# Patient Record
Sex: Male | Born: 1935 | Race: Black or African American | Hispanic: No | Marital: Married | Smoking: Former smoker
Health system: Southern US, Community
[De-identification: ages and names within clinical notes are randomized; demographics above are authoritative.]

## PROBLEM LIST (undated history)

## (undated) DIAGNOSIS — I5189 Other ill-defined heart diseases: Secondary | ICD-10-CM

## (undated) DIAGNOSIS — R55 Syncope and collapse: Secondary | ICD-10-CM

## (undated) DIAGNOSIS — I519 Heart disease, unspecified: Secondary | ICD-10-CM

## (undated) DIAGNOSIS — F99 Mental disorder, not otherwise specified: Secondary | ICD-10-CM

## (undated) DIAGNOSIS — F0390 Unspecified dementia without behavioral disturbance: Secondary | ICD-10-CM

## (undated) DIAGNOSIS — N400 Enlarged prostate without lower urinary tract symptoms: Secondary | ICD-10-CM

## (undated) DIAGNOSIS — I679 Cerebrovascular disease, unspecified: Secondary | ICD-10-CM

## (undated) DIAGNOSIS — I10 Essential (primary) hypertension: Secondary | ICD-10-CM

## (undated) HISTORY — PX: BREAST SURGERY: SHX581

---

## 1997-08-02 ENCOUNTER — Other Ambulatory Visit: Admission: RE | Admit: 1997-08-02 | Discharge: 1997-08-02 | Payer: Self-pay | Admitting: Internal Medicine

## 1998-05-21 ENCOUNTER — Emergency Department (HOSPITAL_COMMUNITY): Admission: EM | Admit: 1998-05-21 | Discharge: 1998-05-21 | Payer: Self-pay

## 1998-05-29 ENCOUNTER — Emergency Department (HOSPITAL_COMMUNITY): Admission: EM | Admit: 1998-05-29 | Discharge: 1998-05-29 | Payer: Self-pay | Admitting: Emergency Medicine

## 1998-07-31 ENCOUNTER — Emergency Department (HOSPITAL_COMMUNITY): Admission: EM | Admit: 1998-07-31 | Discharge: 1998-07-31 | Payer: Self-pay | Admitting: *Deleted

## 1998-08-07 ENCOUNTER — Emergency Department (HOSPITAL_COMMUNITY): Admission: EM | Admit: 1998-08-07 | Discharge: 1998-08-07 | Payer: Self-pay | Admitting: Emergency Medicine

## 1998-09-10 ENCOUNTER — Emergency Department (HOSPITAL_COMMUNITY): Admission: EM | Admit: 1998-09-10 | Discharge: 1998-09-10 | Payer: Self-pay

## 1999-05-02 ENCOUNTER — Emergency Department (HOSPITAL_COMMUNITY): Admission: EM | Admit: 1999-05-02 | Discharge: 1999-05-03 | Payer: Self-pay | Admitting: Emergency Medicine

## 1999-05-03 ENCOUNTER — Encounter: Payer: Self-pay | Admitting: Emergency Medicine

## 2006-01-01 ENCOUNTER — Ambulatory Visit (HOSPITAL_COMMUNITY): Admission: RE | Admit: 2006-01-01 | Discharge: 2006-01-01 | Payer: Self-pay | Admitting: Pediatrics

## 2006-01-10 ENCOUNTER — Ambulatory Visit (HOSPITAL_COMMUNITY): Admission: RE | Admit: 2006-01-10 | Discharge: 2006-01-10 | Payer: Self-pay | Admitting: Internal Medicine

## 2007-09-12 ENCOUNTER — Emergency Department (HOSPITAL_COMMUNITY): Admission: EM | Admit: 2007-09-12 | Discharge: 2007-09-12 | Payer: Self-pay | Admitting: Emergency Medicine

## 2008-12-06 ENCOUNTER — Emergency Department (HOSPITAL_COMMUNITY): Admission: EM | Admit: 2008-12-06 | Discharge: 2008-12-06 | Payer: Self-pay | Admitting: Emergency Medicine

## 2010-09-01 ENCOUNTER — Emergency Department (HOSPITAL_COMMUNITY)
Admission: EM | Admit: 2010-09-01 | Discharge: 2010-09-02 | Disposition: A | Discharge status: Home / Self Care | Payer: Medicare Other | Attending: Emergency Medicine | Admitting: Emergency Medicine

## 2010-09-01 ENCOUNTER — Emergency Department (HOSPITAL_COMMUNITY): Payer: Medicare Other

## 2010-09-01 DIAGNOSIS — R112 Nausea with vomiting, unspecified: Secondary | ICD-10-CM | POA: Insufficient documentation

## 2010-09-01 DIAGNOSIS — Z79899 Other long term (current) drug therapy: Secondary | ICD-10-CM | POA: Insufficient documentation

## 2010-09-01 DIAGNOSIS — R55 Syncope and collapse: Secondary | ICD-10-CM | POA: Insufficient documentation

## 2010-09-01 DIAGNOSIS — R0682 Tachypnea, not elsewhere classified: Secondary | ICD-10-CM | POA: Insufficient documentation

## 2010-09-01 DIAGNOSIS — F0280 Dementia in other diseases classified elsewhere without behavioral disturbance: Secondary | ICD-10-CM | POA: Insufficient documentation

## 2010-09-01 DIAGNOSIS — F068 Other specified mental disorders due to known physiological condition: Secondary | ICD-10-CM | POA: Insufficient documentation

## 2010-09-01 DIAGNOSIS — G309 Alzheimer's disease, unspecified: Secondary | ICD-10-CM | POA: Insufficient documentation

## 2010-09-01 DIAGNOSIS — Z8673 Personal history of transient ischemic attack (TIA), and cerebral infarction without residual deficits: Secondary | ICD-10-CM | POA: Insufficient documentation

## 2010-09-01 DIAGNOSIS — D649 Anemia, unspecified: Secondary | ICD-10-CM | POA: Insufficient documentation

## 2010-09-02 LAB — DIFFERENTIAL
Basophils Absolute: 0 10*3/uL (ref 0.0–0.1)
Basophils Relative: 0 % (ref 0–1)
Eosinophils Absolute: 0 10*3/uL (ref 0.0–0.7)
Eosinophils Relative: 0 % (ref 0–5)
Lymphocytes Relative: 27 % (ref 12–46)
Lymphs Abs: 0.8 K/uL (ref 0.7–4.0)
Monocytes Absolute: 0.4 K/uL (ref 0.1–1.0)
Monocytes Relative: 12 % (ref 3–12)
Neutro Abs: 1.9 10*3/uL (ref 1.7–7.7)
Neutrophils Relative %: 61 % (ref 43–77)

## 2010-09-02 LAB — POCT I-STAT, CHEM 8
Calcium, Ion: 1.12 mmol/L (ref 1.12–1.32)
Chloride: 102 mEq/L (ref 96–112)
HCT: 35 % — ABNORMAL LOW (ref 39.0–52.0)
Potassium: 3.2 mEq/L — ABNORMAL LOW (ref 3.5–5.1)
Sodium: 141 mEq/L (ref 135–145)

## 2010-09-02 LAB — URINALYSIS, ROUTINE W REFLEX MICROSCOPIC
Bilirubin Urine: NEGATIVE
Glucose, UA: NEGATIVE mg/dL
Hgb urine dipstick: NEGATIVE
Ketones, ur: NEGATIVE mg/dL
Leukocytes, UA: NEGATIVE
Nitrite: NEGATIVE
Protein, ur: NEGATIVE mg/dL
Specific Gravity, Urine: 1.02 (ref 1.005–1.030)
Urobilinogen, UA: 0.2 mg/dL (ref 0.0–1.0)
pH: 6 (ref 5.0–8.0)

## 2010-09-02 LAB — PROTIME-INR
INR: 0.97 (ref 0.00–1.49)
Prothrombin Time: 13.1 seconds (ref 11.6–15.2)

## 2010-09-02 LAB — POCT I-STAT TROPONIN I: Troponin i, poc: 0 ng/mL (ref 0.00–0.08)

## 2010-09-02 LAB — LIPASE, BLOOD: Lipase: 21 U/L (ref 11–59)

## 2010-09-02 LAB — COMPREHENSIVE METABOLIC PANEL WITH GFR
Albumin: 3.6 g/dL (ref 3.5–5.2)
Alkaline Phosphatase: 41 U/L (ref 39–117)
BUN: 18 mg/dL (ref 6–23)
Chloride: 103 meq/L (ref 96–112)
Creatinine, Ser: 0.98 mg/dL (ref 0.50–1.35)
GFR calc Af Amer: >60 mL/min (ref >60)
GFR calc non Af Amer: >60 mL/min (ref >60)
Glucose, Bld: 113 mg/dL — ABNORMAL HIGH (ref 70–99)
Potassium: 3.3 meq/L — ABNORMAL LOW (ref 3.5–5.1)
Total Bilirubin: 0.2 mg/dL — ABNORMAL LOW (ref 0.3–1.2)

## 2010-09-02 LAB — CBC
HCT: 30.3 % — ABNORMAL LOW (ref 39.0–52.0)
Hemoglobin: 10.1 g/dL — ABNORMAL LOW (ref 13.0–17.0)
MCH: 28.2 pg (ref 26.0–34.0)
MCHC: 33.3 g/dL (ref 30.0–36.0)
MCV: 84.6 fL (ref 78.0–100.0)
Platelets: 113 10*3/uL — ABNORMAL LOW (ref 150–400)
RBC: 3.58 MIL/uL — ABNORMAL LOW (ref 4.22–5.81)
RDW: 13.9 % (ref 11.5–15.5)
WBC: 3.1 K/uL — ABNORMAL LOW (ref 4.0–10.5)

## 2010-09-02 LAB — COMPREHENSIVE METABOLIC PANEL
ALT: 8 U/L (ref 0–53)
AST: 14 U/L (ref 0–37)
CO2: 28 mEq/L (ref 19–32)
Calcium: 9.6 mg/dL (ref 8.4–10.5)
Sodium: 140 mEq/L (ref 135–145)
Total Protein: 6.8 g/dL (ref 6.0–8.3)

## 2010-09-02 LAB — TROPONIN I: Troponin I: <0.3 ng/mL (ref <0.30)

## 2010-09-02 LAB — D-DIMER, QUANTITATIVE: D-Dimer, Quant: 0.41 ug{FEU}/mL (ref 0.00–0.48)

## 2010-09-02 LAB — APTT: aPTT: 32 seconds (ref 24–37)

## 2010-09-19 ENCOUNTER — Ambulatory Visit (HOSPITAL_COMMUNITY)
Admission: RE | Admit: 2010-09-19 | Discharge: 2010-09-19 | Disposition: A | Discharge status: Home / Self Care | Payer: Medicare Other | Source: Ambulatory Visit | Attending: Internal Medicine | Admitting: Internal Medicine

## 2010-09-19 ENCOUNTER — Ambulatory Visit (HOSPITAL_COMMUNITY): Payer: Medicare Other

## 2010-09-19 DIAGNOSIS — I359 Nonrheumatic aortic valve disorder, unspecified: Secondary | ICD-10-CM | POA: Insufficient documentation

## 2010-09-19 DIAGNOSIS — I6529 Occlusion and stenosis of unspecified carotid artery: Secondary | ICD-10-CM | POA: Insufficient documentation

## 2010-09-19 DIAGNOSIS — R0989 Other specified symptoms and signs involving the circulatory and respiratory systems: Secondary | ICD-10-CM

## 2010-09-19 DIAGNOSIS — I079 Rheumatic tricuspid valve disease, unspecified: Secondary | ICD-10-CM | POA: Insufficient documentation

## 2010-09-19 DIAGNOSIS — H3400 Transient retinal artery occlusion, unspecified eye: Secondary | ICD-10-CM | POA: Insufficient documentation

## 2010-09-19 DIAGNOSIS — I369 Nonrheumatic tricuspid valve disorder, unspecified: Secondary | ICD-10-CM

## 2010-12-10 ENCOUNTER — Encounter (HOSPITAL_COMMUNITY): Payer: Self-pay | Admitting: Pharmacy Technician

## 2010-12-11 ENCOUNTER — Encounter (HOSPITAL_COMMUNITY)
Admission: RE | Admit: 2010-12-11 | Discharge: 2010-12-11 | Disposition: A | Discharge status: Home / Self Care | Payer: Medicare Other | Source: Ambulatory Visit | Attending: Ophthalmology | Admitting: Ophthalmology

## 2010-12-11 ENCOUNTER — Encounter (HOSPITAL_COMMUNITY): Payer: Self-pay

## 2010-12-11 HISTORY — DX: Mental disorder, not otherwise specified: F99

## 2010-12-11 LAB — BASIC METABOLIC PANEL
CO2: 33 mEq/L — ABNORMAL HIGH (ref 19–32)
Calcium: 9.6 mg/dL (ref 8.4–10.5)
Sodium: 143 mEq/L (ref 135–145)

## 2010-12-11 LAB — SURGICAL PCR SCREEN: Staphylococcus aureus: NEGATIVE

## 2010-12-11 LAB — CBC
MCV: 87.1 fL (ref 78.0–100.0)
Platelets: 148 10*3/uL — ABNORMAL LOW (ref 150–400)
RBC: 3.87 MIL/uL — ABNORMAL LOW (ref 4.22–5.81)
WBC: 2.9 10*3/uL — ABNORMAL LOW (ref 4.0–10.5)

## 2010-12-11 NOTE — Pre-Procedure Instructions (Signed)
20 Cesar Maldonado  12/11/2010   Your procedure is scheduled on:12/19/10 Report to [facility] at900 AM.  Call this number if you have problems the morning of surgery: [phone]   Remember:   Do not eat food:After Midnight.  May have clear liquids: up to 4 Hours before arrival.  Clear liquids include soda, tea, black coffee, apple or grape juice, broth.  Take these medicines the morning of surgery with A SIP OF WATER:namendia*stop asa now   Do not wear jewelry, make-up or nail polish.  Do not wear lotions, powders, or perfumes. You may wear deodorant.  Do not shave 48 hours prior to surgery.  Do not bring valuables to the hospital.  Contacts, dentures or bridgework may not be worn into surgery.  Leave suitcase in the car. After surgery it may be brought to your room.  For patients admitted to the hospital, checkout time is 11:00 AM the day of discharge.   Patients discharged the day of surgery will not be allowed to drive home.  Name and phone number of your driver: delores will bring number  Special Instructions: CHG Shower Use Special Wash: 1/2 bottle night before surgery and 1/2 bottle morning of surgery.   Please read over the following fact sheets that you were given: Pain Booklet, Coughing and Deep Breathing, MRSA Information and Surgical Site Infection Prevention

## 2010-12-11 NOTE — Progress Notes (Addendum)
ekg, echo in epic from 8/12 when pat came to hospital due to blacking out. Also 1 view cxr. Neg results

## 2010-12-11 NOTE — Progress Notes (Signed)
Procedure on epic chart( heading) different than on consent order. Dr Clarisa Kindred called. Had to leave message on her phone.

## 2010-12-12 NOTE — Consult Note (Signed)
Anesthesia:  Patient is an 77 year old male for right eye vitrectomy.  Hx of Alzheimer's, left breast surgery, and syncope in August after a vomiting spell.  He ultimately underwent carotid duplex and echocardiogram.  Echo 09/19/10 showed EF 55%, normal LV wall motion, grade 1 diastolic dysfunction, trivial AR.  Carotid duplex showed no significant ICA stenosis.  EKG showed NSR.  He also had a 1V CXR on 09/02/10 showing no acute process.  Preoperative labs noted.  Plan to proceed.

## 2010-12-13 NOTE — Progress Notes (Signed)
Spoke with Sonya in OR, booking & consent one in the same.

## 2010-12-14 NOTE — H&P (Signed)
Pre-operative History and Physical for Ophthalmic Surgery  Bradd Canary Date of exam 11/21/2010              Chief Complaint: Decreased Vision Right Eye  Diagnosis: Central Retinal Vein Occlusion, Vitreous Hemorrhage, Cataract  No Known Allergies    Prior to Admission medications   Medication Sig Start Date End Date Taking? Authorizing Provider  aspirin EC 81 MG tablet Take 81 mg by mouth daily. TAKES IRREGULARLY     Historical Provider, MD  memantine (NAMENDA) 10 MG tablet Take 10 mg by mouth daily.      Historical Provider, MD  Tamsulosin HCl (FLOMAX) 0.4 MG CAPS Take 0.4 mg by mouth daily after supper. TAKES IRREGULARLY     Historical Provider, MD    Planned Procedure: Pars Plana Vitrectomy, Membrane Peeling and Endolaser                                      Phacoemulsification, Posterior Chamber Intra-ocular Lens  There were no vitals filed for this visit.  Pulse: 80        Resp:  18       ROS:  non-contributory   Past Medical History  Diagnosis Date  . Mental disorder     alzhiemers    Past Surgical History  Procedure Date  . Breast surgery     left tumor     History   Social History  . Marital Status: Married    Spouse Name: N/A    Number of Children: N/A  . Years of Education: N/A   Occupational History  . Not on file.   Social History Main Topics  . Smoking status: Never Smoker   . Smokeless tobacco: Not on file  . Alcohol Use: 1.2 oz/week    2 Shots of liquor per week  . Drug Use: No  . Sexually Active:    Other Topics Concern  . Not on file   Social History Narrative  . No narrative on file     The following examination is for anesthesia clearance for minimally invasive Ophthalmic surgery. It is primarily to document heart and lung findings and is not intended to elucidate unknown general medical conditions inclusive of abdominal masses, lung lesions, etc.   General Constitution:  within normal limits  Alertness/Orientation:   Person, time place     yes   HEENT:  Eye Findings: grossly within normal limits, reduced red reflex due to hemorrhage                   right eye  Neck: supple without masses   Chest/Lungs: clear to auscultation   Cardiac: Normal S1 and S2 without Murmur, S3 or S4   Neuro: non-focal   Impression: Visually significant chronic Vitreous Hemorrhage, Cataract Right Eye  Planned Procedure:  Pars Plana Vitrectomy, Membrane Peeling and Endolaser   Shade Flood, MD

## 2010-12-19 ENCOUNTER — Encounter (HOSPITAL_COMMUNITY): Payer: Self-pay | Admitting: *Deleted

## 2010-12-19 ENCOUNTER — Encounter (HOSPITAL_COMMUNITY)
Admission: RE | Disposition: A | Discharge status: Home / Self Care | Payer: Self-pay | Source: Ambulatory Visit | Attending: Ophthalmology

## 2010-12-19 ENCOUNTER — Ambulatory Visit (HOSPITAL_COMMUNITY)
Admission: RE | Admit: 2010-12-19 | Discharge: 2010-12-19 | Disposition: A | Discharge status: Home / Self Care | Payer: Medicare Other | Source: Ambulatory Visit | Attending: Ophthalmology | Admitting: Ophthalmology

## 2010-12-19 ENCOUNTER — Encounter (HOSPITAL_COMMUNITY): Payer: Self-pay | Admitting: Vascular Surgery

## 2010-12-19 ENCOUNTER — Ambulatory Visit (HOSPITAL_COMMUNITY): Payer: Medicare Other | Admitting: Vascular Surgery

## 2010-12-19 DIAGNOSIS — H269 Unspecified cataract: Secondary | ICD-10-CM | POA: Insufficient documentation

## 2010-12-19 DIAGNOSIS — H348192 Central retinal vein occlusion, unspecified eye, stable: Secondary | ICD-10-CM | POA: Insufficient documentation

## 2010-12-19 DIAGNOSIS — H4310 Vitreous hemorrhage, unspecified eye: Secondary | ICD-10-CM | POA: Insufficient documentation

## 2010-12-19 HISTORY — PX: PARS PLANA VITRECTOMY: SHX2166

## 2010-12-19 SURGERY — VITRECTOMY, PARS PLANA APPROACH, WITH IOL INSERTION OR REPLACEMENT
Anesthesia: Monitor Anesthesia Care | Site: Eye | Laterality: Right | Wound class: Clean

## 2010-12-19 MED ORDER — LIDOCAINE HCL 2 % IJ SOLN
INTRAMUSCULAR | Status: DC | PRN
  Administered 2010-12-19: 5 mL

## 2010-12-19 MED ORDER — FENTANYL CITRATE 0.05 MG/ML IJ SOLN
INTRAMUSCULAR | Status: DC | PRN
  Administered 2010-12-19 (×3): 25 ug via INTRAVENOUS

## 2010-12-19 MED ORDER — BSS IO SOLN: INTRAOCULAR | Status: DC | PRN

## 2010-12-19 MED ORDER — EPINEPHRINE HCL 1 MG/ML IJ SOLN
INTRAMUSCULAR | Status: DC | PRN
  Administered 2010-12-19: .3 mL

## 2010-12-19 MED ORDER — PROVISC 10 MG/ML IO SOLN
INTRAOCULAR | Status: DC | PRN
  Administered 2010-12-19: .85 mL via INTRAOCULAR

## 2010-12-19 MED ORDER — MUPIROCIN 2 % EX OINT
TOPICAL_OINTMENT | CUTANEOUS | Status: AC
  Filled 2010-12-19: qty 22

## 2010-12-19 MED ORDER — GATIFLOXACIN 0.5 % OP SOLN
1.0000 [drp] | OPHTHALMIC | Status: AC
  Administered 2010-12-19: 1 [drp] via OPHTHALMIC

## 2010-12-19 MED ORDER — BSS PLUS IO SOLN
INTRAOCULAR | Status: DC | PRN
  Administered 2010-12-19 (×2): 1 via OPHTHALMIC

## 2010-12-19 MED ORDER — PREDNISOLONE ACETATE 1 % OP SUSP
OPHTHALMIC | Status: AC
  Filled 2010-12-19: qty 5

## 2010-12-19 MED ORDER — HYPROMELLOSE (GONIOSCOPIC) 2.5 % OP SOLN
OPHTHALMIC | Status: DC | PRN
  Administered 2010-12-19: 1 [drp] via OPHTHALMIC

## 2010-12-19 MED ORDER — NA CHONDROIT SULF-NA HYALURON 40-30 MG/ML IO SOLN
INTRAOCULAR | Status: DC | PRN
  Administered 2010-12-19: 0.5 mL via INTRAOCULAR

## 2010-12-19 MED ORDER — DEXAMETHASONE SODIUM PHOSPHATE 10 MG/ML IJ SOLN
INTRAMUSCULAR | Status: DC | PRN
  Administered 2010-12-19: 10 mg

## 2010-12-19 MED ORDER — HOMATROPINE HBR 2 % OP SOLN
1.0000 [drp] | OPHTHALMIC | Status: AC
  Administered 2010-12-19: 1 [drp] via OPHTHALMIC

## 2010-12-19 MED ORDER — TETRACAINE HCL 0.5 % OP SOLN
OPHTHALMIC | Status: AC
  Administered 2010-12-19: 2 [drp] via OPHTHALMIC
  Filled 2010-12-19: qty 2

## 2010-12-19 MED ORDER — LABETALOL HCL 5 MG/ML IV SOLN
INTRAVENOUS | Status: DC | PRN
  Administered 2010-12-19 (×3): 5 mg via INTRAVENOUS

## 2010-12-19 MED ORDER — BUPIVACAINE HCL 0.75 % IJ SOLN
INTRAMUSCULAR | Status: DC | PRN
  Administered 2010-12-19: 5 mL

## 2010-12-19 MED ORDER — CEFAZOLIN SUBCONJUNCTIVAL INJECTION 100 MG/0.5 ML
INJECTION | SUBCONJUNCTIVAL | Status: AC | PRN
  Administered 2010-12-19: 1 mL via SUBCONJUNCTIVAL

## 2010-12-19 MED ORDER — CEFAZOLIN SUBCONJUNCTIVAL INJECTION 100 MG/0.5 ML
200.0000 mg | INJECTION | SUBCONJUNCTIVAL | Status: DC
  Filled 2010-12-19: qty 1

## 2010-12-19 MED ORDER — BACITRACIN-POLYMYXIN B 500-10000 UNIT/GM OP OINT
TOPICAL_OINTMENT | OPHTHALMIC | Status: DC | PRN
  Administered 2010-12-19: 1 via OPHTHALMIC

## 2010-12-19 MED ORDER — GATIFLOXACIN 0.5 % OP SOLN
OPHTHALMIC | Status: AC
  Filled 2010-12-19: qty 2.5

## 2010-12-19 MED ORDER — EPINEPHRINE HCL 1 MG/ML IJ SOLN
INTRAOCULAR | Status: AC | PRN
  Administered 2010-12-19: 11:00:00

## 2010-12-19 MED ORDER — ONDANSETRON HCL 4 MG/2ML IJ SOLN
INTRAMUSCULAR | Status: DC | PRN
  Administered 2010-12-19: 4 mg via INTRAVENOUS

## 2010-12-19 MED ORDER — HOMATROPINE HBR 2 % OP SOLN
1.0000 [drp] | OPHTHALMIC | Status: DC
  Filled 2010-12-19: qty 5

## 2010-12-19 MED ORDER — BSS IO SOLN
INTRAOCULAR | Status: AC | PRN
  Administered 2010-12-19: 500 mL via INTRAOCULAR

## 2010-12-19 MED ORDER — BEVACIZUMAB INTRAVITREAL INJECTION 2.5 MG/0.5 ML
1.2500 mg | Freq: Once | INTRAVITREAL | Status: DC
  Filled 2010-12-19: qty 0.1

## 2010-12-19 MED ORDER — HOMATROPINE HBR 2 % OP SOLN: 1.0000 [drp] | OPHTHALMIC | Status: DC

## 2010-12-19 MED ORDER — MIDAZOLAM HCL 5 MG/5ML IJ SOLN
INTRAMUSCULAR | Status: DC | PRN
  Administered 2010-12-19 (×2): 0.5 mg via INTRAVENOUS

## 2010-12-19 MED ORDER — PHENYLEPHRINE HCL 2.5 % OP SOLN: 1.0000 [drp] | OPHTHALMIC | Status: DC

## 2010-12-19 MED ORDER — PHENYLEPHRINE HCL 2.5 % OP SOLN
1.0000 [drp] | OPHTHALMIC | Status: AC
  Administered 2010-12-19: 1 [drp] via OPHTHALMIC

## 2010-12-19 MED ORDER — PROPOFOL 10 MG/ML IV EMUL
INTRAVENOUS | Status: DC | PRN
  Administered 2010-12-19: 30 mg via INTRAVENOUS

## 2010-12-19 MED ORDER — GATIFLOXACIN 0.5 % OP SOLN: 1.0000 [drp] | OPHTHALMIC | Status: DC

## 2010-12-19 MED ORDER — SODIUM CHLORIDE 0.9 % IV SOLN
INTRAVENOUS | Status: DC | PRN
  Administered 2010-12-19 (×2): via INTRAVENOUS

## 2010-12-19 MED ORDER — TETRACAINE HCL 0.5 % OP SOLN
2.0000 [drp] | OPHTHALMIC | Status: AC
  Administered 2010-12-19: 2 [drp] via OPHTHALMIC

## 2010-12-19 MED ORDER — PHENYLEPHRINE HCL 2.5 % OP SOLN
OPHTHALMIC | Status: AC
  Filled 2010-12-19: qty 3

## 2010-12-19 MED ORDER — PREDNISOLONE ACETATE 1 % OP SUSP
1.0000 [drp] | OPHTHALMIC | Status: AC
  Administered 2010-12-19: 1 [drp] via OPHTHALMIC

## 2010-12-19 MED ORDER — PREDNISOLONE ACETATE 1 % OP SUSP: 1.0000 [drp] | OPHTHALMIC | Status: DC

## 2010-12-19 MED ORDER — TETRACAINE HCL 0.5 % OP SOLN: 2.0000 [drp] | OPHTHALMIC | Status: DC

## 2010-12-19 SURGICAL SUPPLY — 60 items
APPLICATOR COTTON TIP 6IN STRL (MISCELLANEOUS) ×2 IMPLANT
APPLICATOR DR MATTHEWS STRL (MISCELLANEOUS) IMPLANT
BLADE EYE MINI 60D BEAVER (BLADE) IMPLANT
BLADE KERATOME 2.75 (BLADE) ×2 IMPLANT
BLADE MVR KNIFE 19G (BLADE) IMPLANT
BLADE STAB KNIFE 15DEG (BLADE) ×2 IMPLANT
CLOTH BEACON ORANGE TIMEOUT ST (SAFETY) ×2 IMPLANT
CORDS BIPOLAR (ELECTRODE) ×2 IMPLANT
DRAPE OPHTHALMIC 77X100 STRL (CUSTOM PROCEDURE TRAY) ×2 IMPLANT
DRAPE POUCH INSTRU U-SHP 10X18 (DRAPES) ×2 IMPLANT
DRSG TEGADERM 4X4.75 (GAUZE/BANDAGES/DRESSINGS) ×2 IMPLANT
EYE SHIELD UNIVERSAL CLEAR (GAUZE/BANDAGES/DRESSINGS) ×2 IMPLANT
FILTER BLUE MILLIPORE (MISCELLANEOUS) IMPLANT
GAS OPHTHALMIC (MISCELLANEOUS) IMPLANT
GLOVE SS BIOGEL STRL SZ 6.5 (GLOVE) ×2 IMPLANT
GLOVE SUPERSENSE BIOGEL SZ 6.5 (GLOVE) ×2
GLOVE SURG SS PI 6.5 STRL IVOR (GLOVE) ×2 IMPLANT
GOWN STRL NON-REIN LRG LVL3 (GOWN DISPOSABLE) ×4 IMPLANT
ILLUMINATOR WIDEFIELD DIFF (MISCELLANEOUS) ×2 IMPLANT
KIT ROOM TURNOVER OR (KITS) ×2 IMPLANT
KNIFE CRESCENT 1.75 EDGEAHEAD (BLADE) IMPLANT
KNIFE GRIESHABER SHARP 2.5MM (MISCELLANEOUS) ×2 IMPLANT
LENS IOL ACRYSOF MP POST 19.5 (Intraocular Lens) ×2 IMPLANT
MARKER SKIN DUAL TIP RULER LAB (MISCELLANEOUS) ×2 IMPLANT
NEEDLE 18GX1X1/2 (RX/OR ONLY) (NEEDLE) ×2 IMPLANT
NEEDLE 22X1 1/2 (OR ONLY) (NEEDLE) ×2 IMPLANT
NEEDLE 25GX 5/8IN NON SAFETY (NEEDLE) ×2 IMPLANT
NEEDLE HYPO 30X.5 LL (NEEDLE) ×4 IMPLANT
NS IRRIG 1000ML POUR BTL (IV SOLUTION) ×2 IMPLANT
PACK VITRECTOMY CUSTOM (CUSTOM PROCEDURE TRAY) ×2 IMPLANT
PACK VITRECTOMY PIC MCHSVP (PACKS) IMPLANT
PAD ARMBOARD 7.5X6 YLW CONV (MISCELLANEOUS) ×4 IMPLANT
PAD EYE OVAL STERILE LF (GAUZE/BANDAGES/DRESSINGS) ×2 IMPLANT
PAK VITRECTOMY PIK  23GA (OPHTHALMIC RELATED) ×2 IMPLANT
PHACO TIP KELMAN 45DEG (TIP) ×2 IMPLANT
PROBE DIRECTIONAL LASER (MISCELLANEOUS) ×2 IMPLANT
ROLLS DENTAL (MISCELLANEOUS) ×4 IMPLANT
SCRAPER DIAMOND DUST MEMBRANE (MISCELLANEOUS) IMPLANT
SET VGFI TUBING 8065808002 (SET/KITS/TRAYS/PACK) IMPLANT
SHUTTLE MONARCH TYPE A (NEEDLE) ×2 IMPLANT
SOLUTION ANTI FOG 6CC (MISCELLANEOUS) ×2 IMPLANT
SPEAR EYE SURG WECK-CEL (MISCELLANEOUS) ×6 IMPLANT
SUT ETHILON 10 0 CS140 6 (SUTURE) ×2 IMPLANT
SUT ETHILON 4 0 P 3 18 (SUTURE) ×2 IMPLANT
SUT ETHILON 5 0 P 3 18 (SUTURE)
SUT ETHILON 9 0 TG140 8 (SUTURE) ×2 IMPLANT
SUT NYLON 10.0 BLK (SUTURE) ×2 IMPLANT
SUT NYLON ETHILON 5-0 P-3 1X18 (SUTURE) IMPLANT
SUT PLAIN 6 0 TG1408 (SUTURE) ×2 IMPLANT
SUT POLY NON ABSORB 10-0 8 STR (SUTURE) IMPLANT
SUT VICRYL 7 0 TG140 8 (SUTURE) IMPLANT
SUT VICRYL ABS 6-0 S29 18IN (SUTURE) ×2 IMPLANT
SYR 20CC LL (SYRINGE) ×2 IMPLANT
SYR 50ML LL SCALE MARK (SYRINGE) IMPLANT
SYR 5ML LL (SYRINGE) IMPLANT
SYR TB 1ML LUER SLIP (SYRINGE) ×2 IMPLANT
SYRINGE 10CC LL (SYRINGE) IMPLANT
TOWEL OR 17X24 6PK STRL BLUE (TOWEL DISPOSABLE) ×6 IMPLANT
WATER STERILE IRR 1000ML POUR (IV SOLUTION) ×2 IMPLANT
WIPE INSTRUMENT VISIWIPE 73X73 (MISCELLANEOUS) ×2 IMPLANT

## 2010-12-19 NOTE — Anesthesia Preprocedure Evaluation (Addendum)
Anesthesia Evaluation  Patient identified by MRN, date of birth, ID band Patient awake    Reviewed: Allergy & Precautions, H&P , NPO status , Patient's Chart, lab work & pertinent test results  History of Anesthesia Complications Negative for: history of anesthetic complications  Airway Mallampati: II  Neck ROM: full    Dental   Pulmonary          Cardiovascular Regular Normal ECHO 09/19/10 EF 55%; trivial aortic regurg   Neuro/Psych PSYCHIATRIC DISORDERS    GI/Hepatic   Endo/Other    Renal/GU      Musculoskeletal   Abdominal   Peds  Hematology   Anesthesia Other Findings   Reproductive/Obstetrics                          Anesthesia Physical Anesthesia Plan  ASA: II  Anesthesia Plan: MAC   Post-op Pain Management:    Induction: Intravenous  Airway Management Planned: Nasal Cannula  Additional Equipment:   Intra-op Plan:   Post-operative Plan:   Informed Consent: I have reviewed the patients History and Physical, chart, labs and discussed the procedure including the risks, benefits and alternatives for the proposed anesthesia with the patient or authorized representative who has indicated his/her understanding and acceptance.     Plan Discussed with: CRNA and Surgeon  Anesthesia Plan Comments:         Anesthesia Quick Evaluation

## 2010-12-19 NOTE — Transfer of Care (Signed)
Immediate Anesthesia Transfer of Care Note  Patient: Cesar Maldonado  Procedure(s) Performed:  PARS PLANA VITRECTOMY WITH INTRAOCULAR LENS - PARS PLANA VITRECTOMY WITH INTRAOCULAR LENS [900]membrane peel,endolaser right eye  Patient Location: PACU  Anesthesia Type: MAC  Level of Consciousness: awake  Airway & Oxygen Therapy: Patient Spontanous Breathing  Post-op Assessment: Report given to PACU RN and Post -op Vital signs reviewed and stable  Post vital signs: stable  Complications: No apparent anesthesia complications

## 2010-12-19 NOTE — Brief Op Note (Signed)
12/19/2010  12:19 PM  PATIENT:  Cesar Maldonado  77 y.o. male  PRE-OPERATIVE DIAGNOSIS:  Central Retinal Vein Occlusion, Vitreous Hemorrhage , Cataract Right Eye  POST-OPERATIVE DIAGNOSIS:  Same  PROCEDURE:  Procedure(s): PARS PLANA VITRECTOMY, Endolasesr, Phacoemulsification  , Posterior Chamber Intraocular lens  SURGEON: Shade Flood, MD   PHYSICIAN ASSISTANT: None  ASSISTANTS: none   ANESTHESIA:   Monitored Anesthesia Care  EBL:  Total I/O In: 500 [I.V.:500] Out: -   BLOOD ADMINISTERED:none  DRAINS: none   LOCAL MEDICATIONS USED:  MARCAINE 0/75%               7 CC, retrobulbar block  SPECIMEN:  No Specimen  DISPOSITION OF SPECIMEN:  N/A  COUNTS:  YES  TOURNIQUET:  * No tourniquets in log *  DICTATION: .Note written in EPIC  PLAN OF CARE: Discharge to home after PACU  PATIENT DISPOSITION:  Short Stay   Delay start of Pharmacological VTE agent (>24hrs) due to surgical blood loss or risk of bleeding:  No, not applicable

## 2010-12-19 NOTE — Op Note (Signed)
NAMERAEQUAN, VANSCHAICK NO.:  [id number]  MEDICAL RECORD NO.:  [id number]  LOCATION:  [facility]                         FACILITY:  [facility]  PHYSICIAN:  Shade Flood, MD       DATE OF BIRTH:  1924-09-28  DATE OF PROCEDURE: DATE OF DISCHARGE:  12/19/2010                              OPERATIVE REPORT   PREOPERATIVE DIAGNOSIS:  Central retinal vein occlusion, with dense vitreous hemorrhage, right eye.  SECONDARY DIAGNOSIS:  Cataract right eye.  PROCEDURES PERFORMED:  Pars plana vitrectomy, endolaser, right eye, and phacoemulsification with posterior chamber intraocular lens, right eye.  SURGEON:  Shade Flood, MD  ESTIMATED BLOOD LOSS:  Less than 1 mL.  COMPLICATIONS:  None.  SPECIMENS:  No specimens for pathology.  DESCRIPTION FOR PROCEDURE:  The patient was prepared and draped in the usual fashion for ocular surgery on the right eye, and a solid lid speculum was placed.  An infusion cannula was placed at the 7:30 meridian, 3.5 mm posterior to the surgical limbus.  The tip of the cannula could not be visualized.  A trocar cannula was used to place the infusion line at the 7:30 meridian.  The infusion line was inspected and secured, allowed to run and then clamped as it was placed in the cannula.  This was secured to the drape to ensure it could not inadvertently be pulled from the eye.  We turned our attention to the cataract portion of the procedure.  A peripheral clear corneal incision was made centered at the 11 o'clock meridian for 3.5 mm.  A 15-degree blade was used to enter the anterior chamber at the 2:30 meridian, and viscoelastic Provisc was placed in the anterior chamber.  A keratome blade was then used to enlarge the wound at the 11 o'clock meridian, and a bent 25-gauge needle was used to perform a capsulorrhexis.  The phaco handpiece was then used in a combined phaco chop technique, removing the lens nucleus uneventfully.  Cortex was then removed to the  extent that we could adequately visualize.  Due to the dense vitreous hemorrhage, I elected to discontinue the cataract procedure at that point, until we got the blood from the posterior chamber where we would have a more visible red reflex.  A single nylon suture was placed in the superior limb.  Trocar cannulas were then placed at 9:30 and 2:30 and a light pipe and vitreous cutter were inserted.  Core vitrectomy was used to remove the dense vitreous hemorrhage which obscured the view of the retina initially.  There was dense dehemoglobinized vitreous skirt for 360 degrees.  No tears were noted.  There was 1 location of retinal neovascularization superior to the arcade at approximately the 1 o'clock meridian.  The patient had multiple areas of whitened occluded peripheral vessels for 360 degrees.  Endolaser was then applied for over a 1000 applications in a peripheral panretinal photocoagulation pattern. The patient had a large myopic crescent, but the macula itself appeared unremarkable.  The patient had an optociliary shunt vessel at the optic nerve which indicates some duration of the vein occlusion being present. After completion of endolaser, the cannulas were removed at 9:30 and 2:30,  and the infusion line was clamped.  The 10-0 nylon suture was removed, and the Monarch injector was used to place the AcrySof MA50BM lens into the posterior capsule bag.  It appeared that the inferior nasal quadrant of the capsule may not be well-supported as the lens had a slight tilt in that direction.  I elected to reposit the haptics into the sulcus anterior to the anterior capsule to ensure adequate lens mobility, and then a purposeful opening was made in the posterior capsule just behind the intraocular lens at the 2 o'clock meridian, and this was used to remove a small amount of residual cortex from the capsule bag.  After that was removed, the intraocular lens was quite stable, and the 10-0  nylon suture was replaced to ensure that the wound did not have a leak.  The patient was given 4 mg of Decadron subconjunctivally and 100 mg of Ancef subconjunctivally with the needle directly visualized.  The lid speculum was removed.  The patient's eye was patched using bacitracin and Neosporin ophthalmic ointment, and a plastic shield was placed.  He was transferred alert and conversant from the operating room to the postoperative recovery area.          ______________________________ Shade Flood, MD     GG/MEDQ  D:  12/19/2010  T:  12/19/2010  Job:  [id number]

## 2010-12-19 NOTE — Interval H&P Note (Signed)
History and Physical Interval Note:  12/19/2010 10:09 AM  Cesar Maldonado  has presented today for surgery, with the diagnosis of central retinal occlusion hemmorhage cataract  The various methods of treatment have been discussed with the patient and family. After consideration of risks, benefits and other options for treatment, the patient has consented to  Procedure(s): PARS PLANA VITRECTOMY WITH INTRAOCULAR LENS as a surgical intervention .  The patients' history has been reviewed, patient examined, no change in status, stable for surgery.  I have reviewed the patients' chart and labs.  Questions were answered to the patient's satisfaction.     Danyal Adorno, Waynette Buttery

## 2010-12-19 NOTE — Op Note (Signed)
Dictated to for transcription.   Job number [id number] Shade Flood MD

## 2010-12-19 NOTE — Preoperative (Signed)
Beta Blockers   Reason not to administer Beta Blockers:Not Applicable 

## 2010-12-19 NOTE — Anesthesia Postprocedure Evaluation (Signed)
Anesthesia Post Note  Patient: Cesar Maldonado  Procedure(s) Performed:  PARS PLANA VITRECTOMY WITH INTRAOCULAR LENS - PARS PLANA VITRECTOMY WITH INTRAOCULAR LENS [900]membrane peel,endolaser right eye  Anesthesia type: General  Patient location: MAC  Post pain: Pain level controlled and Adequate analgesia  Post assessment: Post-op Vital signs reviewed, Patient's Cardiovascular Status Stable, Respiratory Function Stable, Patent Airway and Pain level controlled  Last Vitals:  Filed Vitals:   12/19/10 1237  BP:   Pulse: 62  Temp: 36 C  Resp: 16    Post vital signs: Reviewed and stable  Level of consciousness: awake, alert  and oriented  Complications: No apparent anesthesia complications

## 2010-12-20 MED FILL — Mupirocin Oint 2%: CUTANEOUS | Qty: 22 | Status: AC

## 2010-12-25 ENCOUNTER — Encounter (HOSPITAL_COMMUNITY): Payer: Self-pay | Admitting: Ophthalmology

## 2012-02-24 ENCOUNTER — Emergency Department (HOSPITAL_COMMUNITY): Payer: Medicare Other

## 2012-02-24 ENCOUNTER — Inpatient Hospital Stay (HOSPITAL_COMMUNITY)
Admission: EM | Admit: 2012-02-24 | Discharge: 2012-02-26 | DRG: 312 | Disposition: A | Discharge status: Home / Self Care | Payer: Medicare Other | Attending: Internal Medicine | Admitting: Internal Medicine

## 2012-02-24 ENCOUNTER — Encounter (HOSPITAL_COMMUNITY): Payer: Self-pay | Admitting: Emergency Medicine

## 2012-02-24 ENCOUNTER — Inpatient Hospital Stay (HOSPITAL_COMMUNITY): Payer: Medicare Other

## 2012-02-24 DIAGNOSIS — I5189 Other ill-defined heart diseases: Secondary | ICD-10-CM | POA: Diagnosis present

## 2012-02-24 DIAGNOSIS — N400 Enlarged prostate without lower urinary tract symptoms: Secondary | ICD-10-CM | POA: Diagnosis present

## 2012-02-24 DIAGNOSIS — I519 Heart disease, unspecified: Secondary | ICD-10-CM | POA: Diagnosis present

## 2012-02-24 DIAGNOSIS — G309 Alzheimer's disease, unspecified: Secondary | ICD-10-CM | POA: Diagnosis present

## 2012-02-24 DIAGNOSIS — F0280 Dementia in other diseases classified elsewhere without behavioral disturbance: Secondary | ICD-10-CM | POA: Diagnosis present

## 2012-02-24 DIAGNOSIS — I10 Essential (primary) hypertension: Secondary | ICD-10-CM | POA: Diagnosis present

## 2012-02-24 DIAGNOSIS — I679 Cerebrovascular disease, unspecified: Secondary | ICD-10-CM

## 2012-02-24 DIAGNOSIS — R55 Syncope and collapse: Principal | ICD-10-CM | POA: Diagnosis present

## 2012-02-24 DIAGNOSIS — Z79899 Other long term (current) drug therapy: Secondary | ICD-10-CM

## 2012-02-24 DIAGNOSIS — F0390 Unspecified dementia without behavioral disturbance: Secondary | ICD-10-CM

## 2012-02-24 DIAGNOSIS — I6789 Other cerebrovascular disease: Secondary | ICD-10-CM | POA: Diagnosis present

## 2012-02-24 DIAGNOSIS — E860 Dehydration: Secondary | ICD-10-CM | POA: Diagnosis present

## 2012-02-24 HISTORY — DX: Other ill-defined heart diseases: I51.89

## 2012-02-24 HISTORY — DX: Cerebrovascular disease, unspecified: I67.9

## 2012-02-24 HISTORY — DX: Syncope and collapse: R55

## 2012-02-24 HISTORY — DX: Essential (primary) hypertension: I10

## 2012-02-24 HISTORY — DX: Benign prostatic hyperplasia without lower urinary tract symptoms: N40.0

## 2012-02-24 HISTORY — DX: Heart disease, unspecified: I51.9

## 2012-02-24 HISTORY — DX: Unspecified dementia, unspecified severity, without behavioral disturbance, psychotic disturbance, mood disturbance, and anxiety: F03.90

## 2012-02-24 LAB — COMPREHENSIVE METABOLIC PANEL
Albumin: 3.5 g/dL (ref 3.5–5.2)
Alkaline Phosphatase: 47 U/L (ref 39–117)
CO2: 26 mEq/L (ref 19–32)
Calcium: 9.6 mg/dL (ref 8.4–10.5)
GFR calc Af Amer: 57 mL/min — ABNORMAL LOW (ref >90)
Sodium: 135 mEq/L (ref 135–145)
Total Bilirubin: 0.3 mg/dL (ref 0.3–1.2)

## 2012-02-24 LAB — CBC WITH DIFFERENTIAL/PLATELET
Basophils Absolute: 0 10*3/uL (ref 0.0–0.1)
Eosinophils Relative: 0 % (ref 0–5)
Lymphocytes Relative: 21 % (ref 12–46)
MCV: 85.9 fL (ref 78.0–100.0)
Neutro Abs: 2.9 10*3/uL (ref 1.7–7.7)
Neutrophils Relative %: 69 % (ref 43–77)
Platelets: 152 10*3/uL (ref 150–400)
RDW: 14.9 % (ref 11.5–15.5)
WBC: 4.2 10*3/uL (ref 4.0–10.5)

## 2012-02-24 LAB — URINALYSIS, ROUTINE W REFLEX MICROSCOPIC
Bilirubin Urine: NEGATIVE
Ketones, ur: NEGATIVE mg/dL
Leukocytes, UA: NEGATIVE
Nitrite: NEGATIVE
Urobilinogen, UA: 0.2 mg/dL (ref 0.0–1.0)
pH: 6 (ref 5.0–8.0)

## 2012-02-24 LAB — MRSA PCR SCREENING: MRSA by PCR: NEGATIVE

## 2012-02-24 LAB — TROPONIN I: Troponin I: <0.3 ng/mL (ref <0.30)

## 2012-02-24 MED ORDER — ASPIRIN EC 81 MG PO TBEC
81.0000 mg | DELAYED_RELEASE_TABLET | Freq: Every day | ORAL | Status: DC
  Administered 2012-02-24 – 2012-02-26 (×3): 81 mg via ORAL
  Filled 2012-02-24 (×3): qty 1

## 2012-02-24 MED ORDER — ONDANSETRON HCL 4 MG/2ML IJ SOLN: 4.0000 mg | Freq: Once | INTRAMUSCULAR | Status: AC

## 2012-02-24 MED ORDER — SODIUM CHLORIDE 0.9 % IV SOLN
INTRAVENOUS | Status: DC
  Administered 2012-02-24: 16:00:00 via INTRAVENOUS

## 2012-02-24 MED ORDER — ACETAMINOPHEN 325 MG PO TABS: 650.0000 mg | ORAL_TABLET | Freq: Four times a day (QID) | ORAL | Status: DC | PRN

## 2012-02-24 MED ORDER — SODIUM CHLORIDE 0.9 % IJ SOLN
3.0000 mL | Freq: Two times a day (BID) | INTRAMUSCULAR | Status: DC
  Administered 2012-02-24 – 2012-02-25 (×2): 3 mL via INTRAVENOUS

## 2012-02-24 MED ORDER — ONDANSETRON HCL 4 MG PO TABS: 4.0000 mg | ORAL_TABLET | Freq: Four times a day (QID) | ORAL | Status: DC | PRN

## 2012-02-24 MED ORDER — MEMANTINE HCL 10 MG PO TABS
10.0000 mg | ORAL_TABLET | Freq: Every day | ORAL | Status: DC
  Administered 2012-02-25 – 2012-02-26 (×2): 10 mg via ORAL
  Filled 2012-02-24 (×2): qty 1

## 2012-02-24 MED ORDER — TAMSULOSIN HCL 0.4 MG PO CAPS
0.4000 mg | ORAL_CAPSULE | Freq: Every day | ORAL | Status: DC
  Administered 2012-02-25: 0.4 mg via ORAL
  Filled 2012-02-24 (×2): qty 1

## 2012-02-24 MED ORDER — ENOXAPARIN SODIUM 30 MG/0.3ML ~~LOC~~ SOLN
30.0000 mg | SUBCUTANEOUS | Status: DC
  Administered 2012-02-24 – 2012-02-25 (×2): 30 mg via SUBCUTANEOUS
  Filled 2012-02-24 (×4): qty 0.3

## 2012-02-24 MED ORDER — ONDANSETRON HCL 4 MG/2ML IJ SOLN
INTRAMUSCULAR | Status: AC
  Administered 2012-02-24: 4 mg
  Filled 2012-02-24: qty 2

## 2012-02-24 MED ORDER — ONDANSETRON HCL 4 MG/2ML IJ SOLN: 4.0000 mg | Freq: Four times a day (QID) | INTRAMUSCULAR | Status: DC | PRN

## 2012-02-24 MED ORDER — ACETAMINOPHEN 650 MG RE SUPP: 650.0000 mg | Freq: Four times a day (QID) | RECTAL | Status: DC | PRN

## 2012-02-24 MED ORDER — ALUM & MAG HYDROXIDE-SIMETH 200-200-20 MG/5ML PO SUSP: 30.0000 mL | Freq: Four times a day (QID) | ORAL | Status: DC | PRN

## 2012-02-24 MED ORDER — SODIUM CHLORIDE 0.9 % IV SOLN
INTRAVENOUS | Status: DC
  Administered 2012-02-24 – 2012-02-25 (×2): via INTRAVENOUS

## 2012-02-24 NOTE — ED Notes (Signed)
Per ems pt was not feeling well and was in bed.  Wife reported that pt rolled over and was not responding.  Pts son started compressions until ems arrived.  Once on scene pt was sweaty, cold to touch and had vomited on self.  He was not concious on arrival.  Original systolic BP 60.  CBG 118, Pt denies CP, SOB.  Lungs clear.  Pt has history of alzheimers and hypertension.  Started taking new BP med 2 dats ago. Last EMS BP 116/70 Pt currently alert X4 even with history of alz, he is answering all questions appropriatly.

## 2012-02-24 NOTE — ED Notes (Signed)
Pt actively vomiting.  Pt given 4mg  zofran and.  Pt bedding cleaned and pt reports being more comfortable

## 2012-02-24 NOTE — H&P (Addendum)
[facility] History and Physical  [name]:[id number] DOB: October 27, 1924 DOA: 02/24/2012  Referring physician: Dr. Carmell Austria PCP: Laurena Slimmer, MD   Chief Complaint: "They say I fell out"   History of Present Illness: Cesar Maldonado is an 79 y.o. male with a PMH of Alzheimer's dementia and HTN, who suddenly became unresponsive while sitting on bed watching TV.  A review of the patient's chart indicates that he has had syncope after vomiting spells in the in 2012. He ultimately underwent carotid duplex and echocardiogram in September 2012. Echo 09/19/10 showed EF 55%, normal LV wall motion, grade 1 diastolic dysfunction, trivial AR. Carotid duplex showed no significant ICA stenosis. According to Dr. Richardean Chimera notes, the patient's son could not find a pulse and started CPR before EMS arrived.  EMS personnel were able to feel a pulse and reported BP was 60 mmHg ststolic.  Regained consciousness when EMS arrived. The patient is relatively asymptomatic. He only complains of feeling a little bit cold. He had a few episodes of nausea and vomiting as well. No family is currently present for me to get additional history. Upon initial evaluation emergency department, the patient's vital signs are stable, he is afebrile.  Review of Systems: Constitutional: No fever, + chills;  Appetite normal; No weight loss, no weight gain.  HEENT: No blurry vision, no diplopia, no pharyngitis, no dysphagia CV: No chest pain, no palpitations.  Resp: No SOB, occasional cough. GI: + nausea and vomiting, no diarrhea, no melena, no hematochezia.  GU: No dysuria, no hematuria.  MSK: no myalgias, no arthralgias.  Neuro:  No headache, no focal neurological deficits, no history of seizures.  Psych: No depression, no anxiety.  Endo: No thyroid disease, no DM, no heat intolerance, no cold intolerance, no polyuria, no polydipsia  Skin: No rashes, no skin lesions.  Heme: No easy bruising, no history of blood  diseases.  Past Medical History Past Medical History  Diagnosis Date  . Mental disorder     alzhiemers  . Hypertension   . Dementia   . BPH (benign prostatic hyperplasia)   . Syncope   . Left ventricular diastolic dysfunction, NYHA class 1   . Cerebrovascular disease     Chronic ischemic microvascular white matter disease and central atrophy on CT scan     Past Surgical History Past Surgical History  Procedure Laterality Date  . Breast surgery      left tumor  . Pars plana vitrectomy  12/19/2010    Procedure: PARS PLANA VITRECTOMY WITH INTRAOCULAR LENS;  Surgeon: Shade Flood, MD;  Location: [facility] OR;  Service: Ophthalmology;  Laterality: Right;  PARS PLANA VITRECTOMY WITH INTRAOCULAR LENS [900]membrane peel,endolaser right eye     Social History: History   Social History  . Marital Status: Married    Spouse Name: [name]    Number of Children: N/A  . Years of Education: N/A   Occupational History  . Retired, plaster work    Social History Main Topics  . Smoking status: Never Smoker   . Smokeless tobacco: Not on file  . Alcohol Use: 1.2 oz/week    2 Shots of liquor per week  . Drug Use: No  . Sexually Active: Not on file   Other Topics Concern  . Not on file   Social History Narrative   Married.  Lives with wife.  Ambulates independently.    Family History:  No family history on file.  Allergies: Review of patient's allergies indicates no known allergies.  Meds: Prior to Admission medications   Medication Sig Start Date End Date Taking? Authorizing Provider  memantine (NAMENDA) 10 MG tablet Take 10 mg by mouth daily.     Yes Historical Provider, MD  aspirin EC 81 MG tablet Take 81 mg by mouth daily. TAKES IRREGULARLY     Historical Provider, MD  Tamsulosin HCl (FLOMAX) 0.4 MG CAPS Take 0.4 mg by mouth daily after supper. TAKES IRREGULARLY     Historical Provider, MD    Physical Exam: Filed Vitals:   02/24/12 1525 02/24/12 1730  BP: 124/74 149/76   Pulse: 74 86  Temp: 98 F (36.7 C)   TempSrc: Oral   Resp: 20 20  SpO2: 98% 99%     Physical Exam: Blood pressure 149/76, pulse 86, temperature 98 F (36.7 C), temperature source Oral, resp. rate 20, SpO2 99.00%. Gen: No acute distress. Answers questions appropriately. Head: Normocephalic, atraumatic. Eyes: PERRL, EOMI, sclerae nonicteric. Arcus senilis noted. Mouth: Oropharynx clear. No exudates. Neck: Supple, no thyromegaly, no lymphadenopathy, no jugular venous distention. Chest: Lungs diminished in the bases. CV: Heart sounds regular with no murmurs, rubs, or gallops. Abdomen: Soft, nontender, nondistended with normal active bowel sounds. Extremities: Extremities are without clubbing, edema, or cyanosis. Skin: Warm and dry. Neuro: Alert and oriented times 2; cranial nerves II through XII grossly intact. Psych: Mood and affect normal.  Labs on Admission:  Basic Metabolic Panel:  Recent Labs Lab 02/24/12 1546  NA 135  K 3.6  CL 97  CO2 26  GLUCOSE 90  BUN 19  CREATININE 1.27  CALCIUM 9.6   Liver Function Tests:  Recent Labs Lab 02/24/12 1546  AST 16  ALT 7  ALKPHOS 47  BILITOT 0.3  PROT 7.4  ALBUMIN 3.5   CBC:  Recent Labs Lab 02/24/12 1546  WBC 4.2  NEUTROABS 2.9  HGB 11.6*  HCT 34.6*  MCV 85.9  PLT 152   Cardiac Enzymes:  Recent Labs Lab 02/24/12 1545  TROPONINI <0.30    Radiological Exams on Admission: Dg Chest 2 View  02/24/2012  *RADIOLOGY REPORT*  Clinical Data: Dementia.  Syncope.  CHEST - 2 VIEW  Comparison: 09/02/2010  Findings: The lungs are hyperinflated consistent with emphysema. Heart size is normal.  There is calcification of the aorta.  Lungs are clear.  Old rib fracture noted on the left.  Vascularity is normal.  No effusions.  No acute bony finding.  IMPRESSION: Chronic pulmonary hyperinflation.  No active process evident.   Original Report Authenticated By: Paulina Fusi, M.D.    Ct Head Wo Contrast  02/24/2012   *RADIOLOGY REPORT*  Clinical Data: Non-responsive patient, status post CPR.  CT HEAD WITHOUT CONTRAST  Technique:  Contiguous axial images were obtained from the base of the skull through the vertex without contrast.  Comparison: Multiple exams, including 01/10/2006  Findings: Progressive periventricular white matter hypodensity and central atrophy noted. No intracranial hemorrhage, mass lesion, or acute infarction is identified.  Mild chronic sphenoid sinusitis is observed. Chronic-appearing but interval lacunar infarct noted in the head of the left caudate nucleus, image 10 of series 2.  IMPRESSION:  1.  Progressive chronic ischemic microvascular white matter disease and central atrophy. 2.  Interval but chronic lacunar infarct in the head of the left caudate nucleus. 3.  No acute intracranial findings. 4.  Mild chronic sphenoid sinusitis.   Original Report Authenticated By: Gaylyn Rong, M.D.     EKG: Sinus tachycardia at 105 beats per minute.  No ST-T wave abnormalities.  Assessment/Plan Principal Problem:   Syncope  Admit the patient to the step down unit and monitor on telemetry.  Given workup with carotid Dopplers in 2012, would not repeat. CT scan of the head without acute abnormalities.  Suspect this was a vasovagal event, possibly triggered by nausea and vomiting. We'll provide antinausea medicines as needed.  We'll cycle cardiac markers q. 6 hours x3 sets. Check TSH.  12-lead EKG shows only sinus tachycardia.  Hydrate and check orthostatics. Active Problems:   Hypertension  Not on any antihypertensives.  Monitor blood pressure.   Dementia  Continue Namenda.   BPH (benign prostatic hyperplasia)  Continue Flomax.   Left ventricular diastolic dysfunction, NYHA class 1  Chest x-ray clear. No shortness of breath. Appears well compensated.   Cerebrovascular disease  No focal neurologic deficits. Continue aspirin and risk factor modification.   Code Status:  Full. Family Communication: None at bedside. Disposition Plan: Home when stable.  Time spent: 1 hour.  Arisha Gervais [facility] Pager [phone]  If 7PM-7AM, please contact night-coverage [email or web address] Password [facility] 02/24/2012, 7:40 PM

## 2012-02-24 NOTE — ED Provider Notes (Signed)
History     CSN: [id number]  Arrival date & time 02/24/12  1506   First MD Initiated Contact with Patient 02/24/12 1524      Chief Complaint  Patient presents with  . Loss of Consciousness   Level V caveat due to dementia. (Consider location/radiation/quality/duration/timing/severity/associated sxs/prior treatment) Patient is a 79 y.o. male presenting with syncope. The history is provided by the patient.  Loss of Consciousness  This is a new problem.   patient was sitting in bed watching TV with his wife he became unresponsive. He slumped out of bed. His son performed CPR when he was unresponsive and could not find a pulse. EMS arrived and they were able to get a pulse on him. Patient states he feels better now. He's had some nausea now. He is reportedly been doing well while she days. No chest pain or headache. He has baseline dementia.  Past Medical History  Diagnosis Date  . Mental disorder     alzhiemers  . Hypertension   . Dementia   . BPH (benign prostatic hyperplasia)   . Syncope   . Left ventricular diastolic dysfunction, NYHA class 1   . Cerebrovascular disease     Chronic ischemic microvascular white matter disease and central atrophy on CT scan    Past Surgical History  Procedure Laterality Date  . Breast surgery      left tumor  . Pars plana vitrectomy  12/19/2010    Procedure: PARS PLANA VITRECTOMY WITH INTRAOCULAR LENS;  Surgeon: Shade Flood, MD;  Location: [facility] OR;  Service: Ophthalmology;  Laterality: Right;  PARS PLANA VITRECTOMY WITH INTRAOCULAR LENS [900]membrane peel,endolaser right eye    No family history on file.  History  Substance Use Topics  . Smoking status: Never Smoker   . Smokeless tobacco: Not on file  . Alcohol Use: 1.2 oz/week    2 Shots of liquor per week      Review of Systems  Unable to perform ROS: Dementia  Cardiovascular: Positive for syncope.  Neurological: Positive for syncope.    Allergies  Review of patient's  allergies indicates no known allergies.  Home Medications   Current Outpatient Rx  Name  Route  Sig  Dispense  Refill  . memantine (NAMENDA) 10 MG tablet   Oral   Take 10 mg by mouth daily.           Marland Kitchen aspirin EC 81 MG tablet   Oral   Take 81 mg by mouth daily. TAKES IRREGULARLY          . Tamsulosin HCl (FLOMAX) 0.4 MG CAPS   Oral   Take 0.4 mg by mouth daily after supper. TAKES IRREGULARLY            BP 149/76  Pulse 86  Temp(Src) 98 F (36.7 C) (Oral)  Resp 20  SpO2 99%  Physical Exam  Nursing note and vitals reviewed. Constitutional: He appears well-developed and well-nourished.  HENT:  Head: Normocephalic and atraumatic.  Eyes: EOM are normal. Pupils are equal, round, and reactive to light.  Neck: Normal range of motion. Neck supple.  Cardiovascular: Normal rate, regular rhythm and normal heart sounds.   No murmur heard. Pulmonary/Chest: Effort normal and breath sounds normal.  Abdominal: Soft. Bowel sounds are normal. He exhibits no distension and no mass. There is no tenderness. There is no rebound and no guarding.  Musculoskeletal: Normal range of motion. He exhibits no edema.  Neurological: He is alert. No cranial nerve deficit.  Mild  confusion. At baseline per family  Skin: Skin is warm and dry.  Psychiatric: He has a normal mood and affect.    ED Course  Procedures (including critical care time)  Labs Reviewed  CBC WITH DIFFERENTIAL - Abnormal; Notable for the following:    RBC 4.03 (*)    Hemoglobin 11.6 (*)    HCT 34.6 (*)    All other components within normal limits  COMPREHENSIVE METABOLIC PANEL - Abnormal; Notable for the following:    GFR calc non Af Amer 49 (*)    GFR calc Af Amer 57 (*)    All other components within normal limits  TROPONIN I  URINALYSIS, ROUTINE W REFLEX MICROSCOPIC   Dg Chest 2 View  02/24/2012  *RADIOLOGY REPORT*  Clinical Data: Dementia.  Syncope.  CHEST - 2 VIEW  Comparison: 09/02/2010  Findings: The lungs  are hyperinflated consistent with emphysema. Heart size is normal.  There is calcification of the aorta.  Lungs are clear.  Old rib fracture noted on the left.  Vascularity is normal.  No effusions.  No acute bony finding.  IMPRESSION: Chronic pulmonary hyperinflation.  No active process evident.   Original Report Authenticated By: Paulina Fusi, M.D.    Ct Head Wo Contrast  02/24/2012  *RADIOLOGY REPORT*  Clinical Data: Non-responsive patient, status post CPR.  CT HEAD WITHOUT CONTRAST  Technique:  Contiguous axial images were obtained from the base of the skull through the vertex without contrast.  Comparison: Multiple exams, including 01/10/2006  Findings: Progressive periventricular white matter hypodensity and central atrophy noted. No intracranial hemorrhage, mass lesion, or acute infarction is identified.  Mild chronic sphenoid sinusitis is observed. Chronic-appearing but interval lacunar infarct noted in the head of the left caudate nucleus, image 10 of series 2.  IMPRESSION:  1.  Progressive chronic ischemic microvascular white matter disease and central atrophy. 2.  Interval but chronic lacunar infarct in the head of the left caudate nucleus. 3.  No acute intracranial findings. 4.  Mild chronic sphenoid sinusitis.   Original Report Authenticated By: Gaylyn Rong, M.D.      1. Syncope   2. Hypertension   3. Dementia   4. BPH (benign prostatic hyperplasia)   5. Left ventricular diastolic dysfunction, NYHA class 1   6. Cerebrovascular disease     Date: 02/24/2012  Rate: 75  Rhythm: normal sinus rhythm  QRS Axis: right  Intervals: normal  ST/T Wave abnormalities: nonspecific ST/T changes  Conduction Disutrbances:none  Narrative Interpretation:   Old EKG Reviewed: unchanged     MDM  Patient presents after an episode of syncope at home. CPR was done by family member. EMS had vitals upon arrival. Lab work is reassuring at this time. EKG is reassuring. Due to possibility of cardiac  arrest at home patient be admitted to medicine for further evaluation and treatment.        Juliet Rude. Rubin Payor, MD 02/24/12 2011

## 2012-02-24 NOTE — Progress Notes (Signed)
Discussed with Dr. Rubin Payor, EDP who consulted [facility] to admit this patient. 79 year old male with history of HTN and dementia, suddenly became unresponsive while sitting on bed watching TV. Son could not find a pulse and performed CPR before EMS arrived and were able to feel a pulse. Initial systolic blood pressure on site 60 mmHg. In the ED, per report, vital signs stable, patient alert and without distress, EKG said to be without acute findings and CT head pending. Accepted admission to step down, inpatient status to [facility] 10. Syncope, Hypotension, ? Cardiac Arrest. Reported as Full Code. Informed Flow Manager that patient has to be seen and H&P to be done.  Briahnna Harries 6:44 PM 02/24/2012

## 2012-02-25 ENCOUNTER — Encounter (HOSPITAL_COMMUNITY): Payer: Self-pay | Admitting: Family Medicine

## 2012-02-25 DIAGNOSIS — F0390 Unspecified dementia without behavioral disturbance: Secondary | ICD-10-CM

## 2012-02-25 DIAGNOSIS — E860 Dehydration: Secondary | ICD-10-CM

## 2012-02-25 DIAGNOSIS — N400 Enlarged prostate without lower urinary tract symptoms: Secondary | ICD-10-CM

## 2012-02-25 LAB — BASIC METABOLIC PANEL
CO2: 29 mEq/L (ref 19–32)
Calcium: 8.8 mg/dL (ref 8.4–10.5)
GFR calc non Af Amer: 55 mL/min — ABNORMAL LOW (ref >90)
Potassium: 3.9 mEq/L (ref 3.5–5.1)
Sodium: 139 mEq/L (ref 135–145)

## 2012-02-25 LAB — CBC
MCH: 28.5 pg (ref 26.0–34.0)
Platelets: 158 10*3/uL (ref 150–400)
RBC: 3.82 MIL/uL — ABNORMAL LOW (ref 4.22–5.81)

## 2012-02-25 LAB — TSH: TSH: 1.025 u[IU]/mL (ref 0.350–4.500)

## 2012-02-25 LAB — TROPONIN I: Troponin I: <0.3 ng/mL (ref <0.30)

## 2012-02-25 LAB — GLUCOSE, CAPILLARY: Glucose-Capillary: 95 mg/dL (ref 70–99)

## 2012-02-25 NOTE — Progress Notes (Addendum)
[facility] Progress Note [place] TEAM 1 - Stepdown/ICU TEAM   Cesar Maldonado ZOX:[id number] DOB: 10-26-1924 DOA: 02/24/2012 PCP: Laurena Slimmer, MD  Brief narrative: 79 year old male patient with history of Alzheimer's dementia and hypertension. Witnessed unresponsive episode while sitting on bed watching TV. Patient's son was unable to find a pulse so CPR was initiated. EMS was called and upon their arrival the patient did have a pulse with a systolic blood pressure in the 60s. Patient regained consciousness. He was otherwise asymptomatic upon presentation to the ER. Apparently had several episodes of nausea and vomiting recently. Past medical history significant for syncopal episodes in 2012  attributed  to nausea and vomiting. Thorough workup at that time showed no definitive cardiovascular etiology to his syncope.  Assessment/Plan: Principal Problem:   Syncope and collapse -history of nausea and vomiting induced syncope in 2012 which she had a full workup. -CT head unrevealing -Apparently a new antihypertensive medication has been initiated but will need to clarify this with the family prior to discharge -No abnormal arrhythmia seen on telemetry since admission  Active Problems:   Dehydration -Not orthostatic but we'll continue IV fluids for now since blood pressure soft  Leukocytosis No obvious signs of infection, UA and chest x-ray negative-will recheck tomorrow    Hypertension -Home meds list only Flomax as antihypertensive medication; BP is soft but we'll continue for now - monitor for additional hypotension    Left ventricular diastolic dysfunction, NYHA class 1 -Compensated without evidence of active heart failure    Cerebrovascular disease -Current neurological exam nonfocal without any extremity weakness or numbness    Dementia -Unable to adequately contribute to history -no family in room at time of our evaluation -Continue home medication    BPH (benign  prostatic hyperplasia) -Continue Flomax   DVT prophylaxis: Lovenox Code Status: Full Family Communication: Patient Disposition Plan: Transfer to telemetry  Consultants: None  Procedures: None  Antibiotics: None  HPI/Subjective: Patient awake and alert although confused and having difficulty remembering recent history. Denies dizziness when sitting up. Was able to reposition self up to sit on side of bed to eat breakfast. Noted to be incontinent of urine.   Objective: Blood pressure 117/62, pulse 80, temperature 98.7 F (37.1 C), temperature source Oral, resp. rate 19, height 5\' 5"  (1.651 m), weight 37.6 kg (82 lb 14.3 oz), SpO2 98.00%.  Intake/Output Summary (Last 24 hours) at 02/25/12 1043 Last data filed at 02/25/12 0900  Gross per 24 hour  Intake 858.75 ml  Output    150 ml  Net 708.75 ml     Exam: General: No acute respiratory distress Lungs: Clear to auscultation bilaterally without wheezes or crackles, room air Cardiovascular: Regular rate and sinus rhythm without murmur gallop or rub normal S1 and S2, no JVD or peripheral edema Abdomen: Nontender, nondistended, soft, bowel sounds positive, no rebound, no ascites, no appreciable mass Musculoskeletal: No significant cyanosis, clubbing of bilateral lower extremities Neurological: Alert and oriented to name and place, moves all extremities x4 without any appreciable focal weaknesses, cranial nerves II through XII grossly intact  Data Reviewed: Basic Metabolic Panel:  Recent Labs Lab 02/24/12 1546  NA 135  K 3.6  CL 97  CO2 26  GLUCOSE 90  BUN 19  CREATININE 1.27  CALCIUM 9.6   Liver Function Tests:  Recent Labs Lab 02/24/12 1546  AST 16  ALT 7  ALKPHOS 47  BILITOT 0.3  PROT 7.4  ALBUMIN 3.5   No results found for this  basename: LIPASE, AMYLASE,  in the last 168 hours No results found for this basename: AMMONIA,  in the last 168 hours CBC:  Recent Labs Lab 02/24/12 1546 02/25/12 0826   WBC 4.2 15.6*  NEUTROABS 2.9  --   HGB 11.6* 10.9*  HCT 34.6* 32.8*  MCV 85.9 85.9  PLT 152 158   Cardiac Enzymes:  Recent Labs Lab 02/24/12 1545 02/24/12 2144 02/25/12 0311  TROPONINI <0.30 <0.30 <0.30   BNP (last 3 results) No results found for this basename: PROBNP,  in the last 8760 hours CBG:  Recent Labs Lab 02/25/12 0845  GLUCAP 95    Recent Results (from the past 240 hour(s))  MRSA PCR SCREENING     Status: None   Collection Time    02/24/12  9:34 PM      Result Value Range Status   MRSA by PCR NEGATIVE  NEGATIVE Final   Comment:            The GeneXpert MRSA Assay (FDA     approved for NASAL specimens     only), is one component of a     comprehensive MRSA colonization     surveillance program. It is not     intended to diagnose MRSA     infection nor to guide or     monitor treatment for     MRSA infections.     Studies:  Recent x-ray studies have been reviewed in detail by the Attending Physician  Scheduled Meds:  Reviewed in detail by the Attending Physician   Junious Silk, ANP [facility] Office  [phone] Pager [phone]  On-Call/Text Page:      [email or web address]      password [email or web address]  If 7PM-7AM, please contact night-coverage [email or web address] Password [facility] 02/25/2012, 10:43 AM   LOS: 1 day   I have examined the patient, reviewed the chart and modified the above note which I agree with.   Estle Sabella,MD [phone] 02/25/2012, 3:04 PM

## 2012-02-25 NOTE — Progress Notes (Signed)
Report called to Chrissie, 3W RN. Pt to transfer to room 3W-17 via wheelchair, IV no O2. Family and belongings at bedside. VS stable at time of transfer. Meds in chart. No current questions or complaints. Djibril Glogowski L

## 2012-02-25 NOTE — Progress Notes (Signed)
Attempted to call report x 2. 3W RN to call back when ready. Cru Kritikos L

## 2012-02-25 NOTE — Evaluation (Signed)
Physical Therapy Evaluation Patient Details Name: Cesar Maldonado MRN: [id number] DOB: 08-06-1924 Today's Date: 02/25/2012 Time: 4098-1191 PT Time Calculation (min): 17 min  PT Assessment / Plan / Recommendation Clinical Impression  Pt admitted with syncope. Pt reports no other episodes of syncope, no dizziness with mobility and able to perform all mobility at baseline level without assist. Recommend daily moblity with nursing acutely to maintain function no further therapy needs at this time.     PT Assessment  Patent does not need any further PT services    Follow Up Recommendations  No PT follow up    Does the patient have the potential to tolerate intense rehabilitation      Barriers to Discharge        Equipment Recommendations  None recommended by PT    Recommendations for Other Services     Frequency      Precautions / Restrictions Precautions Precautions: None Restrictions Weight Bearing Restrictions: No   Pertinent Vitals/Pain 117/54 (69) supine 120/63 (74)sitting 115/64 (75) standing sats 95-100% on RA HR 94  No pain     Mobility  Bed Mobility Bed Mobility: Supine to Sit Supine to Sit: 6: Modified independent (Device/Increase time);HOB flat Transfers Transfers: Sit to Stand;Stand to Sit Sit to Stand: 6: Modified independent (Device/Increase time);From bed Stand to Sit: 6: Modified independent (Device/Increase time);To chair/3-in-1 Ambulation/Gait Ambulation/Gait Assistance: 7: Independent Ambulation Distance (Feet): 700 Feet Assistive device: None Ambulation/Gait Assistance Details: Pt with good gait speed able to complete long hall ambulation with head turns and no LOB Gait Pattern: Within Functional Limits Gait velocity: WFL Stairs: Yes Stairs Assistance: 6: Modified independent (Device/Increase time) Stair Management Technique: One rail Right Number of Stairs: 3    Exercises     PT Diagnosis:    PT Problem List:   PT Treatment  Interventions:     PT Goals    Visit Information  Last PT Received On: 02/25/12 Assistance Needed: +1    Subjective Data  Subjective: I don't do much since I retired Patient Stated Goal: go home   Prior Functioning  Home Living Lives With: Spouse Available Help at Discharge: Family;Available 24 hours/day Type of Home: House Home Access: Stairs to enter [employer] of Steps: 3 Entrance Stairs-Rails: Right Home Layout: One level Bathroom Shower/Tub: Engineer, manufacturing systems: Standard Home Adaptive Equipment: None Prior Function Level of Independence: Independent Able to Take Stairs?: Yes Driving: No Vocation: Retired Comments: pt reports he and spouse share housework and cooking duties, pt doesn't drive due to limited vision Communication Communication: No difficulties    Copywriter, advertising Overall Cognitive Status: History of cognitive impairments - at baseline Arousal/Alertness: Awake/alert Orientation Level: Appears intact for tasks assessed Behavior During Session: [facility] for tasks performed    Extremity/Trunk Assessment Right Upper Extremity Assessment RUE ROM/Strength/Tone: [facility] for tasks assessed Left Upper Extremity Assessment LUE ROM/Strength/Tone: [facility] for tasks assessed Right Lower Extremity Assessment RLE ROM/Strength/Tone: [facility] for tasks assessed Left Lower Extremity Assessment LLE ROM/Strength/Tone: [facility] for tasks assessed Trunk Assessment Trunk Assessment: Normal   Balance    End of Session PT - End of Session Equipment Utilized During Treatment: Gait belt Activity Tolerance: Patient tolerated treatment well Patient left: in chair Nurse Communication: Mobility status  GP     Delorse Lek 02/25/2012, 10:59 AM Delaney Meigs, PT [phone]

## 2012-02-25 NOTE — Progress Notes (Signed)
Attempted to call report x 1. Apple Dearmas L  

## 2012-02-26 DIAGNOSIS — I679 Cerebrovascular disease, unspecified: Secondary | ICD-10-CM

## 2012-02-26 LAB — BASIC METABOLIC PANEL
GFR calc Af Amer: 72 mL/min — ABNORMAL LOW (ref >90)
GFR calc non Af Amer: 62 mL/min — ABNORMAL LOW (ref >90)
Potassium: 3.8 mEq/L (ref 3.5–5.1)
Sodium: 138 mEq/L (ref 135–145)

## 2012-02-26 LAB — CBC
Hemoglobin: 10.1 g/dL — ABNORMAL LOW (ref 13.0–17.0)
MCHC: 33.3 g/dL (ref 30.0–36.0)
RDW: 15.3 % (ref 11.5–15.5)
WBC: 12.4 10*3/uL — ABNORMAL HIGH (ref 4.0–10.5)

## 2012-02-26 NOTE — Discharge Summary (Signed)
Physician Discharge Summary  MAVEN VARELAS MVH:[id number] DOB: [birth year] DOA: 02/24/2012  PCP: Laurena Slimmer, MD  Admit date: 02/24/2012 Discharge date: 02/26/2012  Time spent: 45 minutes  Recommendations for Outpatient Follow-up:  1. The patient will followup at [facility] cardiology for syncope workup  Discharge Diagnoses:  Syncope and collapse   Hypertension   Dementia   BPH (benign prostatic hyperplasia)   Left ventricular diastolic dysfunction, NYHA class 1   Cerebrovascular disease   Dehydration   Discharge Condition: good  Diet recommendation: regular   Filed Weights   02/24/12 2115 02/26/12 0533  Weight: 37.6 kg (82 lb 14.3 oz) 37.603 kg (82 lb 14.4 oz)    History of present illness:  Cesar Maldonado is an 79 y.o. male with a PMH of Alzheimer's dementia and HTN, who suddenly became unresponsive while sitting on bed watching TV. A review of the patient's chart indicates that he has had syncope after vomiting spells in the in 2012. He ultimately underwent carotid duplex and echocardiogram in September 2012. Echo 09/19/10 showed EF 55%, normal LV wall motion, grade 1 diastolic dysfunction, trivial AR. Carotid duplex showed no significant ICA stenosis. According to Dr. Richardean Chimera notes, the patient's son could not find a pulse and started CPR before EMS arrived. EMS personnel were able to feel a pulse and reported BP was 60 mmHg ststolic. Regained consciousness when EMS arrived. The patient is relatively asymptomatic. He only complains of feeling a little bit cold. He had a few episodes of nausea and vomiting as well. No family is currently present for me to get additional history. Upon initial evaluation emergency department, the patient's vital signs are stable, he is afebrile.   Hospital Course:  Syncope and collapse  - History of the episode is consistent with a vasovagal phenomenon - patient found bradycardic and hypotensive by EMS upon arrival at his house.  -history  of nausea and vomiting induced syncope in 2012 for which he had a full workup.  -CT head unrevealing  - Patient was advised to stop benicar/hctz -No abnormal arrhythmia seen on telemetry  - Patient will followup at [facility] cardiology for consideration  of Holter monitoring Dehydration  -Patient received IV fluids and dehydration resolved Leukocytosis  No obvious signs of infection, UA and chest x-ray negative. Improved prior to discharge, probably from demargination Left ventricular diastolic dysfunction, NYHA class 1  -Compensated without evidence of active heart failure  Cerebrovascular disease  -Current neurological exam nonfocal without any extremity weakness or numbness  Dementia  - Seems quite stable and comfortable BPH (benign prostatic hyperplasia)  -Continue Flomax . If the severity of BPH is not great, the primary care physician could consider discontinue this medication     Procedures: none Consultations:  none  Discharge Exam: Filed Vitals:   02/25/12 1053 02/25/12 1244 02/25/12 2030 02/26/12 0533  BP: 115/64 125/68 102/61 111/65  Pulse: 94 88 91 81  Temp:  99.2 F (37.3 C) 99.1 F (37.3 C) 98.8 F (37.1 C)  TempSrc:    Oral  Resp:  18 16 17   Height:      Weight:    37.603 kg (82 lb 14.4 oz)  SpO2: 100% 100% 95% 98%    General: axox3 Cardiovascular: rrr Respiratory: ctab   Discharge Instructions  Discharge Orders   Future Orders Complete By Expires     Diet - low sodium heart healthy  As directed     Increase activity slowly  As directed  Medication List    STOP taking these medications       olmesartan-hydrochlorothiazide 20-12.5 MG per tablet  Commonly known as:  BENICAR HCT      TAKE these medications       latanoprost 0.005 % ophthalmic solution  Commonly known as:  XALATAN  Place 1 drop into both eyes at bedtime.     memantine 10 MG tablet  Commonly known as:  NAMENDA  Take 10 mg by mouth daily.     SIMBRINZA 1-0.2  % Susp  Generic drug:  Brinzolamide-Brimonidine  Place 1 drop into the right eye daily.     VITAMIN D PO  Take 1 tablet by mouth daily.           Follow-up Information   Follow up with [facility] cardiology .       The results of significant diagnostics from this hospitalization (including imaging, microbiology, ancillary and laboratory) are listed below for reference.    Significant Diagnostic Studies: Dg Chest 2 View  02/24/2012  *RADIOLOGY REPORT*  Clinical Data: Dementia.  Syncope.  CHEST - 2 VIEW  Comparison: 09/02/2010  Findings: The lungs are hyperinflated consistent with emphysema. Heart size is normal.  There is calcification of the aorta.  Lungs are clear.  Old rib fracture noted on the left.  Vascularity is normal.  No effusions.  No acute bony finding.  IMPRESSION: Chronic pulmonary hyperinflation.  No active process evident.   Original Report Authenticated By: Paulina Fusi, M.D.    Ct Head Wo Contrast  02/24/2012  *RADIOLOGY REPORT*  Clinical Data: Non-responsive patient, status post CPR.  CT HEAD WITHOUT CONTRAST  Technique:  Contiguous axial images were obtained from the base of the skull through the vertex without contrast.  Comparison: Multiple exams, including 01/10/2006  Findings: Progressive periventricular white matter hypodensity and central atrophy noted. No intracranial hemorrhage, mass lesion, or acute infarction is identified.  Mild chronic sphenoid sinusitis is observed. Chronic-appearing but interval lacunar infarct noted in the head of the left caudate nucleus, image 10 of series 2.  IMPRESSION:  1.  Progressive chronic ischemic microvascular white matter disease and central atrophy. 2.  Interval but chronic lacunar infarct in the head of the left caudate nucleus. 3.  No acute intracranial findings. 4.  Mild chronic sphenoid sinusitis.   Original Report Authenticated By: Gaylyn Rong, M.D.     Microbiology: Recent Results (from the past 240 hour(s))   MRSA PCR SCREENING     Status: None   Collection Time    02/24/12  9:34 PM      Result Value Range Status   MRSA by PCR NEGATIVE  NEGATIVE Final   Comment:            The GeneXpert MRSA Assay (FDA     approved for NASAL specimens     only), is one component of a     comprehensive MRSA colonization     surveillance program. It is not     intended to diagnose MRSA     infection nor to guide or     monitor treatment for     MRSA infections.     Labs: Basic Metabolic Panel:  Recent Labs Lab 02/24/12 1546 02/25/12 0826 02/26/12 0500  NA 135 139 138  K 3.6 3.9 3.8  CL 97 100 104  CO2 26 29 29   GLUCOSE 90 80 93  BUN 19 17 17   CREATININE 1.27 1.16 1.05  CALCIUM 9.6 8.8 8.7   Liver  Function Tests:  Recent Labs Lab 02/24/12 1546  AST 16  ALT 7  ALKPHOS 47  BILITOT 0.3  PROT 7.4  ALBUMIN 3.5   No results found for this basename: LIPASE, AMYLASE,  in the last 168 hours No results found for this basename: AMMONIA,  in the last 168 hours CBC:  Recent Labs Lab 02/24/12 1546 02/25/12 0826 02/26/12 0500  WBC 4.2 15.6* 12.4*  NEUTROABS 2.9  --   --   HGB 11.6* 10.9* 10.1*  HCT 34.6* 32.8* 30.3*  MCV 85.9 85.9 86.3  PLT 152 158 140*   Cardiac Enzymes:  Recent Labs Lab 02/24/12 1545 02/24/12 2144 02/25/12 0311 02/25/12 0826  TROPONINI <0.30 <0.30 <0.30 <0.30   BNP: BNP (last 3 results) No results found for this basename: PROBNP,  in the last 8760 hours CBG:  Recent Labs Lab 02/25/12 0845  GLUCAP 95       Signed:  Arhaan Chesnut  [facility] 02/26/2012, 9:37 AM

## 2012-03-19 ENCOUNTER — Other Ambulatory Visit (HOSPITAL_COMMUNITY): Payer: Self-pay | Admitting: Cardiology

## 2012-03-19 DIAGNOSIS — R55 Syncope and collapse: Secondary | ICD-10-CM

## 2012-03-19 DIAGNOSIS — R001 Bradycardia, unspecified: Secondary | ICD-10-CM

## 2012-04-03 ENCOUNTER — Ambulatory Visit (HOSPITAL_COMMUNITY)
Admission: RE | Admit: 2012-04-03 | Discharge: 2012-04-03 | Disposition: A | Discharge status: Home / Self Care | Payer: Medicare Other | Source: Ambulatory Visit | Attending: Cardiology | Admitting: Cardiology

## 2012-04-03 DIAGNOSIS — R55 Syncope and collapse: Secondary | ICD-10-CM | POA: Insufficient documentation

## 2012-04-03 DIAGNOSIS — I498 Other specified cardiac arrhythmias: Secondary | ICD-10-CM | POA: Insufficient documentation

## 2012-04-03 DIAGNOSIS — R001 Bradycardia, unspecified: Secondary | ICD-10-CM

## 2012-04-03 NOTE — Progress Notes (Signed)
[facility]   2D echo completed 04/03/2012.   Cindy Mersadie Kavanaugh, RDCS  

## 2012-10-27 ENCOUNTER — Ambulatory Visit (HOSPITAL_COMMUNITY)
Admission: RE | Admit: 2012-10-27 | Discharge: 2012-10-27 | Disposition: A | Discharge status: Home / Self Care | Payer: Medicare Other | Source: Ambulatory Visit | Attending: Internal Medicine | Admitting: Internal Medicine

## 2012-10-27 ENCOUNTER — Other Ambulatory Visit: Payer: Self-pay | Admitting: Internal Medicine

## 2012-10-27 DIAGNOSIS — M542 Cervicalgia: Secondary | ICD-10-CM | POA: Insufficient documentation

## 2012-10-27 DIAGNOSIS — M899 Disorder of bone, unspecified: Secondary | ICD-10-CM | POA: Insufficient documentation

## 2012-10-27 DIAGNOSIS — M4800 Spinal stenosis, site unspecified: Secondary | ICD-10-CM | POA: Insufficient documentation

## 2012-10-27 DIAGNOSIS — M5030 Other cervical disc degeneration, unspecified cervical region: Secondary | ICD-10-CM | POA: Insufficient documentation

## 2013-02-02 ENCOUNTER — Ambulatory Visit (INDEPENDENT_AMBULATORY_CARE_PROVIDER_SITE_OTHER): Payer: Medicare Other

## 2013-02-02 VITALS — BP 143/71 | HR 70 | Resp 16

## 2013-02-02 DIAGNOSIS — B351 Tinea unguium: Secondary | ICD-10-CM

## 2013-02-02 DIAGNOSIS — M79609 Pain in unspecified limb: Secondary | ICD-10-CM

## 2013-02-02 NOTE — Patient Instructions (Signed)
Onychomycosis/Fungal Toenails  WHAT IS IT? An infection that lies within the keratin of your nail plate that is caused by a fungus.  WHY ME? Fungal infections affect all ages, sexes, races, and creeds.  There may be many factors that predispose you to a fungal infection such as age, coexisting medical conditions such as diabetes, or an autoimmune disease; stress, medications, fatigue, genetics, etc.  Bottom line: fungus thrives in a warm, moist environment and your shoes offer such a location.  IS IT CONTAGIOUS? Theoretically, yes.  You do not want to share shoes, nail clippers or files with someone who has fungal toenails.  Walking around barefoot in the same room or sleeping in the same bed is unlikely to transfer the organism.  It is important to realize, however, that fungus can spread easily from one nail to the next on the same foot.  HOW DO WE TREAT THIS?  There are several ways to treat this condition.  Treatment may depend on many factors such as age, medications, pregnancy, liver and kidney conditions, etc.  It is best to ask your doctor which options are available to you.  1. No treatment.   Unlike many other medical concerns, you can live with this condition.  However for many people this can be a painful condition and may lead to ingrown toenails or a bacterial infection.  It is recommended that you keep the nails cut short to help reduce the amount of fungal nail. 2. Topical treatment.  These range from herbal remedies to prescription strength nail lacquers.  About 40-50% effective, topicals require twice daily application for approximately 9 to 12 months or until an entirely new nail has grown out.  The most effective topicals are medical grade medications available through physicians offices. 3. Oral antifungal medications.  With an 80-90% cure rate, the most common oral medication requires 3 to 4 months of therapy and stays in your system for a year as the new nail grows out.  Oral  antifungal medications do require blood work to make sure it is a safe drug for you.  A liver function panel will be performed prior to starting the medication and after the first month of treatment.  It is important to have the blood work performed to avoid any harmful side effects.  In general, this medication safe but blood work is required. 4. Laser Therapy.  This treatment is performed by applying a specialized laser to the affected nail plate.  This therapy is noninvasive, fast, and non-painful.  It is not covered by insurance and is therefore, out of pocket.  The results have been very good with a 80-95% cure rate.  [facility] is the only practice in the area to offer this therapy. 5. Permanent Nail Avulsion.  Removing the entire nail so that a new nail will not grow back.   Followup for regular nail care every 3-4 months

## 2013-02-02 NOTE — Progress Notes (Signed)
   Subjective:    Patient ID: [name], male    DOB: [birth year], 80 y.o.   MRN: [id number]  HPI Comments: "Cut his toenails"  patient last seen approximately 18 months ago at that time for palliative nail care and thick mycotic nails. Nails have not been cut since that time nails extremely hypertrophic and overgrown greater than inch and a half in length beyond the normal length painful and tender 1 through 5 bilateral    Review of Systems  Constitutional: Negative.   HENT: Negative.   Respiratory: Negative.   Cardiovascular: Negative.   Gastrointestinal: Negative.   Endocrine: Negative.   Genitourinary: Negative.   Musculoskeletal: Negative.   Skin: Negative.   Allergic/Immunologic: Negative.   Neurological: Negative.   Hematological: Negative.   Psychiatric/Behavioral: Negative.   All other systems reviewed and are negative.       Objective:   Physical Exam Vascular status is intact as follows patient's pedal pulses DP +2/4 bilateral PT plus one over 4 bilateral Refill timed 3-4 seconds all digits neurologically epicritic and proprioceptive sensations appear to be intact there may be some paresthesia: The patient does have some hypersensitivity in particular the toenails and nailbeds due to severe hypertrophy and discoloration thickening of the nails. Neurologically epicritic and proprioceptive sensations appear to be intact there is normal plantar response DTRs not elicited. Dermatologically skin color pigment normal hair growth absent nails extremely thick dark and friable discolored and brittle and overgrown 1 through 5 bilateral. No secondary infection no ascending cellulitis lymphangitis no ulcers noted.       Assessment & Plan:  Assessment this time is onychomycosis painful mycotic nails 1 through 5 bilateral with discoloration dystrophy and brittleness. Nails debrided x10 at this time the presence of pain in symptomology on debridement there is a small bleed of the  distal tuft of the second and third digits bilateral which are treated with lumicain and Neosporin and Band-Aid dressings being applied. Maintain Neosporin and Band-Aid dressing for couple days contact us if there is any failure to improve or resolve. Maintain appropriate accommodative dressings at this time of the hyperkeratotic friable nails debrided x10 followup in 3 months for continued palliative care in the future. Next  Alvan Dameichard Randeep Biondolillo DPM

## 2013-10-20 ENCOUNTER — Encounter (HOSPITAL_COMMUNITY): Payer: Self-pay | Admitting: Emergency Medicine

## 2013-10-20 ENCOUNTER — Emergency Department (HOSPITAL_COMMUNITY): Payer: Medicare Other

## 2013-10-20 ENCOUNTER — Inpatient Hospital Stay (HOSPITAL_COMMUNITY)
Admission: EM | Admit: 2013-10-20 | Discharge: 2013-10-25 | DRG: 193 | Disposition: A | Discharge status: Home Health | Payer: Medicare Other | Attending: Internal Medicine | Admitting: Internal Medicine

## 2013-10-20 DIAGNOSIS — J069 Acute upper respiratory infection, unspecified: Secondary | ICD-10-CM | POA: Diagnosis present

## 2013-10-20 DIAGNOSIS — I519 Heart disease, unspecified: Secondary | ICD-10-CM | POA: Diagnosis present

## 2013-10-20 DIAGNOSIS — E860 Dehydration: Secondary | ICD-10-CM | POA: Diagnosis present

## 2013-10-20 DIAGNOSIS — I5042 Chronic combined systolic (congestive) and diastolic (congestive) heart failure: Secondary | ICD-10-CM

## 2013-10-20 DIAGNOSIS — N183 Chronic kidney disease, stage 3 unspecified: Secondary | ICD-10-CM

## 2013-10-20 DIAGNOSIS — Z8673 Personal history of transient ischemic attack (TIA), and cerebral infarction without residual deficits: Secondary | ICD-10-CM

## 2013-10-20 DIAGNOSIS — F10129 Alcohol abuse with intoxication, unspecified: Secondary | ICD-10-CM | POA: Diagnosis present

## 2013-10-20 DIAGNOSIS — I5032 Chronic diastolic (congestive) heart failure: Secondary | ICD-10-CM

## 2013-10-20 DIAGNOSIS — N400 Enlarged prostate without lower urinary tract symptoms: Secondary | ICD-10-CM

## 2013-10-20 DIAGNOSIS — R0902 Hypoxemia: Secondary | ICD-10-CM | POA: Diagnosis present

## 2013-10-20 DIAGNOSIS — I10 Essential (primary) hypertension: Secondary | ICD-10-CM

## 2013-10-20 DIAGNOSIS — F1027 Alcohol dependence with alcohol-induced persisting dementia: Secondary | ICD-10-CM

## 2013-10-20 DIAGNOSIS — F0280 Dementia in other diseases classified elsewhere without behavioral disturbance: Secondary | ICD-10-CM | POA: Diagnosis present

## 2013-10-20 DIAGNOSIS — J189 Pneumonia, unspecified organism: Principal | ICD-10-CM

## 2013-10-20 DIAGNOSIS — G309 Alzheimer's disease, unspecified: Secondary | ICD-10-CM | POA: Diagnosis present

## 2013-10-20 DIAGNOSIS — I959 Hypotension, unspecified: Secondary | ICD-10-CM | POA: Diagnosis present

## 2013-10-20 DIAGNOSIS — J449 Chronic obstructive pulmonary disease, unspecified: Secondary | ICD-10-CM | POA: Diagnosis present

## 2013-10-20 DIAGNOSIS — F0390 Unspecified dementia without behavioral disturbance: Secondary | ICD-10-CM | POA: Diagnosis present

## 2013-10-20 DIAGNOSIS — R55 Syncope and collapse: Secondary | ICD-10-CM | POA: Diagnosis not present

## 2013-10-20 DIAGNOSIS — J9601 Acute respiratory failure with hypoxia: Secondary | ICD-10-CM

## 2013-10-20 DIAGNOSIS — Z681 Body mass index (BMI) 19 or less, adult: Secondary | ICD-10-CM | POA: Diagnosis not present

## 2013-10-20 DIAGNOSIS — E43 Unspecified severe protein-calorie malnutrition: Secondary | ICD-10-CM

## 2013-10-20 DIAGNOSIS — R001 Bradycardia, unspecified: Secondary | ICD-10-CM | POA: Diagnosis present

## 2013-10-20 DIAGNOSIS — I5189 Other ill-defined heart diseases: Secondary | ICD-10-CM

## 2013-10-20 DIAGNOSIS — Y906 Blood alcohol level of 120-199 mg/100 ml: Secondary | ICD-10-CM | POA: Diagnosis present

## 2013-10-20 DIAGNOSIS — I129 Hypertensive chronic kidney disease with stage 1 through stage 4 chronic kidney disease, or unspecified chronic kidney disease: Secondary | ICD-10-CM | POA: Diagnosis present

## 2013-10-20 DIAGNOSIS — I679 Cerebrovascular disease, unspecified: Secondary | ICD-10-CM

## 2013-10-20 DIAGNOSIS — R780 Finding of alcohol in blood: Secondary | ICD-10-CM

## 2013-10-20 DIAGNOSIS — I639 Cerebral infarction, unspecified: Secondary | ICD-10-CM

## 2013-10-20 DIAGNOSIS — Z7982 Long term (current) use of aspirin: Secondary | ICD-10-CM | POA: Diagnosis not present

## 2013-10-20 DIAGNOSIS — R930 Abnormal findings on diagnostic imaging of skull and head, not elsewhere classified: Secondary | ICD-10-CM

## 2013-10-20 LAB — CBC WITH DIFFERENTIAL/PLATELET
BASOS ABS: 0 10*3/uL (ref 0.0–0.1)
BASOS PCT: 0 % (ref 0–1)
EOS ABS: 0 10*3/uL (ref 0.0–0.7)
EOS PCT: 0 % (ref 0–5)
HCT: 33.1 % — ABNORMAL LOW (ref 39.0–52.0)
Hemoglobin: 11 g/dL — ABNORMAL LOW (ref 13.0–17.0)
Lymphocytes Relative: 21 % (ref 12–46)
Lymphs Abs: 1 10*3/uL (ref 0.7–4.0)
MCH: 28.3 pg (ref 26.0–34.0)
MCHC: 33.2 g/dL (ref 30.0–36.0)
MCV: 85.1 fL (ref 78.0–100.0)
Monocytes Absolute: 0.5 10*3/uL (ref 0.1–1.0)
Monocytes Relative: 11 % (ref 3–12)
Neutro Abs: 3.1 10*3/uL (ref 1.7–7.7)
Neutrophils Relative %: 68 % (ref 43–77)
PLATELETS: 172 10*3/uL (ref 150–400)
RBC: 3.89 MIL/uL — ABNORMAL LOW (ref 4.22–5.81)
RDW: 14 % (ref 11.5–15.5)
WBC: 4.5 10*3/uL (ref 4.0–10.5)

## 2013-10-20 LAB — COMPREHENSIVE METABOLIC PANEL
ALT: 12 U/L (ref 0–53)
AST: 21 U/L (ref 0–37)
Albumin: 3.5 g/dL (ref 3.5–5.2)
Alkaline Phosphatase: 59 U/L (ref 39–117)
Anion gap: 14 (ref 5–15)
BUN: 11 mg/dL (ref 6–23)
CO2: 24 mEq/L (ref 19–32)
Calcium: 9.1 mg/dL (ref 8.4–10.5)
Chloride: 98 mEq/L (ref 96–112)
Creatinine, Ser: 1.13 mg/dL (ref 0.50–1.35)
GFR calc non Af Amer: 56 mL/min — ABNORMAL LOW (ref >90)
GFR, EST AFRICAN AMERICAN: 65 mL/min — AB (ref >90)
GLUCOSE: 98 mg/dL (ref 70–99)
Potassium: 4.2 mEq/L (ref 3.7–5.3)
SODIUM: 136 meq/L — AB (ref 137–147)
TOTAL PROTEIN: 7.9 g/dL (ref 6.0–8.3)
Total Bilirubin: <0.2 mg/dL — ABNORMAL LOW (ref 0.3–1.2)

## 2013-10-20 LAB — TROPONIN I: Troponin I: <0.3 ng/mL

## 2013-10-20 LAB — ETHANOL: Alcohol, Ethyl (B): 135 mg/dL — ABNORMAL HIGH (ref 0–11)

## 2013-10-20 MED ORDER — DEXTROSE 5 % IV SOLN
500.0000 mg | Freq: Once | INTRAVENOUS | Status: AC
  Administered 2013-10-20: 500 mg via INTRAVENOUS
  Filled 2013-10-20: qty 500

## 2013-10-20 MED ORDER — ONDANSETRON HCL 4 MG/2ML IJ SOLN
4.0000 mg | Freq: Four times a day (QID) | INTRAMUSCULAR | Status: DC | PRN
  Administered 2013-10-20: 4 mg via INTRAVENOUS
  Filled 2013-10-20: qty 2

## 2013-10-20 MED ORDER — SODIUM CHLORIDE 0.9 % IV SOLN
INTRAVENOUS | Status: AC
  Administered 2013-10-20: 23:00:00 via INTRAVENOUS

## 2013-10-20 MED ORDER — SODIUM CHLORIDE 0.9 % IV BOLUS (SEPSIS)
500.0000 mL | Freq: Once | INTRAVENOUS | Status: AC
  Administered 2013-10-20: 500 mL via INTRAVENOUS

## 2013-10-20 MED ORDER — SODIUM CHLORIDE 0.9 % IV SOLN
INTRAVENOUS | Status: AC
  Administered 2013-10-20: 22:00:00 via INTRAVENOUS

## 2013-10-20 MED ORDER — DEXTROSE 5 % IV SOLN
1.0000 g | Freq: Once | INTRAVENOUS | Status: AC
  Administered 2013-10-20: 1 g via INTRAVENOUS
  Filled 2013-10-20: qty 10

## 2013-10-20 NOTE — ED Notes (Signed)
The patient has vomited x3 and the tech reported to the RN in charge.

## 2013-10-20 NOTE — ED Notes (Addendum)
Pt reports to the ED for eval of syncopal episode. Pt was found unresponsive by wife. Pt was lethargic upon EMS arrival. After a few minutes he woke up and was refusing transport. However, he had another syncopal episode and his HR dropped into the 30s and he had an episode of vomiting. CBG 170 mg/dl. Pt denies any pain at this time. Pt A&O to person, place, and situation. Disoriented to time. 12 lead at this time shows NSR. No neuro deficits noted. Resp e/u and skin warm and dry.

## 2013-10-20 NOTE — Consult Note (Signed)
Referring Physician: ED    Chief Complaint: syncope, stroke on CT brain  HPI:                                                                                                                                         Cesar Maldonado is an 80 y.o. male with a past medical history that is relevant for HTN, syncope, AD, ischemic stroke, and BPH, brought in by wife after sustaining an episode of transient loss of consciousness at home. Mr. Catanzaro said that he doesn't recall what happened and why he is in the hospital, but his wife is at the bedside and stated that they were home, patient went to the bathroom by himself after eating dinner, then she heard a thud and found him unresponsive on the floor. He vomited but did not have seizure like activity. He did not bite his tongue. No loss of control of bladder or bowel moments. EMS was called and found that patient had bradycardic in the 30s. Pacing was entertained but his mental status and heart rate improved. According to his wife, there has been no interval changes in his clinical conditions and he was doing quite well until today. No recent fever, change in medications. CT brain upon arrival showed findings consistent with small infarcts in the mid left thalamus and posterior right  subthalamus.  Serologies unrevealing except for elevated alcohol level. UA pending. On ASA 81 mg daily. Date last known well: 10/20/13 Time last known well: uncertain tPA Given: no, out of the window   Past Medical History  Diagnosis Date  . Mental disorder     alzhiemers  . Hypertension   . Dementia   . BPH (benign prostatic hyperplasia)   . Syncope   . Left ventricular diastolic dysfunction, NYHA class 1   . Cerebrovascular disease     Chronic ischemic microvascular white matter disease and central atrophy on CT scan    Past Surgical History  Procedure Laterality Date  . Breast surgery      left tumor  . Pars plana vitrectomy  12/19/2010    Procedure: PARS  PLANA VITRECTOMY WITH INTRAOCULAR LENS;  Surgeon: Adonis Brook, MD;  Location: [facility];  Service: Ophthalmology;  Laterality: Right;  PARS PLANA VITRECTOMY WITH INTRAOCULAR LENS [900]membrane peel,endolaser right eye    History reviewed. No pertinent family history. Social History:  reports that he has never smoked. He does not have any smokeless tobacco history on file. He reports that he drinks about 1.2 ounces of alcohol per week. He reports that he does not use illicit drugs.  Allergies: No Known Allergies  Medications:  I have reviewed the patient's current medications.  ROS: unable to obtain from the patient due to mental status                                                                                      History obtained from chart review and wife   Physical exam: drowsy, pleasant male in no apparent distress. Blood pressure 127/64, pulse 102, resp. rate 20, SpO2 95.00%. Head: normocephalic. Neck: supple, no bruits, no JVD. Cardiac: no murmurs. Lungs: clear. Abdomen: soft, no tender, no mass. Extremities: no edema. Neurologic Examination:                                                                                                      General: Mental Status: Drowsy, but open hie eyes and follows simple commands inconsistently.  Speech fluent without evidence of aphasia. Cranial Nerves: II: Discs flat bilaterally; Visual fields couldn't be assessed, pupils equal, round, reactive to light III,IV, VI: ptosis not present, extra-ocular motions intact bilaterally V,VII: smile symmetric, facial light touch sensation normal bilaterally VIII: hearing normal bilaterally IX,X: gag reflex present XI: bilateral shoulder shrug XII: midline tongue extension without atrophy or fasciculations Motor: Right : Upper extremity   5/5    Left:     Upper extremity    5/5  Lower extremity   5/5     Lower extremity   5/5 Tone and bulk:normal tone throughout; no atrophy noted Sensory: Pinprick and light touch intact throughout, bilaterally Deep Tendon Reflexes:  1+ upper extremities and 2+lower extremities. Plantars: Right: downgoing   Left: upgoing Cerebellar: normal finger-to-nose,  normal heel-to-shin test Gait:  Unable to test due to safety reasons  Results for orders placed during the hospital encounter of 10/20/13 (from the past 48 hour(s))  CBC WITH DIFFERENTIAL     Status: Abnormal   Collection Time    10/20/13  6:47 PM      Result Value Ref Range   WBC 4.5  4.0 - 10.5 K/uL   RBC 3.89 (*) 4.22 - 5.81 MIL/uL   Hemoglobin 11.0 (*) 13.0 - 17.0 g/dL   HCT 33.1 (*) 39.0 - 52.0 %   MCV 85.1  78.0 - 100.0 fL   MCH 28.3  26.0 - 34.0 pg   MCHC 33.2  30.0 - 36.0 g/dL   RDW 14.0  11.5 - 15.5 %   Platelets 172  150 - 400 K/uL   Neutrophils Relative % 68  43 - 77 %   Neutro Abs 3.1  1.7 - 7.7 K/uL   Lymphocytes Relative 21  12 - 46 %   Lymphs Abs 1.0  0.7 - 4.0 K/uL   Monocytes Relative 11  3 - 12 %   Monocytes Absolute  0.5  0.1 - 1.0 K/uL   Eosinophils Relative 0  0 - 5 %   Eosinophils Absolute 0.0  0.0 - 0.7 K/uL   Basophils Relative 0  0 - 1 %   Basophils Absolute 0.0  0.0 - 0.1 K/uL  COMPREHENSIVE METABOLIC PANEL     Status: Abnormal   Collection Time    10/20/13  6:47 PM      Result Value Ref Range   Sodium 136 (*) 137 - 147 mEq/L   Potassium 4.2  3.7 - 5.3 mEq/L   Chloride 98  96 - 112 mEq/L   CO2 24  19 - 32 mEq/L   Glucose, Bld 98  70 - 99 mg/dL   BUN 11  6 - 23 mg/dL   Creatinine, Ser 1.13  0.50 - 1.35 mg/dL   Calcium 9.1  8.4 - 10.5 mg/dL   Total Protein 7.9  6.0 - 8.3 g/dL   Albumin 3.5  3.5 - 5.2 g/dL   AST 21  0 - 37 U/L   ALT 12  0 - 53 U/L   Alkaline Phosphatase 59  39 - 117 U/L   Total Bilirubin <0.2 (*) 0.3 - 1.2 mg/dL   GFR calc non Af Amer 56 (*) >90 mL/min   GFR calc Af Amer 65 (*) >90 mL/min   Comment: (NOTE)      The eGFR has been calculated using the CKD EPI equation.     This calculation has not been validated in all clinical situations.     eGFR's persistently <90 mL/min signify possible Chronic Kidney     Disease.   Anion gap 14  5 - 15  TROPONIN I     Status: None   Collection Time    10/20/13  6:47 PM      Result Value Ref Range   Troponin I <0.30  <0.30 ng/mL   Comment:            Due to the release kinetics of cTnI,     a negative result within the first hours     of the onset of symptoms does not rule out     myocardial infarction with certainty.     If myocardial infarction is still suspected,     repeat the test at appropriate intervals.  ETHANOL     Status: Abnormal   Collection Time    10/20/13  6:47 PM      Result Value Ref Range   Alcohol, Ethyl (B) 135 (*) 0 - 11 mg/dL   Comment:            LOWEST DETECTABLE LIMIT FOR     SERUM ALCOHOL IS 11 mg/dL     FOR MEDICAL PURPOSES ONLY   Dg Chest 2 View  10/20/2013   CLINICAL DATA:  Syncope and altered mental status  EXAM: CHEST  2 VIEW  COMPARISON:  February 24, 2012  FINDINGS: There is underlying emphysema. There is a 2.7 x 2.7 cm nodular appearing lesion in the right upper lobe. Elsewhere lungs are clear. Heart size and pulmonary vascularity are normal. No adenopathy. There is degenerative change in the thoracic spine. There is an old healed fracture of the posterior left tenth rib.  IMPRESSION: 2.7 x 2.7 cm nodular appearing lesion right upper lobe. While this area could represent early pneumonia, its nodular appearance raises concern for potential neoplasm. Noncontrast enhanced chest CT advised to further assess.  Underlying emphysema.  These results were called by telephone  at the time of interpretation on 10/20/2013 at 8:40 pm to Dr. Ezequiel Essex , who verbally acknowledged these results.   Electronically Signed   By: Lowella Grip M.D.   On: 10/20/2013 20:41   Ct Head Wo Contrast  10/20/2013   CLINICAL DATA:  Patient  found unresponsive by wife ; lethargic with altered mental status  EXAM: CT HEAD WITHOUT CONTRAST  TECHNIQUE: Contiguous axial images were obtained from the base of the skull through the vertex without intravenous contrast.  COMPARISON:  February 24, 2012  FINDINGS: There is stable moderately severe diffuse atrophy. There is no mass, hemorrhage, extra-axial fluid collection, or midline shift. There is evidence of a prior infarct in the left lentiform nucleus, stable. There are small prior lacunar infarcts in each internal and external capsule. There is a small lacunar infarct in the mid left thalamus as well as a small infarct in the posterior right subthalamus, not seen previously and possibly recent. There is patchy small vessel disease throughout the centra semiovale bilaterally, stable.  Bony calvarium appears intact. Visualized mastoid air cells are clear. There is bilateral ethmoid sinus disease as well as mucosal thickening in each sphenoid sinus, slightly more on the left than on the right.  IMPRESSION: Small infarcts in the mid left thalamus and posterior right subthalamus not present previously. Age of these infarcts is uncertain. Other infarcts and small vessel disease appear stable compared to most recent prior study. Moderately severe diffuse atrophy is stable. No hemorrhage or mass effect. There is paranasal sinus disease as described.   Electronically Signed   By: Lowella Grip M.D.   On: 10/20/2013 19:56   Ct Chest Wo Contrast  10/20/2013   CLINICAL DATA:  Abnormal chest radiograph.  EXAM: CT CHEST WITHOUT CONTRAST  TECHNIQUE: Multidetector CT imaging of the chest was performed following the standard protocol without IV contrast.  COMPARISON:  10/20/2013; 02/24/2012  FINDINGS: Examination is degraded secondary to patient respiratory artifact.  There are ill-defined heterogeneous airspace opacities within the right upper lobe which correlate with the findings on recently performed chest  radiograph. Additionally, there are ill-defined interstitial airspace opacities within the bilateral lower lobes as well as the about the left hilum. Constellation of findings are worrisome for multifocal infection.  No pleural effusion or pneumothorax. The central pulmonary airways are widely patent. No discrete pulmonary nodules given limitation of the examination.  Scattered shotty mediastinal lymph nodes are individually not enlarged by size criteria with index precarinal lymph node measuring 0.6 cm in greatest short axis diameter (image 19, series 2). No definite mediastinal, hilar or axillary lymphadenopathy on this noncontrast examination.  Normal heart size. No pericardial effusion. Scattered atherosclerotic plaque with a normal caliber thoracic aorta. Conventional configuration of the aortic arch.  Noncontrast evaluation of the upper abdomen demonstrates an approximately 0.7 cm hypo attenuating lesion within the dome of the right lobe of the liver, too small to adequately characterize of favored to represent a hepatic cyst. There is an additional approximately 1 cm hypo attenuating lesion within in the imaged left lobe of the liver which is incompletely imaged though also favored to represent hepatic cysts.  No definite acute or aggressive osseous abnormalities. The thyroid gland appears diminutive but otherwise normal.  IMPRESSION: Findings worrisome for multi lobar infection with possible etiologies including atypical organisms. A follow-up chest radiograph in 4-6 weeks after resolution is recommended to ensure resolution. No discrete pulmonary mass.   Electronically Signed   By: Eldridge Abrahams.D.  On: 10/20/2013 21:45    Assessment: 80 y.o. male admitted after sustaining a cardiac syncopal episode at home. CT findings suggestive of small infarcts in the mid left thalamus and posterior right subthalamus, most likely an incidental finding unrelated to patient's clinical presentation.  Patient advanced  age with underlying dementia make me wonder if further stroke work up is indicated as it will probably not change medical management, and thus will only suggest MRI brain. Consider switching to plavix if MRI confirms new subcortical infarct. Will follow up.  Dorian Pod, MD [facility] [phone]  10/20/2013, 11:33 PM

## 2013-10-20 NOTE — ED Provider Notes (Signed)
CSN: [id number]     Arrival date & time 10/20/13  1827 History   First MD Initiated Contact with Patient 10/20/13 1834     Chief Complaint  Patient presents with  . Loss of Consciousness     (Consider location/radiation/quality/duration/timing/severity/associated sxs/prior Treatment) HPI Comments: Patient from home after apparent syncopal episode. He has a history dementia, level V caveat. Wife states they just finished eating dinner when patient went to the bathroom and she heard a thud. She found him on the floor unresponsive and EMS was called. On EMS arrival he was minimally responsive and bradycardic in the 30s. pacing was entertained but his mental status and heart rate improved. Patient is now awake and alert. He is oriented x3. knee denies any pain. No headache, chest pain or shortness of breath. Denies any nausea or vomiting but wife reports he did have some vomiting after he passed out. Previous syncope 2012 negative workup. No history of seizures.   The history is provided by the patient, the EMS personnel and a relative. The history is limited by the condition of the patient.    Past Medical History  Diagnosis Date  . Mental disorder     alzhiemers  . Hypertension   . Dementia   . BPH (benign prostatic hyperplasia)   . Syncope   . Left ventricular diastolic dysfunction, NYHA class 1   . Cerebrovascular disease     Chronic ischemic microvascular white matter disease and central atrophy on CT scan   Past Surgical History  Procedure Laterality Date  . Breast surgery      left tumor  . Pars plana vitrectomy  12/19/2010    Procedure: PARS PLANA VITRECTOMY WITH INTRAOCULAR LENS;  Surgeon: Shade Flood, MD;  Location: [facility] OR;  Service: Ophthalmology;  Laterality: Right;  PARS PLANA VITRECTOMY WITH INTRAOCULAR LENS [900]membrane peel,endolaser right eye   History reviewed. No pertinent family history. History  Substance Use Topics  . Smoking status: Never Smoker   . Smokeless  tobacco: Not on file  . Alcohol Use: 1.2 oz/week    2 Shots of liquor per week    Review of Systems  Unable to perform ROS: Dementia  Cardiovascular: Positive for syncope.      Allergies  Review of patient's allergies indicates no known allergies.  Home Medications   Prior to Admission medications   Medication Sig Start Date End Date Taking? Authorizing Provider  aspirin 81 MG tablet Take 81 mg by mouth daily.   Yes Historical Provider, MD  Cholecalciferol (VITAMIN D PO) Take 1 tablet by mouth daily.   Yes Historical Provider, MD  dorzolamide-timolol (COSOPT) 22.3-6.8 MG/ML ophthalmic solution 1 drop 2 (two) times daily.   Yes Historical Provider, MD  latanoprost (XALATAN) 0.005 % ophthalmic solution Place 1 drop into both eyes at bedtime.   Yes Historical Provider, MD  Multiple Vitamins-Minerals (CENTRUM PO) Take by mouth.   Yes Historical Provider, MD   BP 127/64  Pulse 102  Resp 20  SpO2 95% Physical Exam  Nursing note and vitals reviewed. Constitutional: He is oriented to person, place, and time. He appears well-developed and well-nourished. No distress.  Mild tachypnea  HENT:  Head: Normocephalic and atraumatic.  Mouth/Throat: Oropharynx is clear and moist. No oropharyngeal exudate.  Eyes: Conjunctivae and EOM are normal. Pupils are equal, round, and reactive to light.  Neck: Normal range of motion. Neck supple.  No C spine tenderness  Cardiovascular: Normal rate, regular rhythm, normal heart sounds and intact distal pulses.  No murmur heard. Pulmonary/Chest: Effort normal and breath sounds normal. No respiratory distress.  Decreased breath sounds throughout.  Abdominal: Soft. There is no tenderness. There is no rebound and no guarding.  Musculoskeletal: Normal range of motion. He exhibits no edema and no tenderness.  Neurological: He is alert and oriented to person, place, and time. No cranial nerve deficit. He exhibits normal muscle tone. Coordination normal.  No  ataxia on finger to nose bilaterally. No pronator drift. 5/5 strength throughout. CN 2-12 intact.Equal grip strength. Sensation intact. Gait is not tested.  Skin: Skin is warm.  Psychiatric: He has a normal mood and affect. His behavior is normal.    ED Course  Procedures (including critical care time) Labs Review Labs Reviewed  CBC WITH DIFFERENTIAL - Abnormal; Notable for the following:    RBC 3.89 (*)    Hemoglobin 11.0 (*)    HCT 33.1 (*)    All other components within normal limits  COMPREHENSIVE METABOLIC PANEL - Abnormal; Notable for the following:    Sodium 136 (*)    Total Bilirubin <0.2 (*)    GFR calc non Af Amer 56 (*)    GFR calc Af Amer 65 (*)    All other components within normal limits  ETHANOL - Abnormal; Notable for the following:    Alcohol, Ethyl (B) 135 (*)    All other components within normal limits  TROPONIN I  URINALYSIS, ROUTINE W REFLEX MICROSCOPIC    Imaging Review Dg Chest 2 View  10/20/2013   CLINICAL DATA:  Syncope and altered mental status  EXAM: CHEST  2 VIEW  COMPARISON:  February 24, 2012  FINDINGS: There is underlying emphysema. There is a 2.7 x 2.7 cm nodular appearing lesion in the right upper lobe. Elsewhere lungs are clear. Heart size and pulmonary vascularity are normal. No adenopathy. There is degenerative change in the thoracic spine. There is an old healed fracture of the posterior left tenth rib.  IMPRESSION: 2.7 x 2.7 cm nodular appearing lesion right upper lobe. While this area could represent early pneumonia, its nodular appearance raises concern for potential neoplasm. Noncontrast enhanced chest CT advised to further assess.  Underlying emphysema.  These results were called by telephone at the time of interpretation on 10/20/2013 at 8:40 pm to Dr. Glynn Octave , who verbally acknowledged these results.   Electronically Signed   By: Bretta Bang M.D.   On: 10/20/2013 20:41   Ct Head Wo Contrast  10/20/2013   CLINICAL DATA:   Patient found unresponsive by wife ; lethargic with altered mental status  EXAM: CT HEAD WITHOUT CONTRAST  TECHNIQUE: Contiguous axial images were obtained from the base of the skull through the vertex without intravenous contrast.  COMPARISON:  February 24, 2012  FINDINGS: There is stable moderately severe diffuse atrophy. There is no mass, hemorrhage, extra-axial fluid collection, or midline shift. There is evidence of a prior infarct in the left lentiform nucleus, stable. There are small prior lacunar infarcts in each internal and external capsule. There is a small lacunar infarct in the mid left thalamus as well as a small infarct in the posterior right subthalamus, not seen previously and possibly recent. There is patchy small vessel disease throughout the centra semiovale bilaterally, stable.  Bony calvarium appears intact. Visualized mastoid air cells are clear. There is bilateral ethmoid sinus disease as well as mucosal thickening in each sphenoid sinus, slightly more on the left than on the right.  IMPRESSION: Small infarcts in the mid  left thalamus and posterior right subthalamus not present previously. Age of these infarcts is uncertain. Other infarcts and small vessel disease appear stable compared to most recent prior study. Moderately severe diffuse atrophy is stable. No hemorrhage or mass effect. There is paranasal sinus disease as described.   Electronically Signed   By: Bretta BangWilliam  Woodruff M.D.   On: 10/20/2013 19:56   Ct Chest Wo Contrast  10/20/2013   CLINICAL DATA:  Abnormal chest radiograph.  EXAM: CT CHEST WITHOUT CONTRAST  TECHNIQUE: Multidetector CT imaging of the chest was performed following the standard protocol without IV contrast.  COMPARISON:  10/20/2013; 02/24/2012  FINDINGS: Examination is degraded secondary to patient respiratory artifact.  There are ill-defined heterogeneous airspace opacities within the right upper lobe which correlate with the findings on recently performed chest  radiograph. Additionally, there are ill-defined interstitial airspace opacities within the bilateral lower lobes as well as the about the left hilum. Constellation of findings are worrisome for multifocal infection.  No pleural effusion or pneumothorax. The central pulmonary airways are widely patent. No discrete pulmonary nodules given limitation of the examination.  Scattered shotty mediastinal lymph nodes are individually not enlarged by size criteria with index precarinal lymph node measuring 0.6 cm in greatest short axis diameter (image 19, series 2). No definite mediastinal, hilar or axillary lymphadenopathy on this noncontrast examination.  Normal heart size. No pericardial effusion. Scattered atherosclerotic plaque with a normal caliber thoracic aorta. Conventional configuration of the aortic arch.  Noncontrast evaluation of the upper abdomen demonstrates an approximately 0.7 cm hypo attenuating lesion within the dome of the right lobe of the liver, too small to adequately characterize of favored to represent a hepatic cyst. There is an additional approximately 1 cm hypo attenuating lesion within in the imaged left lobe of the liver which is incompletely imaged though also favored to represent hepatic cysts.  No definite acute or aggressive osseous abnormalities. The thyroid gland appears diminutive but otherwise normal.  IMPRESSION: Findings worrisome for multi lobar infection with possible etiologies including atypical organisms. A follow-up chest radiograph in 4-6 weeks after resolution is recommended to ensure resolution. No discrete pulmonary mass.   Electronically Signed   By: Simonne ComeJohn  Watts M.D.   On: 10/20/2013 21:45     EKG Interpretation   Date/Time:  Wednesday October 20 2013 18:34:47 EDT Ventricular Rate:  74 PR Interval:  206 QRS Duration: 89 QT Interval:  389 QTC Calculation: 432 R Axis:   84 Text Interpretation:  Sinus rhythm Borderline right axis deviation No  significant change  was found Confirmed by Manus GunningANCOUR  MD, Franciszek Platten [phone]([id number]) on  10/20/2013 6:44:39 PM      MDM   Final diagnoses:  Syncope, unspecified syncope type  Stroke  CAP (community acquired pneumonia)   Unwitnessed syncopal episode at home. Positive loss of consciousness. Patient now awake and alert. No chest pain or shortness of breath. Transient bradycardia at home.  Nonfocal neuro exam. Alcohol intoxication noted. Orthostatics negative. CT head with subacute-appearing small infarcts. No hemorrhage.  Nodular lesion on chest x-ray. CT scan will be obtained. CT scan shows multilobar infection and lung parenchyma. Given patient's alcohol abuse, we'll treat for aspiration pneumonia. He denies any shortness of breath but oxygenation has decreased into the high 80s. Place a nasal cannula 2 L. No distress. Denies any chest pain.  Admission for syncope and pneumonia discussed with Dr. Clyde LundborgNiu. Dr. Leroy Kennedyamilo to consult for infarcts of indeterminate age.     Glynn OctaveStephen Bertran Zeimet, MD 10/20/13 [phone]

## 2013-10-20 NOTE — ED Notes (Signed)
Pts O2 sat 86% on RA. Pt placed on 3 L and now O2 >90%.

## 2013-10-20 NOTE — ED Notes (Signed)
Pt wife upset, states patient is wet and has not been changed. Pt wife informed that no one has alerted the staff that he has been wet. Pt to be cleaned now.

## 2013-10-20 NOTE — H&P (Signed)
[facility] History and Physical  GAURAV BALDREE BJY:[id number] DOB: Jul 16, 1924 DOA: 10/20/2013  Referring physician: ED physician PCP: Laurena Slimmer, MD  Specialists:   Chief Complaint: Syncope  HPI: Cesar Maldonado is a 80 y.o. male with past medical history of CVS, dementia, hypertension, mild diastolic congestive heart failure, syncope, who presents with syncope episode.  Per patient's wife, patient went to the bathroom by himself after eating dinner, then his wife heard a thud and found patient was on the floor. He was unresponsive and vomited non-bloody vomitus. Patient did not have a seizure activity. He did not bite his tongue. No loss of control of bladder or bowel moments.   EMS was called and found that patient had bradycardic in the 30s. Pacing was entertained but his mental status and heart rate improved. He was brought to ED. After arrival to ED, patient becomes more alter and awake. His wife states that he has been coughing in the past several days. Patient does not have fever, chills, chest pain, shortness of breath, abdominal pain or diarrhea. He does not have weakness, numbness or tingling sensations in his extremities. Of note, patient had similar syncope 2014 with negative workup, including negative CT-head and telemetry monitoring.   In ED, CT-head showed a small infarcts in the mid left thalamus and posterior right subthalamus which was not present previously. Age of these infarcts is uncertain per radiologist. CT-chest showed multi lobar infiltration. Patient is admitted to inpatient for further evaluation and treatment.  Review of Systems: As presented in the history of presenting illness, rest negative.  Where does patient live?  Lives with his wife at home Can patient participate in ADLs? Yes  Allergy: No Known Allergies  Past Medical History  Diagnosis Date  . Mental disorder     alzhiemers  . Hypertension   . Dementia   . BPH (benign prostatic  hyperplasia)   . Syncope   . Left ventricular diastolic dysfunction, NYHA class 1   . Cerebrovascular disease     Chronic ischemic microvascular white matter disease and central atrophy on CT scan    Past Surgical History  Procedure Laterality Date  . Breast surgery      left tumor  . Pars plana vitrectomy  12/19/2010    Procedure: PARS PLANA VITRECTOMY WITH INTRAOCULAR LENS;  Surgeon: Shade Flood, MD;  Location: [facility] OR;  Service: Ophthalmology;  Laterality: Right;  PARS PLANA VITRECTOMY WITH INTRAOCULAR LENS [900]membrane peel,endolaser right eye    Social History:  reports that he has never smoked. He does not have any smokeless tobacco history on file. He reports that he drinks about 1.2 ounces of alcohol per week. He reports that he does not use illicit drugs.  Family History: History reviewed. No pertinent family history.   Prior to Admission medications   Medication Sig Start Date End Date Taking? Authorizing Provider  aspirin 81 MG tablet Take 81 mg by mouth daily.   Yes Historical Provider, MD  Cholecalciferol (VITAMIN D PO) Take 1 tablet by mouth daily.   Yes Historical Provider, MD  dorzolamide-timolol (COSOPT) 22.3-6.8 MG/ML ophthalmic solution 1 drop 2 (two) times daily.   Yes Historical Provider, MD  latanoprost (XALATAN) 0.005 % ophthalmic solution Place 1 drop into both eyes at bedtime.   Yes Historical Provider, MD  Multiple Vitamins-Minerals (CENTRUM PO) Take by mouth.   Yes Historical Provider, MD    Physical Exam: Filed Vitals:   10/20/13 2200 10/20/13 2230 10/20/13 2300 10/20/13 2330  BP:  182/91 144/74 123/60 127/64  Pulse: 95 89 97 102  Resp: 24 23 18 20   SpO2: 94% 93% 92% 95%   General: Not in acute distress HEENT:       Eyes: PERRL, EOMI, no scleral icterus       ENT: No discharge from the ears and nose, no pharynx injection, no tonsillar enlargement.        Neck: No JVD, no bruit, no mass felt. Cardiac: S1/S2, RRR, No murmurs, gallops or rubs Pulm:  Dereased air movement bilaterally.  No rales, wheezing, rhonchi or rubs. Abd: Soft, nondistended, nontender, no rebound pain, no organomegaly, BS present Ext: No edema. 2+DP/PT pulse bilaterally Musculoskeletal: No joint deformities, erythema, or stiffness, ROM full Skin: No rashes.  Neuro: Alert and oriented only to time (knows today is Wensday) , cranial nerves II-XII grossly intact, muscle strength 5/5 in all extremeties, sensation to light touch intact. Brachial reflex 2+ bilaterally. Knee reflex 1+ bilaterally. Negative Babinski's sign. Normal finger to nose test. Psych: Patient is not psychotic, no suicidal or hemocidal ideation.  Labs on Admission:  Basic Metabolic Panel:  Recent Labs Lab 10/20/13 1847  NA 136*  K 4.2  CL 98  CO2 24  GLUCOSE 98  BUN 11  CREATININE 1.13  CALCIUM 9.1   Liver Function Tests:  Recent Labs Lab 10/20/13 1847  AST 21  ALT 12  ALKPHOS 59  BILITOT <0.2*  PROT 7.9  ALBUMIN 3.5   No results found for this basename: LIPASE, AMYLASE,  in the last 168 hours No results found for this basename: AMMONIA,  in the last 168 hours CBC:  Recent Labs Lab 10/20/13 1847  WBC 4.5  NEUTROABS 3.1  HGB 11.0*  HCT 33.1*  MCV 85.1  PLT 172   Cardiac Enzymes:  Recent Labs Lab 10/20/13 1847  TROPONINI <0.30    BNP (last 3 results) No results found for this basename: PROBNP,  in the last 8760 hours CBG: No results found for this basename: GLUCAP,  in the last 168 hours  Radiological Exams on Admission: Dg Chest 2 View  10/20/2013   CLINICAL DATA:  Syncope and altered mental status  EXAM: CHEST  2 VIEW  COMPARISON:  February 24, 2012  FINDINGS: There is underlying emphysema. There is a 2.7 x 2.7 cm nodular appearing lesion in the right upper lobe. Elsewhere lungs are clear. Heart size and pulmonary vascularity are normal. No adenopathy. There is degenerative change in the thoracic spine. There is an old healed fracture of the posterior left tenth  rib.  IMPRESSION: 2.7 x 2.7 cm nodular appearing lesion right upper lobe. While this area could represent early pneumonia, its nodular appearance raises concern for potential neoplasm. Noncontrast enhanced chest CT advised to further assess.  Underlying emphysema.  These results were called by telephone at the time of interpretation on 10/20/2013 at 8:40 pm to Dr. Glynn Octave , who verbally acknowledged these results.   Electronically Signed   By: Bretta Bang M.D.   On: 10/20/2013 20:41   Ct Head Wo Contrast  10/20/2013   CLINICAL DATA:  Patient found unresponsive by wife ; lethargic with altered mental status  EXAM: CT HEAD WITHOUT CONTRAST  TECHNIQUE: Contiguous axial images were obtained from the base of the skull through the vertex without intravenous contrast.  COMPARISON:  February 24, 2012  FINDINGS: There is stable moderately severe diffuse atrophy. There is no mass, hemorrhage, extra-axial fluid collection, or midline shift. There is evidence of a prior  infarct in the left lentiform nucleus, stable. There are small prior lacunar infarcts in each internal and external capsule. There is a small lacunar infarct in the mid left thalamus as well as a small infarct in the posterior right subthalamus, not seen previously and possibly recent. There is patchy small vessel disease throughout the centra semiovale bilaterally, stable.  Bony calvarium appears intact. Visualized mastoid air cells are clear. There is bilateral ethmoid sinus disease as well as mucosal thickening in each sphenoid sinus, slightly more on the left than on the right.  IMPRESSION: Small infarcts in the mid left thalamus and posterior right subthalamus not present previously. Age of these infarcts is uncertain. Other infarcts and small vessel disease appear stable compared to most recent prior study. Moderately severe diffuse atrophy is stable. No hemorrhage or mass effect. There is paranasal sinus disease as described.    Electronically Signed   By: Bretta Bang M.D.   On: 10/20/2013 19:56   Ct Chest Wo Contrast  10/20/2013   CLINICAL DATA:  Abnormal chest radiograph.  EXAM: CT CHEST WITHOUT CONTRAST  TECHNIQUE: Multidetector CT imaging of the chest was performed following the standard protocol without IV contrast.  COMPARISON:  10/20/2013; 02/24/2012  FINDINGS: Examination is degraded secondary to patient respiratory artifact.  There are ill-defined heterogeneous airspace opacities within the right upper lobe which correlate with the findings on recently performed chest radiograph. Additionally, there are ill-defined interstitial airspace opacities within the bilateral lower lobes as well as the about the left hilum. Constellation of findings are worrisome for multifocal infection.  No pleural effusion or pneumothorax. The central pulmonary airways are widely patent. No discrete pulmonary nodules given limitation of the examination.  Scattered shotty mediastinal lymph nodes are individually not enlarged by size criteria with index precarinal lymph node measuring 0.6 cm in greatest short axis diameter (image 19, series 2). No definite mediastinal, hilar or axillary lymphadenopathy on this noncontrast examination.  Normal heart size. No pericardial effusion. Scattered atherosclerotic plaque with a normal caliber thoracic aorta. Conventional configuration of the aortic arch.  Noncontrast evaluation of the upper abdomen demonstrates an approximately 0.7 cm hypo attenuating lesion within the dome of the right lobe of the liver, too small to adequately characterize of favored to represent a hepatic cyst. There is an additional approximately 1 cm hypo attenuating lesion within in the imaged left lobe of the liver which is incompletely imaged though also favored to represent hepatic cysts.  No definite acute or aggressive osseous abnormalities. The thyroid gland appears diminutive but otherwise normal.  IMPRESSION: Findings  worrisome for multi lobar infection with possible etiologies including atypical organisms. A follow-up chest radiograph in 4-6 weeks after resolution is recommended to ensure resolution. No discrete pulmonary mass.   Electronically Signed   By: Simonne Come M.D.   On: 10/20/2013 21:45    EKG: Independently reviewed.   Assessment/Plan Principal Problem:   Syncope and collapse Active Problems:   Hypertension   Dementia   BPH (benign prostatic hyperplasia)   Cerebrovascular disease   Syncope   CAP (community acquired pneumonia)  #: Syncope: likely due to vasovagal event given his bradycardia. CT-head showed small infarcts in the mid left thalamus and posterior right subthalamus not present previously. Age of these infarcts is uncertain per radiologist.  On ASA 81 mg daily at home. Neurologist was consulted by ED. Dr. Leroy Kennedy thought that the CT findings are most likely an incidental finding unrelated to patient's clinical presentation, suggested MRI brain. Consider switching  to plavix if MRI confirms new subcortical infarct per Dr. Leroy Kennedyamilo  - will admit to tele bed. - Follow up neurology recs - MRI-brain - continue home ASA - cycle cardiac markers q. 6 hours x3 sets.  - Check TSH. - Hydrate and check orthostatics.  #: Pneumonia: It is most likely community-acquired pneumonia, but aspiration cannot be ruled out given he vomited.  - IV Levaquin and zosyn - blood culture and sputum culture - check urine strep and legionella antigen.  #; Hypertension: Bp is 162/92 mmHG on admission. Not on any antihypertensives at home. - will monitor blood pressure.  #: Left ventricular diastolic dysfunction, NYHA class 1: 2-D echo on 04/03/12 showed EF 50-55% with grade 1 diastolic dysfunction. Currently patient is euvolemic. - will monitor volume status closely - repeat 2D echo  DVT ppx: SQ Heparin   Code Status: Full code Family Communication: Yes, patient's wife at bed side Disposition Plan: Admit  to inpatient  Lorretta HarpIU, Cherry Wittwer [facility] Pager [phone]  If 7PM-7AM, please contact night-coverage [email or web address] Password [email or web address] 10/21/2013, 1:43 AM

## 2013-10-20 NOTE — ED Notes (Signed)
The patient is unable to give an urine specimen at this time. The tech has reported to the RN in charge. 

## 2013-10-21 ENCOUNTER — Inpatient Hospital Stay (HOSPITAL_COMMUNITY): Payer: Medicare Other

## 2013-10-21 DIAGNOSIS — N183 Chronic kidney disease, stage 3 unspecified: Secondary | ICD-10-CM | POA: Diagnosis present

## 2013-10-21 DIAGNOSIS — R780 Finding of alcohol in blood: Secondary | ICD-10-CM

## 2013-10-21 DIAGNOSIS — E860 Dehydration: Secondary | ICD-10-CM

## 2013-10-21 DIAGNOSIS — I10 Essential (primary) hypertension: Secondary | ICD-10-CM

## 2013-10-21 DIAGNOSIS — I5032 Chronic diastolic (congestive) heart failure: Secondary | ICD-10-CM

## 2013-10-21 DIAGNOSIS — R930 Abnormal findings on diagnostic imaging of skull and head, not elsewhere classified: Secondary | ICD-10-CM | POA: Diagnosis present

## 2013-10-21 DIAGNOSIS — J9601 Acute respiratory failure with hypoxia: Secondary | ICD-10-CM | POA: Diagnosis present

## 2013-10-21 DIAGNOSIS — R001 Bradycardia, unspecified: Secondary | ICD-10-CM | POA: Diagnosis present

## 2013-10-21 DIAGNOSIS — I369 Nonrheumatic tricuspid valve disorder, unspecified: Secondary | ICD-10-CM

## 2013-10-21 DIAGNOSIS — F1027 Alcohol dependence with alcohol-induced persisting dementia: Secondary | ICD-10-CM

## 2013-10-21 LAB — CBC WITH DIFFERENTIAL/PLATELET
BASOS ABS: 0 10*3/uL (ref 0.0–0.1)
Basophils Relative: 0 % (ref 0–1)
EOS ABS: 0.1 10*3/uL (ref 0.0–0.7)
Eosinophils Relative: 1 % (ref 0–5)
HEMATOCRIT: 32.9 % — AB (ref 39.0–52.0)
Hemoglobin: 11.2 g/dL — ABNORMAL LOW (ref 13.0–17.0)
LYMPHS PCT: 5 % — AB (ref 12–46)
Lymphs Abs: 0.5 10*3/uL — ABNORMAL LOW (ref 0.7–4.0)
MCH: 28.9 pg (ref 26.0–34.0)
MCHC: 34 g/dL (ref 30.0–36.0)
MCV: 84.8 fL (ref 78.0–100.0)
MONOS PCT: 5 % (ref 3–12)
Monocytes Absolute: 0.5 10*3/uL (ref 0.1–1.0)
Neutro Abs: 8.8 10*3/uL — ABNORMAL HIGH (ref 1.7–7.7)
Neutrophils Relative %: 89 % — ABNORMAL HIGH (ref 43–77)
PLATELETS: 140 10*3/uL — AB (ref 150–400)
RBC: 3.88 MIL/uL — ABNORMAL LOW (ref 4.22–5.81)
RDW: 14.1 % (ref 11.5–15.5)
WBC: 9.9 10*3/uL (ref 4.0–10.5)

## 2013-10-21 LAB — URINALYSIS, ROUTINE W REFLEX MICROSCOPIC
Bilirubin Urine: NEGATIVE
GLUCOSE, UA: NEGATIVE mg/dL
Ketones, ur: 15 mg/dL — AB
LEUKOCYTES UA: NEGATIVE
Nitrite: NEGATIVE
Protein, ur: NEGATIVE mg/dL
Specific Gravity, Urine: 1.02 (ref 1.005–1.030)
Urobilinogen, UA: 0.2 mg/dL (ref 0.0–1.0)
pH: 5 (ref 5.0–8.0)

## 2013-10-21 LAB — RAPID URINE DRUG SCREEN, HOSP PERFORMED
Amphetamines: NOT DETECTED
BARBITURATES: NOT DETECTED
Benzodiazepines: NOT DETECTED
Cocaine: NOT DETECTED
Opiates: NOT DETECTED
Tetrahydrocannabinol: NOT DETECTED

## 2013-10-21 LAB — INFLUENZA PANEL BY PCR (TYPE A & B)
H1N1FLUPCR: NOT DETECTED
Influenza A By PCR: NEGATIVE
Influenza B By PCR: NEGATIVE

## 2013-10-21 LAB — STREP PNEUMONIAE URINARY ANTIGEN: Strep Pneumo Urinary Antigen: NEGATIVE

## 2013-10-21 LAB — LACTIC ACID, PLASMA: LACTIC ACID, VENOUS: 2.6 mmol/L — AB (ref 0.5–2.2)

## 2013-10-21 LAB — URINE MICROSCOPIC-ADD ON

## 2013-10-21 LAB — COMPREHENSIVE METABOLIC PANEL
ALBUMIN: 2.9 g/dL — AB (ref 3.5–5.2)
ALK PHOS: 38 U/L — AB (ref 39–117)
ALT: 10 U/L (ref 0–53)
ANION GAP: 12 (ref 5–15)
AST: 21 U/L (ref 0–37)
BILIRUBIN TOTAL: 0.7 mg/dL (ref 0.3–1.2)
BUN: 14 mg/dL (ref 6–23)
CO2: 23 mEq/L (ref 19–32)
CREATININE: 1.23 mg/dL (ref 0.50–1.35)
Calcium: 8.5 mg/dL (ref 8.4–10.5)
Chloride: 100 mEq/L (ref 96–112)
GFR calc non Af Amer: 51 mL/min — ABNORMAL LOW (ref >90)
GFR, EST AFRICAN AMERICAN: 59 mL/min — AB (ref >90)
GLUCOSE: 96 mg/dL (ref 70–99)
POTASSIUM: 3.6 meq/L — AB (ref 3.7–5.3)
Sodium: 135 mEq/L — ABNORMAL LOW (ref 137–147)
Total Protein: 7 g/dL (ref 6.0–8.3)

## 2013-10-21 LAB — TROPONIN I

## 2013-10-21 LAB — ETHANOL

## 2013-10-21 MED ORDER — LEVOFLOXACIN IN D5W 750 MG/150ML IV SOLN
750.0000 mg | INTRAVENOUS | Status: DC
  Filled 2013-10-21 (×2): qty 150

## 2013-10-21 MED ORDER — LORAZEPAM 1 MG PO TABS: 1.0000 mg | ORAL_TABLET | Freq: Four times a day (QID) | ORAL | Status: AC | PRN

## 2013-10-21 MED ORDER — ACETAMINOPHEN 650 MG RE SUPP
650.0000 mg | Freq: Four times a day (QID) | RECTAL | Status: DC | PRN
  Filled 2013-10-21: qty 1

## 2013-10-21 MED ORDER — LORAZEPAM 2 MG/ML IJ SOLN: 1.0000 mg | Freq: Four times a day (QID) | INTRAMUSCULAR | Status: AC | PRN

## 2013-10-21 MED ORDER — ASPIRIN EC 81 MG PO TBEC
81.0000 mg | DELAYED_RELEASE_TABLET | Freq: Every day | ORAL | Status: DC
  Administered 2013-10-21 – 2013-10-25 (×5): 81 mg via ORAL
  Filled 2013-10-21 (×5): qty 1

## 2013-10-21 MED ORDER — FOLIC ACID 1 MG PO TABS
1.0000 mg | ORAL_TABLET | Freq: Every day | ORAL | Status: DC
  Administered 2013-10-21 – 2013-10-25 (×5): 1 mg via ORAL
  Filled 2013-10-21 (×5): qty 1

## 2013-10-21 MED ORDER — DEXTROSE 5 % IV SOLN
500.0000 mg | INTRAVENOUS | Status: DC
  Administered 2013-10-21 – 2013-10-24 (×4): 500 mg via INTRAVENOUS
  Filled 2013-10-21 (×5): qty 500

## 2013-10-21 MED ORDER — HEPARIN SODIUM (PORCINE) 5000 UNIT/ML IJ SOLN
5000.0000 [IU] | Freq: Three times a day (TID) | INTRAMUSCULAR | Status: DC
  Administered 2013-10-21 – 2013-10-24 (×9): 5000 [IU] via SUBCUTANEOUS
  Filled 2013-10-21 (×15): qty 1

## 2013-10-21 MED ORDER — DORZOLAMIDE HCL-TIMOLOL MAL 2-0.5 % OP SOLN
1.0000 [drp] | Freq: Two times a day (BID) | OPHTHALMIC | Status: DC
  Filled 2013-10-21: qty 10

## 2013-10-21 MED ORDER — THIAMINE HCL 100 MG/ML IJ SOLN
100.0000 mg | Freq: Every day | INTRAMUSCULAR | Status: DC
  Filled 2013-10-21 (×5): qty 1

## 2013-10-21 MED ORDER — DORZOLAMIDE HCL-TIMOLOL MAL 2-0.5 % OP SOLN
1.0000 [drp] | Freq: Two times a day (BID) | OPHTHALMIC | Status: DC
  Administered 2013-10-21 – 2013-10-25 (×10): 1 [drp] via OPHTHALMIC

## 2013-10-21 MED ORDER — PIPERACILLIN-TAZOBACTAM 3.375 G IVPB
3.3750 g | Freq: Three times a day (TID) | INTRAVENOUS | Status: DC
  Administered 2013-10-21: 3.375 g via INTRAVENOUS
  Filled 2013-10-21 (×3): qty 50

## 2013-10-21 MED ORDER — DEXTROSE 5 % IV SOLN
1.0000 g | INTRAVENOUS | Status: DC
  Administered 2013-10-21 – 2013-10-24 (×4): 1 g via INTRAVENOUS
  Filled 2013-10-21 (×6): qty 10

## 2013-10-21 MED ORDER — CHOLECALCIFEROL 10 MCG (400 UNIT) PO TABS
400.0000 [IU] | ORAL_TABLET | Freq: Every day | ORAL | Status: DC
Start: 2013-10-21 — End: 2013-10-25
  Administered 2013-10-21 – 2013-10-25 (×5): 400 [IU] via ORAL
  Filled 2013-10-21 (×5): qty 1

## 2013-10-21 MED ORDER — ADULT MULTIVITAMIN W/MINERALS CH
1.0000 | ORAL_TABLET | Freq: Every day | ORAL | Status: DC
  Administered 2013-10-21 – 2013-10-25 (×5): 1 via ORAL
  Filled 2013-10-21 (×5): qty 1

## 2013-10-21 MED ORDER — LATANOPROST 0.005 % OP SOLN
1.0000 [drp] | Freq: Every day | OPHTHALMIC | Status: DC
  Administered 2013-10-21 – 2013-10-24 (×5): 1 [drp] via OPHTHALMIC
  Filled 2013-10-21: qty 2.5

## 2013-10-21 MED ORDER — VITAMIN B-1 100 MG PO TABS
100.0000 mg | ORAL_TABLET | Freq: Every day | ORAL | Status: DC
Start: 2013-10-21 — End: 2013-10-25
  Administered 2013-10-21 – 2013-10-25 (×5): 100 mg via ORAL
  Filled 2013-10-21 (×5): qty 1

## 2013-10-21 NOTE — Care Management Note (Signed)
    Page 1 of 1   10/21/2013     10:30:08 AM CARE MANAGEMENT NOTE 10/21/2013  Patient:  Cesar Maldonado,Cesar Maldonado   Account Number:  [id number]  Date Initiated:  10/21/2013  Documentation initiated by:  Junius CreamerWELL,DEBBIE  Subjective/Objective Assessment:   adm w syncope     Action/Plan:   lives w wife   Anticipated DC Date:     Anticipated DC Plan:           Choice offered to / List presented to:             Status of service:   Medicare Important Message given?   (If response is "NO", the following Medicare IM given date fields will be blank) Date Medicare IM given:   Medicare IM given by:   Date Additional Medicare IM given:   Additional Medicare IM given by:    Discharge Disposition:    Per UR Regulation:  Reviewed for med. necessity/level of care/duration of stay  If discussed at Long Length of Stay Meetings, dates discussed:    Comments:

## 2013-10-21 NOTE — Evaluation (Signed)
Clinical/Bedside Swallow Evaluation Patient Details  Name: Cesar Maldonado MRN: [id number] Date of Birth: Nov 20, 1924  Today's Date: 10/21/2013 Time: [id number] SLP Time Calculation (min): 13 min  Past Medical History:  Past Medical History  Diagnosis Date  . Mental disorder     alzhiemers  . Hypertension   . Dementia   . BPH (benign prostatic hyperplasia)   . Syncope   . Left ventricular diastolic dysfunction, NYHA class 1   . Cerebrovascular disease     Chronic ischemic microvascular white matter disease and central atrophy on CT scan   Past Surgical History:  Past Surgical History  Procedure Laterality Date  . Breast surgery      left tumor  . Pars plana vitrectomy  12/19/2010    Procedure: PARS PLANA VITRECTOMY WITH INTRAOCULAR LENS;  Surgeon: Shade FloodGreer Geiger, MD;  [facility];  Service: Ophthalmology;  Laterality: Right;  PARS PLANA VITRECTOMY WITH INTRAOCULAR LENS [900]membrane peel,endolaser right eye   HPI:  80 y.o.  past medical history of CVS, dementia, hypertension, mild diastolic congestive heart failure, syncope admitted with syncopal episode  vomited non-bloody vomitus.  CT-head showed age undetermined  small infarcts in the mid left thalamus and posterior right subthalamus.  CXR findings worrisome for multi lobar infection with possible etiologies including atypical organisms.   Assessment / Plan / Recommendation Clinical Impression  No evidence of pharyngeal dysphagia observed; belch x 1, denies esophageal dysfunction. Reports meats need to be soft/cut at home. Possible he may have aspirated emesis from vomit causing his pna. SLP recommends Dys 3 texture, thin liquids, straws allowed and pills with liquid.  No further ST needed.     Aspiration Risk  Mild    Diet Recommendation Dysphagia 3 (Mechanical Soft);Thin liquid   Liquid Administration via: Cup;Straw Medication Administration: Whole meds with liquid Supervision: Patient able to self feed Compensations:  Slow rate;Small sips/bites Postural Changes and/or Swallow Maneuvers: Seated upright 90 degrees    Other  Recommendations Oral Care Recommendations: Oral care BID   Follow Up Recommendations  None    Frequency and Duration        Pertinent Vitals/Pain No pain         Swallow Study         Oral/Motor/Sensory Function Overall Oral Motor/Sensory Function: Appears within functional limits for tasks assessed   Ice Chips Ice chips: Not tested   Thin Liquid Thin Liquid: Within functional limits Presentation: Cup;Straw    Nectar Thick Nectar Thick Liquid: Not tested   Honey Thick Honey Thick Liquid: Not tested   Puree Puree: Within functional limits   Solid   GO    Solid: Within functional limits       Royce MacadamiaLitaker, Cyrus Ramsburg Willis 10/21/2013,11:49 AM  Breck CoonsLisa Willis Lonell FaceLitaker M.Ed [employer] Pager [phone]

## 2013-10-21 NOTE — Progress Notes (Signed)
MRI - no new stroke. Dr Pearlean BrownieSethi discussed with critical care PA who felt stroke service was no longer needed. We will sign off. Did not see pt. today. No bill will be submitted.  Delton Seeavid Rinehuls PA-C [facility] Pager [phone] 10/21/2013, 11:32 AM

## 2013-10-21 NOTE — Progress Notes (Signed)
ANTIBIOTIC CONSULT NOTE - INITIAL  Pharmacy Consult for levaquin and zosyn  Indication: pneumonia  No Known Allergies  Patient Measurements:   Adjusted Body Weight:   Vital Signs: BP: 127/64 mmHg (10/07 2330) Pulse Rate: 102 (10/07 2330) Intake/Output from previous day:   Intake/Output from this shift:    Labs:  Recent Labs  10/20/13 1847  WBC 4.5  HGB 11.0*  PLT 172  CREATININE 1.13   The CrCl is unknown because both a height and weight (above a minimum accepted value) are required for this calculation. No results found for this basename: VANCOTROUGH, VANCOPEAK, VANCORANDOM, GENTTROUGH, GENTPEAK, GENTRANDOM, TOBRATROUGH, TOBRAPEAK, TOBRARND, AMIKACINPEAK, AMIKACINTROU, AMIKACIN,  in the last 72 hours   Microbiology: No results found for this or any previous visit (from the past 720 hour(s)).  Medical History: Past Medical History  Diagnosis Date  . Mental disorder     alzhiemers  . Hypertension   . Dementia   . BPH (benign prostatic hyperplasia)   . Syncope   . Left ventricular diastolic dysfunction, NYHA class 1   . Cerebrovascular disease     Chronic ischemic microvascular white matter disease and central atrophy on CT scan    Medications:  Prescriptions prior to admission  Medication Sig Dispense Refill  . aspirin 81 MG tablet Take 81 mg by mouth daily.      . Cholecalciferol (VITAMIN D PO) Take 1 tablet by mouth daily.      . dorzolamide-timolol (COSOPT) 22.3-6.8 MG/ML ophthalmic solution 1 drop 2 (two) times daily.      Marland Kitchen. latanoprost (XALATAN) 0.005 % ophthalmic solution Place 1 drop into both eyes at bedtime.      . Multiple Vitamins-Minerals (CENTRUM PO) Take by mouth.       Assessment: Aspiration PNA outside hospital setting. levaquin for possible legionnaires. (could use azith for atypicals)    Goal of Therapy:  Renal dose zoysn  Plan:  Zosyn 3.375gm q8h  levaquin 750 q24h   Janice CoffinEarl, Arel Tippen Jonathan 10/21/2013,2:09 AM

## 2013-10-21 NOTE — Evaluation (Signed)
Physical Therapy Evaluation Patient Details Name: Cesar Maldonado MRN: [id number] DOB: Dec 17, 1924 Today's Date: 10/21/2013   History of Present Illness  Pt is an 80 y.o. male with a past medical history that is relevant for HTN, syncope, AD, ischemic stroke, and BPH, brought in by wife after sustaining an episode of transient loss of consciousness at home. Pt states that he doesn't recall what happened and why he is in the hospital, but his wife is at the bedside and stated that they were home, patient went to the bathroom by himself after eating dinner, then she heard a thud and found him unresponsive on the floor. He vomited but did not have seizure like activity. EMS was called and found that patient was bradycardic in the 30s. Pacing was entertained but his mental status and heart rate improved. CT brain upon arrival showed findings consistent with small infarcts in the mid left thalamus and posterior right subthalamus.   Clinical Impression  Pt admitted with the above. Pt currently with functional limitations due to the deficits listed below (see PT Problem List). At the time of PT eval pt declining use of RW, however very unsteady with standing activity. Anticipate pt will progress well and recommended use of RW for safety. Pt will benefit from skilled PT to increase their independence and safety with mobility to allow discharge to the venue listed below.       Follow Up Recommendations Home health PT;Supervision/Assistance - 24 hour    Equipment Recommendations  Rolling walker with 5" wheels    Recommendations for Other Services       Precautions / Restrictions Precautions Precautions: Fall Restrictions Weight Bearing Restrictions: No      Mobility  Bed Mobility Overal bed mobility: Needs Assistance Bed Mobility: Supine to Sit     Supine to sit: Min guard     General bed mobility comments: Pt able to transition to EOB with increased time and use of bed rails for assist.    Transfers Overall transfer level: Needs assistance Equipment used: 1 person hand held assist Transfers: Sit to/from [employer] Sit to Stand: Mod assist Stand pivot transfers: Min assist       General transfer comment: Pt required assist to steady himself sit>stand. Pt refusing the RW for support. During SPT to recliner chair pt required min assist for support and general safety awareness.   Ambulation/Gait             General Gait Details: Deferred as pt was going in and out of V-tach while sitting EOB.   Stairs            Wheelchair Mobility    Modified Rankin (Stroke Patients Only)       Balance Overall balance assessment: History of Falls;Needs assistance Sitting-balance support: Feet supported;No upper extremity supported Sitting balance-Leahy Scale: Fair     Standing balance support: No upper extremity supported;During functional activity Standing balance-Leahy Scale: Poor Standing balance comment: Requires assist                             Pertinent Vitals/Pain Pain Assessment: No/denies pain    Home Living Family/patient expects to be discharged to:: Private residence Living Arrangements: Spouse/significant other Available Help at Discharge: Family Type of Home: House Home Access: Stairs to enter   Secretary/administrator of Steps: 2 Home Layout: One level Home Equipment: None      Prior Function Level of Independence: Independent  Hand Dominance   Dominant Hand: Right    Extremity/Trunk Assessment   Upper Extremity Assessment: Defer to OT evaluation;Overall WFL for tasks assessed           Lower Extremity Assessment: Overall WFL for tasks assessed      Cervical / Trunk Assessment: Normal  Communication   Communication: No difficulties  Cognition Arousal/Alertness: Awake/alert Behavior During Therapy: Flat affect Overall Cognitive Status: Within Functional Limits for tasks  assessed                      General Comments      Exercises        Assessment/Plan    PT Assessment Patient needs continued PT services  PT Diagnosis Difficulty walking;Abnormality of gait   PT Problem List Decreased strength;Decreased range of motion;Decreased activity tolerance;Decreased balance;Decreased mobility;Decreased knowledge of use of DME;Decreased safety awareness;Decreased knowledge of precautions  PT Treatment Interventions DME instruction;Gait training;Stair training;Functional mobility training;Therapeutic activities;Therapeutic exercise;Neuromuscular re-education;Patient/family education   PT Goals (Current goals can be found in the Care Plan section) Acute Rehab PT Goals Patient Stated Goal: To return home with wife PT Goal Formulation: With patient/family Time For Goal Achievement: 10/28/13 Potential to Achieve Goals: Good    Frequency Min 3X/week   Barriers to discharge        Co-evaluation               End of Session Equipment Utilized During Treatment: Gait belt Activity Tolerance: Patient tolerated treatment well Patient left: in chair;with call bell/phone within reach;with family/visitor present Nurse Communication: Mobility status         Time: [id number] PT Time Calculation (min): 25 min   Charges:   PT Evaluation $Initial PT Evaluation Tier I: 1 Procedure PT Treatments $Gait Training: 8-22 mins $Therapeutic Activity: 8-22 mins   PT G Codes:          Cesar Maldonado, Cesar Maldonado 10/21/2013, 3:59 PM  Cesar Maldonado, PT, DPT Acute Rehabilitation Services Pager: [phone]

## 2013-10-21 NOTE — Progress Notes (Signed)
Cesar Maldonado - Stepdown / ICU Progress Note  Cesar CanaryWillie E Maldonado ZOX:[id number] DOB: [birth year] DOA: 10/20/2013 PCP: Laurena SlimmerLARK,[name], MD   Brief narrative: 80 year old BM PMHx  dementia, hypertension, mild diastolic dysfunction, previous syncopal episode. The patient apparently ambulated independently to the bathroom and his wife were thought and found him on the bathroom floor. He was unresponsive and had vomited nonbloody emesis. Is not noted to have any seizure activity upon her arrival. He had not bitten his tongue. No loss of bowel or bladder continence. EMS was called to the scene and noted the patient be bradycardic upon their arrival. Subsequently the patient's mentation began to improve concurrently as his heart rate improved.  In the ER the patient became more alert. The wife reported to the ER physician that the patient had been coughing for several days but had not had any reported fevers chills or chest pain and did not endorse shortness of breath. Patient had a syncopal workup in 2014 it was negative. CT this admission revealed small infarcts in the mid left thalamus and posterior right septum was which were age determinant. CT of the chest revealed multilobar infiltrates. While in the ER patient was febrile with an oral temperature of 102.8. He was initially hypertensive. He did not have leukocytosis and his renal function was stable. He did require oxygen with improvement in saturations after application.  HPI/Subjective: Patient alert and awake. Denies current shortness of breath but endorsing some cough nonproductive. Admits to regular alcohol use but denied use prior to admission. Denied use of alcohol containing cough medication such as NyQuil.  Assessment/Plan:   Syncope and collapse/  Abnormal head CT CT head abnormal-neurology consulted and felt that the findings on CT or an incidental finding and unrelated to the patient's clinical presentation-in addition given his advanced  age with underlying dementia the attending neurologist felt that additional stroke workup likely not indicated and only suggested MRI of the brain which was subsequently negative for acute ischemic changes-given findings of pneumonia and bradycardia at presentation it is highly likely that patient's syncopal episode was precipitated by bradycardia which was possibly precipitated by hypotension and/or hypoxemia or both    Dehydration Multifactorial related to ongoing dementia, seeming ongoing alcohol use noting blood alcohol level 135 at presentation an upper respiratory infection-continue gentle IV fluid hydration at 75 cc per hour since otherwise hemodynamically stable-lactic acid on 10/8 elevated at 2.6    Acute respiratory failure with hypoxia/CAP  Non-oxygen at home-multilobar pneumonia seen on CT scan of the chest-anticipate with hydration respiratory symptoms may worsen-have adjusted antibiotics to be more appropriate to community acquired pneumonia-QTC borderline prolonged at 453 ms so opted to DC Levaquin in favor of Rocephin and Zithromax-continue supportive care-influenza panel negative-followup chest x-ray in a.m. after      Bradycardia Was not on AV nodal blocking agents prior to admission except for timolol eyedrops-rapidly resolved with treatment of hypoxemia and improvement in mentation-suspect given underlying pneumonia patient likely became hypoxemic and hypotensive which led to patient's bradycardia and subsequent syncopal episode  Elevated alcohol level Admitting alcohol level 135 but patient denied to me had been using alcohol prior to admission-repeat level less than 11-institute med surg CIWA protocol prevent withdrawal    CKD (chronic kidney disease), stage III Renal function stable- Continue IV fluid-follow urine output carefully    Hypertension/Left ventricular diastolic dysfunction, NYHA class Maldonado Blood pressure actually soft therefore well controlled-holding  antihypertensive agents-no evidence of decompensated diastolic heart failure  Dementia PT/OT evaluation this admission    BPH (benign prostatic hyperplasia)    DVT prophylaxis: Subcutaneous heparin Code Status: Full Family Communication: No family at bedside Disposition Plan/Expected LOS: Remain in step down   Consultants: Neurology-signed off 10/  Procedures: 2-D echocardiogram pending  Cultures: Blood cultures x2 pending Urine culture needs to be obtained  Antibiotics: Rocephin 10/7 >> Zithromax 10/7 >> Zosyn 10/7 >> 10/8  Objective: Blood pressure 109/62, pulse 76, temperature 98.8 F (37.Maldonado C), temperature source Oral, resp. rate 19, height 5\' 10"  (Maldonado.778 m), weight 108 lb (48.988 kg), SpO2 100.00%.  Intake/Output Summary (Last 24 hours) at 10/21/13 1402 Last data filed at 10/21/13 1000  Gross per 24 hour  Intake   1165 ml  Output      0 ml  Net   1165 ml     Exam: Gen: No acute respiratory distress Chest: Clear to auscultation anteriorly but posteriorly with scattered expiratory rhonchi in mid fields very diminished bilateral bases, 5 L oxygen and subsequently had weaned to 3 L since initial exam Cardiac: Regular rate and rhythm, S1-S2, no rubs murmurs or gallops, no peripheral edema, no JVD Abdomen: Soft nontender nondistended without obvious hepatosplenomegaly, no ascites Extremities: Symmetrical in appearance without cyanosis, clubbing or effusion  Scheduled Meds:  Scheduled Meds: . aspirin EC  81 mg Oral Daily  . azithromycin  500 mg Intravenous Q24H  . cefTRIAXone (ROCEPHIN)  IV  Maldonado g Intravenous Q24H  . cholecalciferol  400 Units Oral Daily  . dorzolamide-timolol  Maldonado drop Both Eyes BID  . heparin  5,000 Units Subcutaneous 3 times per day  . latanoprost  Maldonado drop Both Eyes QHS   Continuous Infusions:   Data Reviewed: Basic Metabolic Panel:  Recent Labs Lab 10/20/13 1847 10/21/13 1149  NA 136* 135*  K 4.2 3.6*  CL 98 100  CO2 24 23    GLUCOSE 98 96  BUN 11 14  CREATININE Maldonado.13 Maldonado.23  CALCIUM 9.Maldonado 8.5   Liver Function Tests:  Recent Labs Lab 10/20/13 1847 10/21/13 1149  AST 21 21  ALT 12 10  ALKPHOS 59 38*  BILITOT <0.2* 0.7  PROT 7.9 7.0  ALBUMIN 3.5 2.9*   No results found for this basename: LIPASE, AMYLASE,  in the last 168 hours No results found for this basename: AMMONIA,  in the last 168 hours CBC:  Recent Labs Lab 10/20/13 1847 10/21/13 1149  WBC 4.5 9.9  NEUTROABS 3.Maldonado 8.8*  HGB 11.0* 11.2*  HCT 33.Maldonado* 32.9*  MCV 85.Maldonado 84.8  PLT 172 140*   Cardiac Enzymes:  Recent Labs Lab 10/20/13 1847 10/21/13 1149  TROPONINI <0.30 <0.30   BNP (last 3 results) No results found for this basename: PROBNP,  in the last 8760 hours CBG: No results found for this basename: GLUCAP,  in the last 168 hours  No results found for this or any previous visit (from the past 240 hour(s)).   Studies:  Recent x-ray studies have been reviewed in detail by the Attending Physician  Time spent :      Junious Silk, ANP [facility] Office  [phone] Pager [phone]   **If unable to reach the above provider after paging please contact the Flow Manager @ [phone]  On-Call/Text Page:      [email or web address]      password [email or web address]  If 7PM-7AM, please contact night-coverage [email or web address] Password [email or web address] 10/21/2013, 2:02 PM   LOS: Maldonado day   Examined patient been discussed assessment and plan with ANP Revonda Standard  agree with the above plan. Patient with multiple complex medical problems> 40 minutes spent in direct patient care

## 2013-10-21 NOTE — Progress Notes (Signed)
  Echocardiogram 2D Echocardiogram has been performed.  Cesar Maldonado 10/21/2013, 2:52 PM

## 2013-10-22 ENCOUNTER — Inpatient Hospital Stay (HOSPITAL_COMMUNITY): Payer: Medicare Other

## 2013-10-22 DIAGNOSIS — I5042 Chronic combined systolic (congestive) and diastolic (congestive) heart failure: Secondary | ICD-10-CM

## 2013-10-22 DIAGNOSIS — R930 Abnormal findings on diagnostic imaging of skull and head, not elsewhere classified: Secondary | ICD-10-CM

## 2013-10-22 LAB — CBC
HCT: 30.9 % — ABNORMAL LOW (ref 39.0–52.0)
Hemoglobin: 10 g/dL — ABNORMAL LOW (ref 13.0–17.0)
MCH: 27.5 pg (ref 26.0–34.0)
MCHC: 32.4 g/dL (ref 30.0–36.0)
MCV: 85.1 fL (ref 78.0–100.0)
PLATELETS: 133 10*3/uL — AB (ref 150–400)
RBC: 3.63 MIL/uL — AB (ref 4.22–5.81)
RDW: 14.2 % (ref 11.5–15.5)
WBC: 13.8 10*3/uL — ABNORMAL HIGH (ref 4.0–10.5)

## 2013-10-22 LAB — COMPREHENSIVE METABOLIC PANEL
ALT: 10 U/L (ref 0–53)
ANION GAP: 12 (ref 5–15)
AST: 22 U/L (ref 0–37)
Albumin: 2.6 g/dL — ABNORMAL LOW (ref 3.5–5.2)
Alkaline Phosphatase: 67 U/L (ref 39–117)
BUN: 20 mg/dL (ref 6–23)
CALCIUM: 8.5 mg/dL (ref 8.4–10.5)
CO2: 27 meq/L (ref 19–32)
Chloride: 99 mEq/L (ref 96–112)
Creatinine, Ser: 1.38 mg/dL — ABNORMAL HIGH (ref 0.50–1.35)
GFR, EST AFRICAN AMERICAN: 51 mL/min — AB (ref >90)
GFR, EST NON AFRICAN AMERICAN: 44 mL/min — AB (ref >90)
GLUCOSE: 78 mg/dL (ref 70–99)
Potassium: 3.8 mEq/L (ref 3.7–5.3)
SODIUM: 138 meq/L (ref 137–147)
Total Bilirubin: 0.3 mg/dL (ref 0.3–1.2)
Total Protein: 6.7 g/dL (ref 6.0–8.3)

## 2013-10-22 LAB — LEGIONELLA ANTIGEN, URINE

## 2013-10-22 LAB — URINE CULTURE
COLONY COUNT: NO GROWTH
CULTURE: NO GROWTH

## 2013-10-22 MED ORDER — METHYLPREDNISOLONE SODIUM SUCC 125 MG IJ SOLR
60.0000 mg | Freq: Two times a day (BID) | INTRAMUSCULAR | Status: DC
  Administered 2013-10-22 – 2013-10-23 (×3): 60 mg via INTRAVENOUS
  Filled 2013-10-22 (×3): qty 0.96
  Filled 2013-10-22: qty 2

## 2013-10-22 MED ORDER — ENSURE COMPLETE PO LIQD
237.0000 mL | Freq: Three times a day (TID) | ORAL | Status: DC
  Administered 2013-10-22 – 2013-10-25 (×7): 237 mL via ORAL

## 2013-10-22 MED ORDER — IPRATROPIUM-ALBUTEROL 0.5-2.5 (3) MG/3ML IN SOLN
3.0000 mL | Freq: Four times a day (QID) | RESPIRATORY_TRACT | Status: DC
  Filled 2013-10-22 (×2): qty 3

## 2013-10-22 MED ORDER — DM-GUAIFENESIN ER 30-600 MG PO TB12
1.0000 | ORAL_TABLET | Freq: Two times a day (BID) | ORAL | Status: DC
  Administered 2013-10-22 – 2013-10-25 (×7): 1 via ORAL
  Filled 2013-10-22 (×8): qty 1

## 2013-10-22 NOTE — Progress Notes (Signed)
Orthostatic VS taken for patient.   Pt layed flat for 10 minutes in bed.  Laying BP 106/50 HR 64.  Sitting BP 119/60 HR 88.  Standing BP 146/75 HR 88.  Standing 3 minute later BP 140/69 HR 91.

## 2013-10-22 NOTE — Evaluation (Signed)
Occupational Therapy Evaluation and Discharge Patient Details Name: Cesar Maldonado MRN: [id number] DOB: Oct 05, 1924 Today's Date: 10/22/2013    History of Present Illness Pt is an 80 y.o. male with a past medical history that is relevant for HTN, syncope, AD, ischemic stroke, and BPH, brought in by wife after sustaining an episode of transient loss of consciousness at home. Pt states that he doesn't recall what happened and why he is in the hospital, but his wife is at the bedside and stated that they were home, patient went to the bathroom by himself after eating dinner, then she heard a thud and found him unresponsive on the floor. He vomited but did not have seizure like activity. EMS was called and found that patient was bradycardic in the 30s. Pacing was entertained but his mental status and heart rate improved. CT brain upon arrival showed findings consistent with small infarcts in the mid left thalamus and posterior right subthalamus.    Clinical Impression   This 80 yo male admitted with above presents to acute OT at a S level due to h/o syncopal episodes and he lives with his wife so he has S. Did note a couple of times he "skirted" items on the left as we were leaving his room, but he did not do this on returning to his room and making his way around the computer terminal in the middle of the floor or walking in and out of his bathroom. Had difficulty getting an accurate reading of O2 sats on RA (a couple of times on RA and on 3 liters of O2 his numbers showed him dropping into the 40s and 50s--but he was asymptomatic). No further OT needs identified, we will sign off.    Follow Up Recommendations  No OT follow up    Equipment Recommendations  None recommended by OT       Precautions / Restrictions Precautions Precautions: Fall Restrictions Weight Bearing Restrictions: No      Mobility Bed Mobility               General bed mobility comments: Pt up in recliner upon  arrival  Transfers Overall transfer level: Needs assistance Equipment used: None Transfers: Sit to/from Stand Sit to Stand: Supervision              Balance Overall balance assessment: History of Falls (due to syncopal episodes)                                          ADL                                         General ADL Comments: Overall at a S level due to h/o syncopal episodes     Vision  history of right vision deficits pta                          Pertinent Vitals/Pain Pain Assessment: No/denies pain     Hand Dominance Right   Extremity/Trunk Assessment Upper Extremity Assessment Upper Extremity Assessment: Overall WFL for tasks assessed           Communication Communication Communication: No difficulties   Cognition Arousal/Alertness: Awake/alert Behavior During Therapy: WFL for tasks assessed/performed Overall Cognitive Status: Within  Functional Limits for tasks assessed                                Home Living Family/patient expects to be discharged to:: Private residence Living Arrangements: Spouse/significant other Available Help at Discharge: Family Type of Home: House Home Access: Stairs to enter Secretary/administratorntrance Stairs-Number of Steps: 2 Entrance Stairs-Rails: Right Home Layout: One level     Bathroom Shower/Tub: Tub/shower unit Shower/tub characteristics: Engineer, building servicesCurtain Bathroom Toilet: Standard     Home Equipment: Shower seat;Grab bars - tub/shower          Prior Functioning/Environment Level of Independence: Independent        Comments: Pt does not drive    OT Diagnosis: Generalized weakness         OT Goals(Current goals can be found in the care plan section) Acute Rehab OT Goals Patient Stated Goal: To return home with wife Potential to Achieve Goals: Good  OT Frequency:                End of Session    Activity Tolerance: Patient tolerated treatment  well Patient left: in chair;with call bell/phone within reach (nurse tech in room getting vitals)   Time: [id number] OT Time Calculation (min): 27 min Charges:  OT General Charges $OT Visit: 1 Procedure OT Evaluation $Initial OT Evaluation Tier I: 1 Procedure OT Treatments $Self Care/Home Management : 8-22 mins  Evette GeorgesLeonard, Phyllistine Domingos Eva [phone] 10/22/2013, 12:18 PM

## 2013-10-22 NOTE — Progress Notes (Signed)
Cesar Maldonado  [name] ZOX:[id number] DOB: 07-18-1924 DOA: 10/20/2013 PCP: Laurena SlimmerLARK,[name], MD   Brief narrative: 80 year old BM PMHx  dementia, hypertension, mild diastolic dysfunction, previous syncopal episode. The patient apparently ambulated independently to the bathroom and his wife were thought and found him on the bathroom floor. He was unresponsive and had vomited nonbloody emesis. Is not noted to have any seizure activity upon her arrival. He had not bitten his tongue. No loss of bowel or bladder continence. EMS was called to the scene and noted the patient be bradycardic upon their arrival. Subsequently the patient's mentation began to improve concurrently as his heart rate improved.  In the ER the patient became more alert. The wife reported to the ER physician that the patient had been coughing for several days but had not had any reported fevers chills or chest pain and did not endorse shortness of breath. Patient had a syncopal workup in 2014 it was negative. CT this admission revealed small infarcts in the mid left thalamus and posterior right septum was which were age determinant. CT of the chest revealed multilobar infiltrates. While in the ER patient was febrile with an oral temperature of 102.8. He was initially hypertensive. He did not have leukocytosis and his renal function was stable. He did require oxygen with improvement in saturations after application.  HPI/Subjective: Up in chair. Denies cough or shortness of breath at rest  Assessment/Plan:  Syncope and collapse/  Abnormal head CT (old CVA) CT head abnormal-neurology consulted and felt that the abnormal CT  represent an incidental finding and was unrelated to the patient's clinical presentation-in addition given his advanced age with underlying dementia the attending neurologist felt that additional stroke workup likely not indicated and only suggested MRI of the brain which  was subsequently negative for acute ischemic changes-given findings of pneumonia and bradycardia at presentation it is highly likely that patient's syncopal episode was precipitated by bradycardia which was possibly precipitated by hypotension and/or hypoxemia or both-ck orthostatic VS -Wife aware of previous CVA. Patient does not recall secondary to his dementia  Chronic systolic and diastolic CHF -See echocardiogram results below -Continue aspirin 81 mg daily -Given patient's continued soft BP for patient of his age would not tolerate any CHF agents.  Hypertension -Blood pressure actually soft therefore well controlled-was NOT on antihypertensive agents prior to admission  Bradycardia Was not on AV nodal blocking agents prior to admission (except for timolol eyedrops)-bradycardia rapidly resolved with treatment of hypoxemia and improvement in mentation-suspect given underlying pneumonia patient likely became hypoxemic and hypotensive which led to patient's bradycardia and subsequent syncopal episode  Dehydration Multifactorial related to ongoing dementia, ? ongoing alcohol use  -continue gentle IV fluid hydration at 75 cc per hour since otherwise hemodynamically stable-lactic acid on 10/8 elevated at 2.6  Acute respiratory failure with hypoxia/CAP  Not on oxygen at home-multilobar pneumonia seen on CT scan of the chest-anticipate with hydration respiratory symptoms may worsen-have adjusted antibiotics to be more appropriate to community acquired pneumonia-QTC borderline prolonged at 453 ms so opted to DC Levaquin in favor of Rocephin and Zithromax-continue supportive care -influenza panel negative -followup chest x-ray 10/9 after hydration with persistent mildly increased interstitial markings bilaterally consistent with pneumonia;  -Solu-Medrol 60 mg BID -Mucinex BID -Flutter valve q 4hr while awake -DuoNeb QID      Elevated alcohol level Admitting alcohol level 135 but patient denied  to me had been using alcohol prior to  admission-repeat level less than 11 so suspect the initial result was spurious -for completeness continue med surg CIWA protocol     CKD (chronic kidney disease), stage III Renal function stable- Continue IV fluid-follow urine output carefully     Dementia PT/OT evaluation this admission: Recommendation for home health PT and 24-hour supervision, rolling walker with 5 inch wheels-OT evaluation still pending as of 10/9    BPH (benign prostatic hyperplasia)    DVT prophylaxis: Subcutaneous heparin Code Status: Full Family Communication: No family at bedside Disposition Plan/Expected LOS: Remain in step down    Consultants: Dr. Delia Heady Neurology-signed off     Procedures: 10/8 echocardiogram;- LVEF= 45% to 50%. Diffuse hypokinesis. - (grade 1 diastolic dysfunction).  10/8 MR brain without contrast;No acute infarct, hemorrhage, intracranial mass lesion, or  extra-axial fluid. -Scattered lacunar infarcts are all old.  - pathologic compression deformity, unchanged from 2007   Cultures: Blood cultures x2 pending Urine culture needs to be obtained  Antibiotics: Rocephin 10/7 >> Zithromax 10/7 >> Zosyn 10/7 >> stopped 10/8  Objective: Blood pressure 141/77, pulse 84, temperature 97.4 F (36.3 C), temperature source Oral, resp. rate 22, height 5\' 10"  (1.778 m), weight 108 lb (48.988 kg), SpO2 98.00%.  Intake/Output Summary (Last 24 hours) at 10/22/13 1437 Last data filed at 10/22/13 0500  Gross per 24 hour  Intake    300 ml  Output    500 ml  Net   -200 ml     Exam: Gen: No acute respiratory distress Chest: Clear to auscultation anteriorly but posteriorly with a few scattered expiratory rhonchi, diminished bilateral bases, 3 L oxygen  Cardiac: Regular rate and rhythm, S1-S2, no rubs murmurs or gallops, no peripheral edema, no JVD Abdomen: Soft nontender nondistended without obvious hepatosplenomegaly, no  ascites Extremities: Symmetrical in appearance without cyanosis, clubbing or effusion  Scheduled Meds:  Scheduled Meds: . aspirin EC  81 mg Oral Daily  . azithromycin  500 mg Intravenous Q24H  . cefTRIAXone (ROCEPHIN)  IV  1 g Intravenous Q24H  . cholecalciferol  400 Units Oral Daily  . dextromethorphan-guaiFENesin  1 tablet Oral BID  . dorzolamide-timolol  1 drop Both Eyes BID  . feeding supplement (ENSURE COMPLETE)  237 mL Oral TID BM  . folic acid  1 mg Oral Daily  . heparin  5,000 Units Subcutaneous 3 times per day  . latanoprost  1 drop Both Eyes QHS  . methylPREDNISolone (SOLU-MEDROL) injection  60 mg Intravenous Q12H  . multivitamin with minerals  1 tablet Oral Daily  . thiamine  100 mg Oral Daily   Or  . thiamine  100 mg Intravenous Daily   Continuous Infusions:   Data Reviewed: Basic Metabolic Panel:  Recent Labs Lab 10/20/13 1847 10/21/13 1149 10/22/13 0315  NA 136* 135* 138  K 4.2 3.6* 3.8  CL 98 100 99  CO2 24 23 27   GLUCOSE 98 96 78  BUN 11 14 20   CREATININE 1.13 1.23 1.38*  CALCIUM 9.1 8.5 8.5   Liver Function Tests:  Recent Labs Lab 10/20/13 1847 10/21/13 1149 10/22/13 0315  AST 21 21 22   ALT 12 10 10   ALKPHOS 59 38* 67  BILITOT <0.2* 0.7 0.3  PROT 7.9 7.0 6.7  ALBUMIN 3.5 2.9* 2.6*   No results found for this basename: LIPASE, AMYLASE,  in the last 168 hours No results found for this basename: AMMONIA,  in the last 168 hours CBC:  Recent Labs Lab 10/20/13 1847 10/21/13 1149 10/22/13 0315  WBC 4.5 9.9 13.8*  NEUTROABS 3.1 8.8*  --   HGB 11.0* 11.2* 10.0*  HCT 33.1* 32.9* 30.9*  MCV 85.1 84.8 85.1  PLT 172 140* 133*   Cardiac Enzymes:  Recent Labs Lab 10/20/13 1847 10/21/13 1149 10/21/13 1302  TROPONINI <0.30 <0.30 <0.30   BNP (last 3 results) No results found for this basename: PROBNP,  in the last 8760 hours CBG: No results found for this basename: GLUCAP,  in the last 168 hours  Recent Results (from the past 240  hour(s))  CULTURE, BLOOD (ROUTINE X 2)     Status: None   Collection Time    10/21/13  2:52 AM      Result Value Ref Range Status   Specimen Description BLOOD LEFT ARM   Final   Special Requests BOTTLES DRAWN AEROBIC AND ANAEROBIC 10CC EACH   Final   Culture  Setup Time     Final   Value: 10/21/2013 08:56     Performed at [facility]   Culture     Final   Value:        BLOOD CULTURE RECEIVED NO GROWTH TO DATE CULTURE WILL BE HELD FOR 5 DAYS BEFORE ISSUING A FINAL NEGATIVE REPORT     Performed at [facility]   Report Status PENDING   Incomplete  CULTURE, BLOOD (ROUTINE X 2)     Status: None   Collection Time    10/21/13  2:59 AM      Result Value Ref Range Status   Specimen Description BLOOD LEFT FOREARM   Final   Special Requests     Final   Value: BOTTLES DRAWN AEROBIC AND ANAEROBIC 10CC AEROBIC 5CC ANAEROBIC   Culture  Setup Time     Final   Value: 10/21/2013 08:56     Performed at [facility]   Culture     Final   Value:        BLOOD CULTURE RECEIVED NO GROWTH TO DATE CULTURE WILL BE HELD FOR 5 DAYS BEFORE ISSUING A FINAL NEGATIVE REPORT     Performed at [facility]   Report Status PENDING   Incomplete     Studies:  Recent x-ray studies have been reviewed in detail by the Attending Physician  Time spent :      Junious Silkllison Ellis, ANP [facility] Office  [phone] Pager [phone]   **If unable to reach the above provider after paging please contact the Flow Manager @ [phone]  On-Call/Text Page:      [email or web address]      password [email or web address]  If 7PM-7AM, please contact night-coverage [email or web address] Password [email or web address] 10/22/2013, 2:37 PM   LOS: 2 days  Examined patient and discussed assessment and plan with ANP Revonda StandardAllison and agree with her plan. Discuss plan of care with wife, and answered all questions. Patient with multiple complex medical problems> 40 minutes spent in direct patient care

## 2013-10-22 NOTE — Progress Notes (Signed)
Report received from GaylordAngela, [place] for patient transfer to [phone]

## 2013-10-22 NOTE — Progress Notes (Signed)
NURSING PROGRESS NOTE  Cesar Maldonado [id number] Transfer Data: 10/22/2013 2:55 PM Attending Provider: Drema Dallasurtis J Woods, MD JYN:WGNFA,OZHYQMVPCP:CLARK,[name], MD Code Status: Full  Cesar Maldonado is a 80 y.o. male patient transferred from 2H -No acute distress noted.  -No complaints of shortness of breath.  -No complaints of chest pain.     Blood pressure 114/55, pulse 85, temperature 99.7 F (37.6 C), temperature source Oral, resp. rate 25, height 5\' 10"  (1.778 m), weight 48.988 kg (108 lb), SpO2 95.00%.   IV Fluids:  IV in place, occlusive dsg intact without redness, IV cath  Allergies:  Review of patient's allergies indicates no known allergies.  Past Medical History:   has a past medical history of Mental disorder; Hypertension; Dementia; BPH (benign prostatic hyperplasia); Syncope; Left ventricular diastolic dysfunction, NYHA class 1; and Cerebrovascular disease.  Past Surgical History:   has past surgical history that includes Breast surgery and Pars plana vitrectomy (12/19/2010).  Social History:   reports that he has never smoked. He does not have any smokeless tobacco history on file. He reports that he drinks about 1.2 ounces of alcohol per week. He reports that he does not use illicit drugs.  Skin: sacrum, blanchable   Patient/Family orientated to room. Information packet given to patient/family. Admission inpatient armband information verified with patient/family to include name and date of birth and placed on patient arm. Side rails up x 2, fall assessment and education completed with patient/family. Patient/family able to verbalize understanding of risk associated with falls and verbalized understanding to call for assistance before getting out of bed. Call light within reach. Patient/family able to voice and demonstrate understanding of unit orientation instructions.    Will continue to evaluate and treat per MD orders.

## 2013-10-22 NOTE — Progress Notes (Signed)
INITIAL NUTRITION ASSESSMENT  DOCUMENTATION CODES Per approved criteria  -Severe malnutrition in the context of chronic illness -Underweight  Pt meets criteria for severe MALNUTRITION in the context of chronic illness as evidenced by severe fat and muscle wasting.  INTERVENTION: - Ensure Complete po TID, each supplement provides 350 kcal and 13 grams of protein - RD will continue to monitor.   NUTRITION DIAGNOSIS: Inadequate oral intake related to syncope and Alzheimer's Disease as evidenced by poor po and wt loss.   Goal: Pt to meet >/= 90% of their estimated nutrition needs   Monitor:  Weight trend, po intake, acceptance of supplements, labs  Reason for Assessment: MST  80 y.o. male  Admitting Dx: Syncope and collapse  ASSESSMENT: 80 y.o. male with past medical history of CVS, dementia, hypertension, mild diastolic congestive heart failure, syncope, who presents with syncope episode.  - Pt reports recent weight loss of unknown amount. Meal completion is recorded as 25%. Pt said that he may go home today. He was encouraged to continue nutritional supplements at home.  Nutrition Focused Physical Exam:  Subcutaneous Fat:  Orbital Region: moderate wasting Upper Arm Region: moderate to severe wasting Thoracic and Lumbar Region: moderate to severe wasting  Muscle:  Temple Region: moderate wasting Clavicle Bone Region: severe wasting Clavicle and Acromion Bone Region: severe wasting Scapular Bone Region: n/a Dorsal Hand: moderate wasting Patellar Region: severe wasting Anterior Thigh Region: moderate to severe wasting Posterior Calf Region: moderate to severe wasting  Edema: none  Height: Ht Readings from Last 1 Encounters:  10/20/13 5\' 10"  (1.778 m)    Weight: Wt Readings from Last 1 Encounters:  10/20/13 108 lb (48.988 kg)    Ideal Body Weight: 73 kg  % Ideal Body Weight: 67%  Wt Readings from Last 10 Encounters:  10/20/13 108 lb (48.988 kg)  02/26/12  82 lb 14.4 oz (37.603 kg)  12/11/10 112 lb 3.4 oz (50.9 kg)    Usual Body Weight: unknown  % Usual Body Weight: n/a  BMI:  Body mass index is 15.5 kg/(m^2).  Estimated Nutritional Needs: Kcal: 1450-1700 Protein: 85-95 g Fluid: >1.7 L/day  Skin: intact  Diet Order: Dysphagia  EDUCATION NEEDS: -Education needs addressed   Intake/Output Summary (Last 24 hours) at 10/22/13 1057 Last data filed at 10/22/13 0500  Gross per 24 hour  Intake    400 ml  Output    700 ml  Net   -300 ml    Last BM: prior to admission   Labs:   Recent Labs Lab 10/20/13 1847 10/21/13 1149 10/22/13 0315  NA 136* 135* 138  K 4.2 3.6* 3.8  CL 98 100 99  CO2 24 23 27   BUN 11 14 20   CREATININE 1.13 1.23 1.38*  CALCIUM 9.1 8.5 8.5  GLUCOSE 98 96 78    CBG (last 3)  No results found for this basename: GLUCAP,  in the last 72 hours  Scheduled Meds: . aspirin EC  81 mg Oral Daily  . azithromycin  500 mg Intravenous Q24H  . cefTRIAXone (ROCEPHIN)  IV  1 g Intravenous Q24H  . cholecalciferol  400 Units Oral Daily  . dorzolamide-timolol  1 drop Both Eyes BID  . folic acid  1 mg Oral Daily  . heparin  5,000 Units Subcutaneous 3 times per day  . latanoprost  1 drop Both Eyes QHS  . multivitamin with minerals  1 tablet Oral Daily  . thiamine  100 mg Oral Daily   Or  .  thiamine  100 mg Intravenous Daily    Continuous Infusions:   Past Medical History  Diagnosis Date  . Mental disorder     alzhiemers  . Hypertension   . Dementia   . BPH (benign prostatic hyperplasia)   . Syncope   . Left ventricular diastolic dysfunction, NYHA class 1   . Cerebrovascular disease     Chronic ischemic microvascular white matter disease and central atrophy on CT scan    Past Surgical History  Procedure Laterality Date  . Breast surgery      left tumor  . Pars plana vitrectomy  12/19/2010    Procedure: PARS PLANA VITRECTOMY WITH INTRAOCULAR LENS;  Surgeon: Shade FloodGreer Geiger, MD;  [facility];   Service: Ophthalmology;  Laterality: Right;  PARS PLANA VITRECTOMY WITH INTRAOCULAR LENS [900]membrane peel,endolaser right eye    Ebbie LatusHaley Hawkins RD, LDN

## 2013-10-22 NOTE — Progress Notes (Signed)
Physical Therapy Treatment Patient Details Name: Cesar CanaryWillie E Cho MRN: [id number] DOB: 10-07-1924 Today's Date: 10/22/2013    History of Present Illness Pt is an 80 y.o. male with a past medical history that is relevant for HTN, syncope, AD, ischemic stroke, and BPH, brought in by wife after sustaining an episode of transient loss of consciousness at home. Pt states that he doesn't recall what happened and why he is in the hospital, but his wife is at the bedside and stated that they were home, patient went to the bathroom by himself after eating dinner, then she heard a thud and found him unresponsive on the floor. He vomited but did not have seizure like activity. EMS was called and found that patient was bradycardic in the 30s. Pacing was entertained but his mental status and heart rate improved. CT brain upon arrival showed findings consistent with small infarcts in the mid left thalamus and posterior right subthalamus.     PT Comments    Pt making steady progress with mobility. VSS with session: alarm for SaO2 kept going off with in poor waveform/inaccurate readings. Did better when changed to forehead vs finger pulse ox. HR stable with lots of artifact on monitor during gait.   Follow Up Recommendations  Home health PT;Supervision/Assistance - 24 hour     Equipment Recommendations  Rolling walker with 5" wheels       Precautions / Restrictions Precautions Precautions: Fall Restrictions Weight Bearing Restrictions: No    Mobility  Bed Mobility               General bed mobility comments: not assessed- pt up in recliner before and after session  Transfers Overall transfer level: Needs assistance Equipment used: None Transfers: Sit to/from Stand Sit to Stand: Min guard;Supervision         General transfer comment: performed x2 reps for instruction and strengthening. cues on hand placement and for ant weight shift to stand and then for upright posture with  standing.  Ambulation/Gait Ambulation/Gait assistance: Min guard;Min assist Ambulation Distance (Feet): 40 Feet Assistive device: None Gait Pattern/deviations: Step-through pattern;Decreased stride length;Trunk flexed;Narrow base of support Gait velocity: decreased Gait velocity interpretation: Below normal speed for age/gender General Gait Details: in room only due to multiple lines/monitor. cues on posture and step length. no LOB noted   Stairs            Wheelchair Mobility    Modified Rankin (Stroke Patients Only)          Cognition Arousal/Alertness: Awake/alert Behavior During Therapy: WFL for tasks assessed/performed Overall Cognitive Status: Within Functional Limits for tasks assessed                      Exercises General Exercises - Lower Extremity Long Arc Quad: AROM;Strengthening;Both;10 reps;Seated Hip Flexion/Marching: AROM;Strengthening;Both;10 reps;Seated;Standing (x10 reps each position) Toe Raises: AROM;Strengthening;Seated;Standing;10 reps (x10 reps each position) Heel Raises: AROM;Strengthening;10 reps;Standing;Seated (x10 reps each position) Mini-Sqauts: AROM;Strengthening;10 reps;Standing        Pertinent Vitals/Pain Pain Assessment: No/denies pain    Home Living Family/patient expects to be discharged to:: Private residence Living Arrangements: Spouse/significant other Available Help at Discharge: Family Type of Home: House Home Access: Stairs to enter Entrance Stairs-Rails: Right Home Layout: One level Home Equipment: Shower seat;Grab bars - tub/shower      Prior Function Level of Independence: Independent      Comments: Pt does not drive   PT Goals (current goals can now be found in the care  plan section) Acute Rehab PT Goals Patient Stated Goal: To return home with wife PT Goal Formulation: With patient/family Time For Goal Achievement: 10/28/13 Potential to Achieve Goals: Good Progress towards PT goals: Progressing  toward goals    Frequency  Min 3X/week    PT Plan Current plan remains appropriate       End of Session Equipment Utilized During Treatment: Gait belt Activity Tolerance: Patient tolerated treatment well Patient left: in chair;with call bell/phone within reach;with family/visitor present     Time: 1210-1236 PT Time Calculation (min): 26 min  Charges:  $Gait Training: 8-22 mins $Therapeutic Exercise: 8-22 mins                    G Codes:      Sallyanne KusterBury, Kathy 10/22/2013, 1:04 PM

## 2013-10-22 NOTE — Progress Notes (Signed)
Patient's Sp02 99% on room air at rest. Patient's Sp02 97% on room air while ambulating in the hall.  Patient was a stand by assist using walker. Patient had no complaints of shortness of breath or distress, and tolerated walk well.

## 2013-10-23 DIAGNOSIS — E43 Unspecified severe protein-calorie malnutrition: Secondary | ICD-10-CM | POA: Insufficient documentation

## 2013-10-23 DIAGNOSIS — J9601 Acute respiratory failure with hypoxia: Secondary | ICD-10-CM

## 2013-10-23 DIAGNOSIS — F0390 Unspecified dementia without behavioral disturbance: Secondary | ICD-10-CM

## 2013-10-23 DIAGNOSIS — N183 Chronic kidney disease, stage 3 (moderate): Secondary | ICD-10-CM

## 2013-10-23 DIAGNOSIS — R55 Syncope and collapse: Secondary | ICD-10-CM

## 2013-10-23 DIAGNOSIS — J189 Pneumonia, unspecified organism: Principal | ICD-10-CM

## 2013-10-23 LAB — BASIC METABOLIC PANEL
Anion gap: 11 (ref 5–15)
BUN: 26 mg/dL — AB (ref 6–23)
CO2: 28 meq/L (ref 19–32)
CREATININE: 1.14 mg/dL (ref 0.50–1.35)
Calcium: 9.2 mg/dL (ref 8.4–10.5)
Chloride: 100 mEq/L (ref 96–112)
GFR calc Af Amer: 65 mL/min — ABNORMAL LOW (ref >90)
GFR calc non Af Amer: 56 mL/min — ABNORMAL LOW (ref >90)
GLUCOSE: 123 mg/dL — AB (ref 70–99)
POTASSIUM: 4.4 meq/L (ref 3.7–5.3)
Sodium: 139 mEq/L (ref 137–147)

## 2013-10-23 LAB — CBC
HEMATOCRIT: 31.1 % — AB (ref 39.0–52.0)
HEMOGLOBIN: 10.4 g/dL — AB (ref 13.0–17.0)
MCH: 28.3 pg (ref 26.0–34.0)
MCHC: 33.4 g/dL (ref 30.0–36.0)
MCV: 84.7 fL (ref 78.0–100.0)
Platelets: 149 10*3/uL — ABNORMAL LOW (ref 150–400)
RBC: 3.67 MIL/uL — ABNORMAL LOW (ref 4.22–5.81)
RDW: 14 % (ref 11.5–15.5)
WBC: 15.3 10*3/uL — AB (ref 4.0–10.5)

## 2013-10-23 MED ORDER — METHYLPREDNISOLONE SODIUM SUCC 40 MG IJ SOLR
40.0000 mg | Freq: Two times a day (BID) | INTRAMUSCULAR | Status: DC
  Administered 2013-10-23 – 2013-10-24 (×2): 40 mg via INTRAVENOUS
  Filled 2013-10-23 (×3): qty 1

## 2013-10-23 MED ORDER — IPRATROPIUM-ALBUTEROL 0.5-2.5 (3) MG/3ML IN SOLN: 3.0000 mL | RESPIRATORY_TRACT | Status: DC | PRN

## 2013-10-23 NOTE — Progress Notes (Signed)
PATIENT DETAILS Name: Cesar Maldonado Age: 80 y.o. Sex: male Date of Birth: 10/16/1924 Admit Date: 10/20/2013 Admitting Physician Lorretta HarpXilin Niu, MD ZOX:[id number]:CLARK,[name], MD  Subjective: No major issues overnight.  Assessment/Plan: Principal Problem:   Syncope and collapse -Secondary to bradycardia-suspect vagal response (occured post vomiting) -CT head showed suspected infarcts-but MRI Brain-neg for acute infarcts  Active Problems: Acute respiratory failure with hypoxia - Secondary to community-acquired pneumonia-? Aspiration component -Significantly improved with IV antibiotics-now on room air. -? History of COPD-Taper steroids, encourage spirometry.  Community acquired pneumonia - Doing well, afebrile -Continue with Rocephin/Zithromax -Blood cultures on 10/8 negative, influenza PCR negative as well.Urine streptococcal and Legionella antigen negative.  Bradycardia- transient - Suspect that this was secondary to vagal tone-as this occurred post vomiting. Given age, transient nature of this bradycardia- suspect no further workup required at this time. It would not change management.  Chronic combined systolic/diastolic heart failure - Clinically compensated.  -Given patient's continued soft BP for patient of his age would not tolerate any CHF agents.  CKD (chronic kidney disease), stage III -at baseline  Dementia -suspect at baseline-answering most of my questions appropriately  Protein-calorie malnutrition, severe -c/w supplements  Disposition: Remain inpatient  DVT Prophylaxis: Prophylactic Heparin  Code Status: Full code   Family Communication None  Procedures:  None  CONSULTS:  neurology  Time spent 40 minutes-which includes 50% of the time with face-to-face with patient/ family and coordinating care related to the above assessment and plan.    MEDICATIONS: Scheduled Meds: . aspirin EC  81 mg Oral Daily  . azithromycin  500 mg Intravenous  Q24H  . cefTRIAXone (ROCEPHIN)  IV  1 g Intravenous Q24H  . cholecalciferol  400 Units Oral Daily  . dextromethorphan-guaiFENesin  1 tablet Oral BID  . dorzolamide-timolol  1 drop Both Eyes BID  . feeding supplement (ENSURE COMPLETE)  237 mL Oral TID BM  . folic acid  1 mg Oral Daily  . heparin  5,000 Units Subcutaneous 3 times per day  . latanoprost  1 drop Both Eyes QHS  . methylPREDNISolone (SOLU-MEDROL) injection  60 mg Intravenous Q12H  . multivitamin with minerals  1 tablet Oral Daily  . thiamine  100 mg Oral Daily   Or  . thiamine  100 mg Intravenous Daily   Continuous Infusions:  PRN Meds:.acetaminophen, ipratropium-albuterol, LORazepam, LORazepam  Antibiotics: Anti-infectives   Start     Dose/Rate Route Frequency Ordered Stop   10/21/13 2200  cefTRIAXone (ROCEPHIN) 1 g in dextrose 5 % 50 mL IVPB     1 g 100 mL/hr over 30 Minutes Intravenous Every 24 hours 10/21/13 1022     10/21/13 2200  azithromycin (ZITHROMAX) 500 mg in dextrose 5 % 250 mL IVPB     500 mg 250 mL/hr over 60 Minutes Intravenous Every 24 hours 10/21/13 1022     10/21/13 0400  piperacillin-tazobactam (ZOSYN) IVPB 3.375 g  Status:  Discontinued     3.375 g 12.5 mL/hr over 240 Minutes Intravenous 3 times per day 10/21/13 0212 10/21/13 1026   10/21/13 0300  levofloxacin (LEVAQUIN) IVPB 750 mg  Status:  Discontinued     750 mg 100 mL/hr over 90 Minutes Intravenous Every 24 hours 10/21/13 0212 10/21/13 1022   10/20/13 2200  cefTRIAXone (ROCEPHIN) 1 g in dextrose 5 % 50 mL IVPB     1 g 100 mL/hr over 30 Minutes Intravenous  Once 10/20/13 2154 10/20/13 2238   10/20/13 2200  azithromycin (ZITHROMAX) 500 mg in dextrose 5 % 250 mL IVPB     500 mg 250 mL/hr over 60 Minutes Intravenous  Once 10/20/13 2154 10/20/13 2357       PHYSICAL EXAM: Vital signs in last 24 hours: Filed Vitals:   10/22/13 1442 10/22/13 1646 10/22/13 2056 10/23/13 0659  BP: 114/55  116/63 142/70  Pulse: 85  81 70  Temp: 99.7 F (37.6  C)  98.3 F (36.8 C) 98.3 F (36.8 C)  TempSrc: Oral  Oral Oral  Resp: 25  18 16   Height:      Weight:      SpO2: 95% 99% 98% 97%    Weight change:  Filed Weights   10/20/13 2330  Weight: 48.988 kg (108 lb)   Body mass index is 15.5 kg/(m^2).   Gen Exam: Awake and mostly alert with clear speech.   Neck: Supple, No JVD.   Chest: B/L Clear.   CVS: S1 S2 Regular, no murmurs.  Abdomen: soft, BS +, non tender, non distended. Extremities: no edema, lower extremities warm to touch. Neurologic: Non Focal.   Skin: No Rash.   Wounds: N/A.   Intake/Output from previous day:  Intake/Output Summary (Last 24 hours) at 10/23/13 1455 Last data filed at 10/23/13 0659  Gross per 24 hour  Intake    300 ml  Output    400 ml  Net   -100 ml     LAB RESULTS: CBC  Recent Labs Lab 10/20/13 1847 10/21/13 1149 10/22/13 0315 10/23/13 0336  WBC 4.5 9.9 13.8* 15.3*  HGB 11.0* 11.2* 10.0* 10.4*  HCT 33.1* 32.9* 30.9* 31.1*  PLT 172 140* 133* 149*  MCV 85.1 84.8 85.1 84.7  MCH 28.3 28.9 27.5 28.3  MCHC 33.2 34.0 32.4 33.4  RDW 14.0 14.1 14.2 14.0  LYMPHSABS 1.0 0.5*  --   --   MONOABS 0.5 0.5  --   --   EOSABS 0.0 0.1  --   --   BASOSABS 0.0 0.0  --   --     Chemistries   Recent Labs Lab 10/20/13 1847 10/21/13 1149 10/22/13 0315 10/23/13 0336  NA 136* 135* 138 139  K 4.2 3.6* 3.8 4.4  CL 98 100 99 100  CO2 24 23 27 28   GLUCOSE 98 96 78 123*  BUN 11 14 20  26*  CREATININE 1.13 1.23 1.38* 1.14  CALCIUM 9.1 8.5 8.5 9.2    CBG: No results found for this basename: GLUCAP,  in the last 168 hours  GFR Estimated Creatinine Clearance: 31.6 ml/min (by C-G formula based on Cr of 1.14).  Coagulation profile No results found for this basename: INR, PROTIME,  in the last 168 hours  Cardiac Enzymes  Recent Labs Lab 10/20/13 1847 10/21/13 1149 10/21/13 1302  TROPONINI <0.30 <0.30 <0.30    No components found with this basename: POCBNP,  No results found for this  basename: DDIMER,  in the last 72 hours No results found for this basename: HGBA1C,  in the last 72 hours No results found for this basename: CHOL, HDL, LDLCALC, TRIG, CHOLHDL, LDLDIRECT,  in the last 72 hours No results found for this basename: TSH, T4TOTAL, FREET3, T3FREE, THYROIDAB,  in the last 72 hours No results found for this basename: VITAMINB12, FOLATE, FERRITIN, TIBC, IRON, RETICCTPCT,  in the last 72 hours No results found for this basename: LIPASE, AMYLASE,  in the last 72 hours  Urine Studies No results found for this basename: UACOL, UAPR, USPG,  UPH, UTP, UGL, UKET, UBIL, UHGB, UNIT, UROB, ULEU, UEPI, UWBC, URBC, UBAC, CAST, CRYS, UCOM, BILUA,  in the last 72 hours  MICROBIOLOGY: Recent Results (from the past 240 hour(s))  CULTURE, BLOOD (ROUTINE X 2)     Status: None   Collection Time    10/21/13  2:52 AM      Result Value Ref Range Status   Specimen Description BLOOD LEFT ARM   Final   Special Requests BOTTLES DRAWN AEROBIC AND ANAEROBIC 10CC EACH   Final   Culture  Setup Time     Final   Value: 10/21/2013 08:56     Performed at [facility]   Culture     Final   Value:        BLOOD CULTURE RECEIVED NO GROWTH TO DATE CULTURE WILL BE HELD FOR 5 DAYS BEFORE ISSUING A FINAL NEGATIVE REPORT     Performed at [facility]   Report Status PENDING   Incomplete  CULTURE, BLOOD (ROUTINE X 2)     Status: None   Collection Time    10/21/13  2:59 AM      Result Value Ref Range Status   Specimen Description BLOOD LEFT FOREARM   Final   Special Requests     Final   Value: BOTTLES DRAWN AEROBIC AND ANAEROBIC 10CC AEROBIC 5CC ANAEROBIC   Culture  Setup Time     Final   Value: 10/21/2013 08:56     Performed at [facility]   Culture     Final   Value:        BLOOD CULTURE RECEIVED NO GROWTH TO DATE CULTURE WILL BE HELD FOR 5 DAYS BEFORE ISSUING A FINAL NEGATIVE REPORT     Performed at [facility]   Report Status PENDING   Incomplete  URINE CULTURE      Status: None   Collection Time    10/21/13  2:58 PM      Result Value Ref Range Status   Specimen Description URINE, RANDOM   Final   Special Requests NONE   Final   Culture  Setup Time     Final   Value: 10/21/2013 16:17     Performed at [facility] Count     Final   Value: NO GROWTH     Performed at [facility]   Culture     Final   Value: NO GROWTH     Performed at [facility]   Report Status 10/22/2013 FINAL   Final    RADIOLOGY STUDIES/RESULTS: Dg Chest 2 View  10/20/2013   CLINICAL DATA:  Syncope and altered mental status  EXAM: CHEST  2 VIEW  COMPARISON:  February 24, 2012  FINDINGS: There is underlying emphysema. There is a 2.7 x 2.7 cm nodular appearing lesion in the right upper lobe. Elsewhere lungs are clear. Heart size and pulmonary vascularity are normal. No adenopathy. There is degenerative change in the thoracic spine. There is an old healed fracture of the posterior left tenth rib.  IMPRESSION: 2.7 x 2.7 cm nodular appearing lesion right upper lobe. While this area could represent early pneumonia, its nodular appearance raises concern for potential neoplasm. Noncontrast enhanced chest CT advised to further assess.  Underlying emphysema.  These results were called by telephone at the time of interpretation on 10/20/2013 at 8:40 pm to Dr. Glynn Octave , who verbally acknowledged these results.   Electronically Signed   By: Chrissie Noa  Margarita Grizzle M.D.   On: 10/20/2013 20:41   Ct Head Wo Contrast  10/20/2013   CLINICAL DATA:  Patient found unresponsive by wife ; lethargic with altered mental status  EXAM: CT HEAD WITHOUT CONTRAST  TECHNIQUE: Contiguous axial images were obtained from the base of the skull through the vertex without intravenous contrast.  COMPARISON:  February 24, 2012  FINDINGS: There is stable moderately severe diffuse atrophy. There is no mass, hemorrhage, extra-axial fluid collection, or midline shift. There is evidence of a  prior infarct in the left lentiform nucleus, stable. There are small prior lacunar infarcts in each internal and external capsule. There is a small lacunar infarct in the mid left thalamus as well as a small infarct in the posterior right subthalamus, not seen previously and possibly recent. There is patchy small vessel disease throughout the centra semiovale bilaterally, stable.  Bony calvarium appears intact. Visualized mastoid air cells are clear. There is bilateral ethmoid sinus disease as well as mucosal thickening in each sphenoid sinus, slightly more on the left than on the right.  IMPRESSION: Small infarcts in the mid left thalamus and posterior right subthalamus not present previously. Age of these infarcts is uncertain. Other infarcts and small vessel disease appear stable compared to most recent prior study. Moderately severe diffuse atrophy is stable. No hemorrhage or mass effect. There is paranasal sinus disease as described.   Electronically Signed   By: Bretta Bang M.D.   On: 10/20/2013 19:56   Ct Chest Wo Contrast  10/20/2013   CLINICAL DATA:  Abnormal chest radiograph.  EXAM: CT CHEST WITHOUT CONTRAST  TECHNIQUE: Multidetector CT imaging of the chest was performed following the standard protocol without IV contrast.  COMPARISON:  10/20/2013; 02/24/2012  FINDINGS: Examination is degraded secondary to patient respiratory artifact.  There are ill-defined heterogeneous airspace opacities within the right upper lobe which correlate with the findings on recently performed chest radiograph. Additionally, there are ill-defined interstitial airspace opacities within the bilateral lower lobes as well as the about the left hilum. Constellation of findings are worrisome for multifocal infection.  No pleural effusion or pneumothorax. The central pulmonary airways are widely patent. No discrete pulmonary nodules given limitation of the examination.  Scattered shotty mediastinal lymph nodes are  individually not enlarged by size criteria with index precarinal lymph node measuring 0.6 cm in greatest short axis diameter (image 19, series 2). No definite mediastinal, hilar or axillary lymphadenopathy on this noncontrast examination.  Normal heart size. No pericardial effusion. Scattered atherosclerotic plaque with a normal caliber thoracic aorta. Conventional configuration of the aortic arch.  Noncontrast evaluation of the upper abdomen demonstrates an approximately 0.7 cm hypo attenuating lesion within the dome of the right lobe of the liver, too small to adequately characterize of favored to represent a hepatic cyst. There is an additional approximately 1 cm hypo attenuating lesion within in the imaged left lobe of the liver which is incompletely imaged though also favored to represent hepatic cysts.  No definite acute or aggressive osseous abnormalities. The thyroid gland appears diminutive but otherwise normal.  IMPRESSION: Findings worrisome for multi lobar infection with possible etiologies including atypical organisms. A follow-up chest radiograph in 4-6 weeks after resolution is recommended to ensure resolution. No discrete pulmonary mass.   Electronically Signed   By: Simonne Come M.D.   On: 10/20/2013 21:45   Mr Brain Wo Contrast  10/21/2013   CLINICAL DATA:  Syncopal episode with transient loss of consciousness at home. No reported seizure activity.  Elevated Alcohol level on admission. Underlying dementia.  EXAM: MRI HEAD WITHOUT CONTRAST  TECHNIQUE: Multiplanar, multiecho pulse sequences of the brain and surrounding structures were obtained without intravenous contrast.  COMPARISON:  CT head 10/20/2013.  MR head 01/10/2006.  FINDINGS: No acute infarct, hemorrhage, intracranial mass lesion, or extra-axial fluid. Specifically no subdural or epidural hematoma. Global atrophy without obstructive hydrocephalus. Moderately advanced subcortical and periventricular white matter signal abnormality,  consistent with chronic microvascular ischemic change. Scattered lacunar infarcts are all old. Flow voids are maintained. Heterogeneous upper cervical bone marrow, similar in appearance to 2007, without pathologic compression deformity, likely non worrisome. No parenchymal hemorrhage. No spinal stenosis.  RIGHT cataract extraction. Small air-fluid level RIGHT maxillary sinus.  Compared with prior CT, the appearance is unchanged. Compared with prior MR, atrophy has progressed.  IMPRESSION: Chronic changes as described. No acute intracranial abnormality. Specifically no acute stroke which might contribute to syncope. No posttraumatic sequelae are evident.   Electronically Signed   By: Davonna Belling M.D.   On: 10/21/2013 09:16   Dg Chest Port 1 View  10/22/2013   CLINICAL DATA:  Bilateral pneumonia  EXAM: PORTABLE CHEST - 1 VIEW  COMPARISON:  CT scan of the chest of October 20, 2013 and chest x-ray of the same day the  FINDINGS: The lungs remain hyperinflated. The interstitial markings are mildly increased throughout the right lung and at the left lung base. There is no pleural effusion or pneumothorax. The cardiac silhouette is normal in size. The pulmonary vascularity is not engorged. The bony thorax is unremarkable.  IMPRESSION: COPD. There are persistent mildly increased interstitial markings bilaterally consistent with pneumonia. There is no pleural effusion or pneumothorax. There is no evidence of CHF.   Electronically Signed   By: David  [place]   On: 10/22/2013 07:57    Jeoffrey Massed, MD  [facility] Pager:[phone]  If 7PM-7AM, please contact night-coverage [email or web address] Password [email or web address] 10/23/2013, 2:55 PM   LOS: 3 days

## 2013-10-24 MED ORDER — METHYLPREDNISOLONE SODIUM SUCC 40 MG IJ SOLR
40.0000 mg | INTRAMUSCULAR | Status: DC
  Administered 2013-10-25: 40 mg via INTRAVENOUS
  Filled 2013-10-24: qty 1

## 2013-10-24 NOTE — Progress Notes (Addendum)
PATIENT DETAILS Name: Cesar Maldonado Age: 80 y.o. Sex: male Date of Birth: 1924/03/18 Admit Date: 10/20/2013 Admitting Physician Lorretta Harp, MD ZOX:[id number] S, MD  Subjective: No major issues overnight-awake and very pleasant-answers most questions appropriately. Denies any complaints  Assessment/Plan: Principal Problem:   Syncope and collapse -Secondary to transient bradycardia-suspect vagal response (occured post vomiting) -CT head showed suspected infarcts-but MRI Brain-neg for acute infarcts. No recurrence since admission  Active Problems: Acute respiratory failure with hypoxia - Secondary to community-acquired pneumonia-? Aspiration component -Significantly improved with IV antibiotics-now on room air. -? History of COPD-Taper steroids to Solumedrol daily, encourage spirometry.  Community acquired pneumonia - Doing well, afebrile-leukocytosis likely from steroids (as clinically improved) -Continue with Rocephin/Zithromax-day 5 -Blood cultures on 10/8 negative, influenza PCR negative as well.Urine streptococcal and Legionella antigen negative.  Bradycardia- transient - Suspect that this was secondary to vagal tone-as this occurred post vomiting. Given age, transient nature of this bradycardia- suspect no further workup required at this time. It would not change management.  Chronic combined systolic/diastolic heart failure - Clinically compensated. EF on 10/8 Echo 45-50 percent/Grade 1 diastolic dysfucntion -Given patient's continued soft BP/Frailty/advanced age suspect would not tolerate any CHF agents.  CKD (chronic kidney disease), stage III -at baseline  Dementia -suspect at baseline-answering most of my questions appropriately  Protein-calorie malnutrition, severe -c/w supplements  Disposition: Remain inpatient-home 10/12  DVT Prophylaxis: Prophylactic Heparin  Code Status: Full code   Family Communication None  Addendum 6:25 pm-spoke with  spouse over the phone  Procedures:  None  CONSULTS:  neurology  MEDICATIONS: Scheduled Meds: . aspirin EC  81 mg Oral Daily  . azithromycin  500 mg Intravenous Q24H  . cefTRIAXone (ROCEPHIN)  IV  1 g Intravenous Q24H  . cholecalciferol  400 Units Oral Daily  . dextromethorphan-guaiFENesin  1 tablet Oral BID  . dorzolamide-timolol  1 drop Both Eyes BID  . feeding supplement (ENSURE COMPLETE)  237 mL Oral TID BM  . folic acid  1 mg Oral Daily  . heparin  5,000 Units Subcutaneous 3 times per day  . latanoprost  1 drop Both Eyes QHS  . methylPREDNISolone (SOLU-MEDROL) injection  40 mg Intravenous Q12H  . multivitamin with minerals  1 tablet Oral Daily  . thiamine  100 mg Oral Daily   Or  . thiamine  100 mg Intravenous Daily   Continuous Infusions:  PRN Meds:.acetaminophen, ipratropium-albuterol, LORazepam, LORazepam  Antibiotics: Anti-infectives   Start     Dose/Rate Route Frequency Ordered Stop   10/21/13 2200  cefTRIAXone (ROCEPHIN) 1 g in dextrose 5 % 50 mL IVPB     1 g 100 mL/hr over 30 Minutes Intravenous Every 24 hours 10/21/13 1022     10/21/13 2200  azithromycin (ZITHROMAX) 500 mg in dextrose 5 % 250 mL IVPB     500 mg 250 mL/hr over 60 Minutes Intravenous Every 24 hours 10/21/13 1022     10/21/13 0400  piperacillin-tazobactam (ZOSYN) IVPB 3.375 g  Status:  Discontinued     3.375 g 12.5 mL/hr over 240 Minutes Intravenous 3 times per day 10/21/13 0212 10/21/13 1026   10/21/13 0300  levofloxacin (LEVAQUIN) IVPB 750 mg  Status:  Discontinued     750 mg 100 mL/hr over 90 Minutes Intravenous Every 24 hours 10/21/13 0212 10/21/13 1022   10/20/13 2200  cefTRIAXone (ROCEPHIN) 1 g in dextrose 5 % 50 mL IVPB     1 g 100 mL/hr over 30  Minutes Intravenous  Once 10/20/13 2154 10/20/13 2238   10/20/13 2200  azithromycin (ZITHROMAX) 500 mg in dextrose 5 % 250 mL IVPB     500 mg 250 mL/hr over 60 Minutes Intravenous  Once 10/20/13 2154 10/20/13 2357       PHYSICAL  EXAM: Vital signs in last 24 hours: Filed Vitals:   10/23/13 1545 10/23/13 2230 10/23/13 2331 10/24/13 0608  BP: 146/75 153/84 154/76 149/76  Pulse: 77 80 72 63  Temp: 98.3 F (36.8 C) 97.8 F (36.6 C)  97.8 F (36.6 C)  TempSrc: Oral Oral  Oral  Resp: 18 18  18   Height:      Weight:      SpO2: 93% 98%  97%    Weight change:  Filed Weights   10/20/13 2330  Weight: 48.988 kg (108 lb)   Body mass index is 15.5 kg/(m^2).   Gen Exam: Awake and alert with clear speech.  Not in distress Neck: Supple, No JVD.   Chest: B/L Clear. No rales  CVS: S1 S2 Regular, no murmurs.  Abdomen: soft, BS +, non tender, non distended. Extremities: no edema, lower extremities warm to touch. Neurologic: Non Focal.   Skin: No Rash.   Wounds: N/A.   Intake/Output from previous day:  Intake/Output Summary (Last 24 hours) at 10/24/13 1101 Last data filed at 10/24/13 0610  Gross per 24 hour  Intake    240 ml  Output    750 ml  Net   -510 ml     LAB RESULTS: CBC  Recent Labs Lab 10/20/13 1847 10/21/13 1149 10/22/13 0315 10/23/13 0336  WBC 4.5 9.9 13.8* 15.3*  HGB 11.0* 11.2* 10.0* 10.4*  HCT 33.1* 32.9* 30.9* 31.1*  PLT 172 140* 133* 149*  MCV 85.1 84.8 85.1 84.7  MCH 28.3 28.9 27.5 28.3  MCHC 33.2 34.0 32.4 33.4  RDW 14.0 14.1 14.2 14.0  LYMPHSABS 1.0 0.5*  --   --   MONOABS 0.5 0.5  --   --   EOSABS 0.0 0.1  --   --   BASOSABS 0.0 0.0  --   --     Chemistries   Recent Labs Lab 10/20/13 1847 10/21/13 1149 10/22/13 0315 10/23/13 0336  NA 136* 135* 138 139  K 4.2 3.6* 3.8 4.4  CL 98 100 99 100  CO2 24 23 27 28   GLUCOSE 98 96 78 123*  BUN 11 14 20  26*  CREATININE 1.13 1.23 1.38* 1.14  CALCIUM 9.1 8.5 8.5 9.2    CBG: No results found for this basename: GLUCAP,  in the last 168 hours  GFR Estimated Creatinine Clearance: 31.6 ml/min (by C-G formula based on Cr of 1.14).  Coagulation profile No results found for this basename: INR, PROTIME,  in the last 168  hours  Cardiac Enzymes  Recent Labs Lab 10/20/13 1847 10/21/13 1149 10/21/13 1302  TROPONINI <0.30 <0.30 <0.30    No components found with this basename: POCBNP,  No results found for this basename: DDIMER,  in the last 72 hours No results found for this basename: HGBA1C,  in the last 72 hours No results found for this basename: CHOL, HDL, LDLCALC, TRIG, CHOLHDL, LDLDIRECT,  in the last 72 hours No results found for this basename: TSH, T4TOTAL, FREET3, T3FREE, THYROIDAB,  in the last 72 hours No results found for this basename: VITAMINB12, FOLATE, FERRITIN, TIBC, IRON, RETICCTPCT,  in the last 72 hours No results found for this basename: LIPASE, AMYLASE,  in the  last 72 hours  Urine Studies No results found for this basename: UACOL, UAPR, USPG, UPH, UTP, UGL, UKET, UBIL, UHGB, UNIT, UROB, ULEU, UEPI, UWBC, URBC, UBAC, CAST, CRYS, UCOM, BILUA,  in the last 72 hours  MICROBIOLOGY: Recent Results (from the past 240 hour(s))  CULTURE, BLOOD (ROUTINE X 2)     Status: None   Collection Time    10/21/13  2:52 AM      Result Value Ref Range Status   Specimen Description BLOOD LEFT ARM   Final   Special Requests BOTTLES DRAWN AEROBIC AND ANAEROBIC 10CC EACH   Final   Culture  Setup Time     Final   Value: 10/21/2013 08:56     [facility]   Culture     Final   Value:        BLOOD CULTURE RECEIVED NO GROWTH TO DATE CULTURE WILL BE HELD FOR 5 DAYS BEFORE ISSUING A FINAL NEGATIVE REPORT     Performed at [facility]   Report Status PENDING   Incomplete  CULTURE, BLOOD (ROUTINE X 2)     Status: None   Collection Time    10/21/13  2:59 AM      Result Value Ref Range Status   Specimen Description BLOOD LEFT FOREARM   Final   Special Requests     Final   Value: BOTTLES DRAWN AEROBIC AND ANAEROBIC 10CC AEROBIC 5CC ANAEROBIC   Culture  Setup Time     Final   Value: 10/21/2013 08:56     Performed at [facility]   Culture     Final   Value:         BLOOD CULTURE RECEIVED NO GROWTH TO DATE CULTURE WILL BE HELD FOR 5 DAYS BEFORE ISSUING A FINAL NEGATIVE REPORT     Performed at [facility]   Report Status PENDING   Incomplete  URINE CULTURE     Status: None   Collection Time    10/21/13  2:58 PM      Result Value Ref Range Status   Specimen Description URINE, RANDOM   Final   Special Requests NONE   Final   Culture  Setup Time     Final   Value: 10/21/2013 16:17     [facility]formed at [facility]   Colony Count     Final   Value: NO GROWTH     Performed at [facility]   Culture     Final   Value: NO GROWTH     Performed at [facility]   Report Status 10/22/2013 FINAL   Final    RADIOLOGY STUDIES/RESULTS: Dg Chest 2 View  10/20/2013   CLINICAL DATA:  Syncope and altered mental status  EXAM: CHEST  2 VIEW  COMPARISON:  February 24, 2012  FINDINGS: There is underlying emphysema. There is a 2.7 x 2.7 cm nodular appearing lesion in the right upper lobe. Elsewhere lungs are clear. Heart size and pulmonary vascularity are normal. No adenopathy. There is degenerative change in the thoracic spine. There is an old healed fracture of the posterior left tenth rib.  IMPRESSION: 2.7 x 2.7 cm nodular appearing lesion right upper lobe. While this area could represent early pneumonia, its nodular appearance raises concern for potential neoplasm. Noncontrast enhanced chest CT advised to further assess.  Underlying emphysema.  These results were called by telephone at the time of interpretation on 10/20/2013 at 8:40 pm to Dr. Jeannett SeniorSTEPHEN  RANCOUR , who verbally acknowledged these results.   Electronically Signed   By: Bretta BangWilliam  Woodruff M.D.   On: 10/20/2013 20:41   Ct Head Wo Contrast  10/20/2013   CLINICAL DATA:  Patient found unresponsive by wife ; lethargic with altered mental status  EXAM: CT HEAD WITHOUT CONTRAST  TECHNIQUE: Contiguous axial images were obtained from the base of the skull through the vertex without intravenous  contrast.  COMPARISON:  February 24, 2012  FINDINGS: There is stable moderately severe diffuse atrophy. There is no mass, hemorrhage, extra-axial fluid collection, or midline shift. There is evidence of a prior infarct in the left lentiform nucleus, stable. There are small prior lacunar infarcts in each internal and external capsule. There is a small lacunar infarct in the mid left thalamus as well as a small infarct in the posterior right subthalamus, not seen previously and possibly recent. There is patchy small vessel disease throughout the centra semiovale bilaterally, stable.  Bony calvarium appears intact. Visualized mastoid air cells are clear. There is bilateral ethmoid sinus disease as well as mucosal thickening in each sphenoid sinus, slightly more on the left than on the right.  IMPRESSION: Small infarcts in the mid left thalamus and posterior right subthalamus not present previously. Age of these infarcts is uncertain. Other infarcts and small vessel disease appear stable compared to most recent prior study. Moderately severe diffuse atrophy is stable. No hemorrhage or mass effect. There is paranasal sinus disease as described.   Electronically Signed   By: Bretta BangWilliam  Woodruff M.D.   On: 10/20/2013 19:56   Ct Chest Wo Contrast  10/20/2013   CLINICAL DATA:  Abnormal chest radiograph.  EXAM: CT CHEST WITHOUT CONTRAST  TECHNIQUE: Multidetector CT imaging of the chest was performed following the standard protocol without IV contrast.  COMPARISON:  10/20/2013; 02/24/2012  FINDINGS: Examination is degraded secondary to patient respiratory artifact.  There are ill-defined heterogeneous airspace opacities within the right upper lobe which correlate with the findings on recently performed chest radiograph. Additionally, there are ill-defined interstitial airspace opacities within the bilateral lower lobes as well as the about the left hilum. Constellation of findings are worrisome for multifocal infection.  No  pleural effusion or pneumothorax. The central pulmonary airways are widely patent. No discrete pulmonary nodules given limitation of the examination.  Scattered shotty mediastinal lymph nodes are individually not enlarged by size criteria with index precarinal lymph node measuring 0.6 cm in greatest short axis diameter (image 19, series 2). No definite mediastinal, hilar or axillary lymphadenopathy on this noncontrast examination.  Normal heart size. No pericardial effusion. Scattered atherosclerotic plaque with a normal caliber thoracic aorta. Conventional configuration of the aortic arch.  Noncontrast evaluation of the upper abdomen demonstrates an approximately 0.7 cm hypo attenuating lesion within the dome of the right lobe of the liver, too small to adequately characterize of favored to represent a hepatic cyst. There is an additional approximately 1 cm hypo attenuating lesion within in the imaged left lobe of the liver which is incompletely imaged though also favored to represent hepatic cysts.  No definite acute or aggressive osseous abnormalities. The thyroid gland appears diminutive but otherwise normal.  IMPRESSION: Findings worrisome for multi lobar infection with possible etiologies including atypical organisms. A follow-up chest radiograph in 4-6 weeks after resolution is recommended to ensure resolution. No discrete pulmonary mass.   Electronically Signed   By: Simonne ComeJohn  Watts M.D.   On: 10/20/2013 21:45   Mr Brain Wo Contrast  10/21/2013  CLINICAL DATA:  Syncopal episode with transient loss of consciousness at home. No reported seizure activity. Elevated Alcohol level on admission. Underlying dementia.  EXAM: MRI HEAD WITHOUT CONTRAST  TECHNIQUE: Multiplanar, multiecho pulse sequences of the brain and surrounding structures were obtained without intravenous contrast.  COMPARISON:  CT head 10/20/2013.  MR head 01/10/2006.  FINDINGS: No acute infarct, hemorrhage, intracranial mass lesion, or extra-axial  fluid. Specifically no subdural or epidural hematoma. Global atrophy without obstructive hydrocephalus. Moderately advanced subcortical and periventricular white matter signal abnormality, consistent with chronic microvascular ischemic change. Scattered lacunar infarcts are all old. Flow voids are maintained. Heterogeneous upper cervical bone marrow, similar in appearance to 2007, without pathologic compression deformity, likely non worrisome. No parenchymal hemorrhage. No spinal stenosis.  RIGHT cataract extraction. Small air-fluid level RIGHT maxillary sinus.  Compared with prior CT, the appearance is unchanged. Compared with prior MR, atrophy has progressed.  IMPRESSION: Chronic changes as described. No acute intracranial abnormality. Specifically no acute stroke which might contribute to syncope. No posttraumatic sequelae are evident.   Electronically Signed   By: Davonna Belling M.D.   On: 10/21/2013 09:16   Dg Chest Port 1 View  10/22/2013   CLINICAL DATA:  Bilateral pneumonia  EXAM: PORTABLE CHEST - 1 VIEW  COMPARISON:  CT scan of the chest of October 20, 2013 and chest x-ray of the same day the  FINDINGS: The lungs remain hyperinflated. The interstitial markings are mildly increased throughout the right lung and at the left lung base. There is no pleural effusion or pneumothorax. The cardiac silhouette is normal in size. The pulmonary vascularity is not engorged. The bony thorax is unremarkable.  IMPRESSION: COPD. There are persistent mildly increased interstitial markings bilaterally consistent with pneumonia. There is no pleural effusion or pneumothorax. There is no evidence of CHF.   Electronically Signed   By: David  [place]   On: 10/22/2013 07:57    Jeoffrey Massed, MD  [facility] Pager:[phone]  If 7PM-7AM, please contact night-coverage [email or web address] Password [email or web address] 10/24/2013, 11:01 AM   LOS: 4 days

## 2013-10-24 NOTE — Progress Notes (Signed)
Patient ambulating in hall with walker (approx.200 feet). No acute distress noted; denied any pain, SOB or other discomfort.

## 2013-10-25 DIAGNOSIS — R001 Bradycardia, unspecified: Secondary | ICD-10-CM

## 2013-10-25 DIAGNOSIS — E43 Unspecified severe protein-calorie malnutrition: Secondary | ICD-10-CM

## 2013-10-25 MED ORDER — LEVOFLOXACIN 750 MG PO TABS: 750.0000 mg | ORAL_TABLET | Freq: Every day | ORAL | Status: AC

## 2013-10-25 MED ORDER — LEVOFLOXACIN 750 MG PO TABS
750.0000 mg | ORAL_TABLET | ORAL | Status: DC
  Administered 2013-10-25: 750 mg via ORAL
  Filled 2013-10-25: qty 1

## 2013-10-25 MED ORDER — ENSURE COMPLETE PO LIQD
237.0000 mL | Freq: Three times a day (TID) | ORAL | Status: DC
Start: 2013-10-25 — End: 2015-07-23

## 2013-10-25 NOTE — Progress Notes (Addendum)
Nsg Discharge Note  Admit Date:  10/20/2013 Discharge date: 10/25/2013   Cesar Maldonado to be D/C'd Home with Home Health PT per MD order.  AVS completed.  Copy for chart, and copy for patient signed, and dated. Patient/caregiver able to verbalize understanding.  Discharge Medication:   Medication List         aspirin 81 MG tablet  Take 81 mg by mouth daily.     CENTRUM PO  Take by mouth.     dorzolamide-timolol 22.3-6.8 MG/ML ophthalmic solution  Commonly known as:  COSOPT  1 drop 2 (two) times daily.     feeding supplement (ENSURE COMPLETE) Liqd  Take 237 mLs by mouth 3 (three) times daily between meals.     latanoprost 0.005 % ophthalmic solution  Commonly known as:  XALATAN  Place 1 drop into both eyes at bedtime.     levofloxacin 750 MG tablet  Commonly known as:  LEVAQUIN  Take 1 tablet (750 mg total) by mouth daily.     VITAMIN D PO  Take 1 tablet by mouth daily.        Discharge Assessment: Filed Vitals:   10/25/13 0553  BP: 158/76  Pulse: 68  Temp: 98.4 F (36.9 C)  Resp: 14   Skin clean, dry and intact without evidence of skin break down, no evidence of skin tears noted. IV catheter discontinued intact. Site without signs and symptoms of complications - no redness or edema noted at insertion site, patient denies c/o pain - only slight tenderness at site.  Dressing with slight pressure applied.  D/c Instructions-Education: Discharge instructions given to patient/family with verbalized understanding. D/c education completed with patient/family including follow up instructions, medication list, d/c activities limitations if indicated, with other d/c instructions as indicated by MD - patient able to verbalize understanding, all questions fully answered. Patient instructed to return to ED, call 911, or call MD for any changes in condition.  Patient escorted via WC, and D/C home via private auto.  Also, patient ambulated prior to discharge with tech, pulse  ox without oxygen remained at 95%-96%. Kern ReapBrumagin, Camara Rosander L, RN 10/25/2013 1:21 PM

## 2013-10-25 NOTE — Progress Notes (Signed)
CARE MANAGEMENT NOTE 10/25/2013  Patient:  Cesar Maldonado,Cesar Maldonado   Account Number:  [id number]  Date Initiated:  10/21/2013  Documentation initiated by:  Junius CreamerWELL,DEBBIE  Subjective/Objective Assessment:   adm w syncope     Action/Plan:   lives w wife   Anticipated DC Date:  10/25/2013   Anticipated DC Plan:  HOME W HOME HEALTH SERVICES  In-house referral  Clinical Social Worker      DC Associate Professorlanning Services  CM consult      [facility]   Choice offered to / List presented to:  C-3 Spouse   DME arranged  WALKER - Lavone NianOLLING      DME agency  [facility]     HH arranged  HH[facility]2 PT      [facility]   Status of service:  Completed, signed off Medicare Important Message given?  YES (If response is "NO", the following Medicare IM given date fields will be blank) Date Medicare IM given:  10/25/2013 Medicare IM given by:  [facility]Keelin Sheridan Date Additional Medicare IM given:   Additional Medicare IM given by:    Discharge Disposition:  HOME W HOME HEALTH SERVICES  Per UR Regulation:  Reviewed for med. necessity/level of care/duration of stay  If discussed at Long Length of Stay Meetings, dates discussed:    Comments:  10/25/2013 1227 NCM spoke to wife, [name]. [facility]ided [facility] and offered choice. [facility] N[facility] contacted [facility] for R[facility] for home for scheduled dc home today. Wife states she has discussed with pt PCP in the past about pt's drinking alcohol. Will discuss with PCP at next appt ways to wean him off the alcohol. States he has drinking for a long time. [facility]. CSW referral for resources. Isidoro DonningAlesia Malvern Kadlec RN CCM Case Mgmt phone [phone]  10/25/2013 1000 NCM spoke to pt and gave permission to speak to wife. States he does not have RW at home. Isidoro DonningAlesia Keiyon Plack RN CCM Case Mgmt phone [phone]

## 2013-10-25 NOTE — Progress Notes (Signed)
Speech pathology Discontinue swallow eval orders per M. [place]. Verma Grothaus L. Samson Fredericouture, [place] CCC/SLP Pager [phone]

## 2013-10-25 NOTE — Discharge Summary (Addendum)
Physician Discharge Summary  Cesar Maldonado:[id number] DOB: 22-May-1924 DOA: 10/20/2013  PCP: Cesar Slimmer, MD  Admit date: 10/20/2013 Discharge date: 10/25/2013  Time spent: 40 minutes  Recommendations for Outpatient Follow-up:  1. BMET and CBC in 1 week.  Follow up CXR in 2-4 weeks. 2. Home with home health PT.   Discharge Diagnoses:  Principal Problem:   Syncope and collapse Active Problems:   Hypertension   Dementia   BPH (benign prostatic hyperplasia)   Left ventricular diastolic dysfunction, NYHA class 1   Dehydration   Syncope   CAP (community acquired pneumonia)   Acute respiratory failure with hypoxia   Bradycardia   CKD (chronic kidney disease), stage III   Abnormal head CT   Protein-calorie malnutrition, severe   Discharge Condition:  stable  Diet recommendation: dysphagia 3  Filed Weights   10/20/13 2330  Weight: 48.988 kg (108 lb)    History of present illness:  Patient is a 80 y.o. male with past medical history of CVA, dementia, hypertension, mild diastolic congestive heart failure, and syncope, who presented after a syncopal episode. Per patient's wife, the patient went to the bathroom by himself after eating dinner.  His wife heard a thud and found the patient on the floor. He was unresponsive and vomited non-bloody vomitus. EMS was called and found the patient to be bradycardic with a HR in the 30s.  After arrival to ED, the patient became more alert and awake. His wife states that he has been coughing in the past several days. In ED CT-chest showed multi lobar infiltration.   Hospital Course:  Syncope and collapse  -Secondary to transient bradycardia-suspected vagal response (occured post vomiting) . Monitored in telemetry-review of the telemetry strips-negative for any significant bradycardia or tachyarrhythmias. -CT head showed suspected infarcts-but MRI Brain-neg for acute infarcts.  -Neurology was consulted.  They reviewed the MRI and  confirmed no acute infarcts -No recurrence after admission.  -Note-Admitting alcohol level 135 could explain syncope/vomiting-not sure if this was the case-will arrange for social worker with home health services. She denies any alcohol use, he is currently awake and alert mostly- however does have a history of dementia.  Acute respiratory failure with hypoxia  - Secondary to community-acquired pneumonia with concern for a component of aspiration. - Significantly improved with IV antibiotics-now on room air.  - History of COPD- received steroids but these were quickly tapered as an inpatient, encouraged spirometry.   Community acquired pneumonia  - Doing well, afebrile-leukocytosis likely from steroids (as clinically improved)  - treated with IV Azithromycin and Rocephin inpatient.  Will finish the course of antibiotics with Levaquin outpatient. - Blood cultures on 10/8 negative, influenza PCR negative as well.  Urine streptococcal and Legionella antigen negative.   Bradycardia- transient  - Suspect that this was secondary to vagal tone-as this occurred post vomiting. Given age, transient nature of this bradycardia- suspect no further workup required at this time. It would not change management.  -Monitored in telemetry-review of the telemetry strips-negative for any significant bradycardia or tachyarrhythmias.  Chronic combined systolic/diastolic heart failure  - Clinically compensated. EF on 10/8 Echo 45-50 percent/Grade 1 diastolic dysfucntion  - Given patient's continued soft BP/Frailty/advanced age suspect would not tolerate any CHF agents.   CKD (chronic kidney disease), stage III  - At baseline   Dementia  - Suspected at baseline-answering most questions appropriately   Protein-calorie malnutrition, severe  - Continued with supplements during hospital stay.  Will prescribe on discharge.  Procedures:  MRI brain without contrast (10/8)  See results below  Discharge  Exam: Filed Vitals:   10/25/13 0553  BP: 158/76  Pulse: 68  Temp: 98.4 F (36.9 C)  Resp: 14   General: Elderly male sitting up in chair eating breakfast.  NAD.  Appears stated age.  No family at bedside. HEENT:  Anicteric sclera, MMM.  Neck is supple.  No JVD or masses.   Cardiac: S1/S2, RRR, No murmurs, gallops or rubs  Pulm: Diminished breath sounds bilaterally.  No wheezes, or rales upon auscultation.  Abd: Soft, nondistended, nontender,  no organomegaly, BS present  Ext: Warm.  No cyanosis or edema. 2+PT pulse bilaterally  Neuro: Alert and oriented x 3. CN II-XII grossly intact, muscle strength 5/5 in all extremeties  Psych: Patient is not psychotic, no suicidal or hemocidal ideation.   Discharge Instructions Discharge Instructions   Call MD for:  temperature >100.4    Complete by:  As directed      Diet - low sodium heart healthy    Complete by:  As directed      Increase activity slowly    Complete by:  As directed           Current Discharge Medication List    START taking these medications   Details  feeding supplement, ENSURE COMPLETE, (ENSURE COMPLETE) LIQD Take 237 mLs by mouth 3 (three) times daily between meals. Qty: 90 Bottle, Refills: 3    levofloxacin (LEVAQUIN) 750 MG tablet Take 1 tablet (750 mg total) by mouth daily. Qty: 2 tablet, Refills: 0      CONTINUE these medications which have NOT CHANGED   Details  aspirin 81 MG tablet Take 81 mg by mouth daily.    Cholecalciferol (VITAMIN D PO) Take 1 tablet by mouth daily.    dorzolamide-timolol (COSOPT) 22.3-6.8 MG/ML ophthalmic solution 1 drop 2 (two) times daily.    latanoprost (XALATAN) 0.005 % ophthalmic solution Place 1 drop into both eyes at bedtime.    Multiple Vitamins-Minerals (CENTRUM PO) Take by mouth.       No Known Allergies Follow-up Information   Follow up with Cesar Slimmer, MD. Schedule an appointment as soon as possible for a visit in 1 week.   Specialty:  Internal Medicine    Contact information:   [address] Cesar Maldonado [place] [address] [phone]        The results of significant diagnostics from this hospitalization (including imaging, microbiology, ancillary and laboratory) are listed below for reference.    Significant Diagnostic Studies:  2D Echo Study Conclusions  - Left ventricle: The cavity size was normal. Wall thickness was normal. Systolic function was mildly reduced. The estimated ejection fraction was in the range of 45% to 50%. Diffuse hypokinesis. Doppler parameters are consistent with abnormal left ventricular relaxation (grade 1 diastolic dysfunction). - Right atrium: The atrium was mildly dilated.   Dg Chest 2 View  10/20/2013   CLINICAL DATA:  Syncope and altered mental status  EXAM: CHEST  2 VIEW  COMPARISON:  February 24, 2012  FINDINGS: There is underlying emphysema. There is a 2.7 x 2.7 cm nodular appearing lesion in the right upper lobe. Elsewhere lungs are clear. Heart size and pulmonary vascularity are normal. No adenopathy. There is degenerative change in the thoracic spine. There is an old healed fracture of the posterior left tenth rib.  IMPRESSION: 2.7 x 2.7 cm nodular appearing lesion right upper lobe. While this area could represent early pneumonia, its nodular appearance  raises concern for potential neoplasm. Noncontrast enhanced chest CT advised to further assess.  Underlying emphysema.  These results were called by telephone at the time of interpretation on 10/20/2013 at 8:40 pm to Dr. Glynn OctaveSTEPHEN RANCOUR , who verbally acknowledged these results.   Electronically Signed   By: Bretta BangWilliam  Woodruff M.D.   On: 10/20/2013 20:41   Ct Head Wo Contrast  10/20/2013   CLINICAL DATA:  Patient found unresponsive by wife ; lethargic with altered mental status  EXAM: CT HEAD WITHOUT CONTRAST  TECHNIQUE: Contiguous axial images were obtained from the base of the skull through the vertex without intravenous contrast.  COMPARISON:   February 24, 2012  FINDINGS: There is stable moderately severe diffuse atrophy. There is no mass, hemorrhage, extra-axial fluid collection, or midline shift. There is evidence of a prior infarct in the left lentiform nucleus, stable. There are small prior lacunar infarcts in each internal and external capsule. There is a small lacunar infarct in the mid left thalamus as well as a small infarct in the posterior right subthalamus, not seen previously and possibly recent. There is patchy small vessel disease throughout the centra semiovale bilaterally, stable.  Bony calvarium appears intact. Visualized mastoid air cells are clear. There is bilateral ethmoid sinus disease as well as mucosal thickening in each sphenoid sinus, slightly more on the left than on the right.  IMPRESSION: Small infarcts in the mid left thalamus and posterior right subthalamus not present previously. Age of these infarcts is uncertain. Other infarcts and small vessel disease appear stable compared to most recent prior study. Moderately severe diffuse atrophy is stable. No hemorrhage or mass effect. There is paranasal sinus disease as described.   Electronically Signed   By: Bretta BangWilliam  Woodruff M.D.   On: 10/20/2013 19:56   Ct Chest Wo Contrast  10/20/2013   CLINICAL DATA:  Abnormal chest radiograph.  EXAM: CT CHEST WITHOUT CONTRAST  TECHNIQUE: Multidetector CT imaging of the chest was performed following the standard protocol without IV contrast.  COMPARISON:  10/20/2013; 02/24/2012  FINDINGS: Examination is degraded secondary to patient respiratory artifact.  There are ill-defined heterogeneous airspace opacities within the right upper lobe which correlate with the findings on recently performed chest radiograph. Additionally, there are ill-defined interstitial airspace opacities within the bilateral lower lobes as well as the about the left hilum. Constellation of findings are worrisome for multifocal infection.  No pleural effusion or  pneumothorax. The central pulmonary airways are widely patent. No discrete pulmonary nodules given limitation of the examination.  Scattered shotty mediastinal lymph nodes are individually not enlarged by size criteria with index precarinal lymph node measuring 0.6 cm in greatest short axis diameter (image 19, series 2). No definite mediastinal, hilar or axillary lymphadenopathy on this noncontrast examination.  Normal heart size. No pericardial effusion. Scattered atherosclerotic plaque with a normal caliber thoracic aorta. Conventional configuration of the aortic arch.  Noncontrast evaluation of the upper abdomen demonstrates an approximately 0.7 cm hypo attenuating lesion within the dome of the right lobe of the liver, too small to adequately characterize of favored to represent a hepatic cyst. There is an additional approximately 1 cm hypo attenuating lesion within in the imaged left lobe of the liver which is incompletely imaged though also favored to represent hepatic cysts.  No definite acute or aggressive osseous abnormalities. The thyroid gland appears diminutive but otherwise normal.  IMPRESSION: Findings worrisome for multi lobar infection with possible etiologies including atypical organisms. A follow-up chest radiograph in 4-6 weeks after  resolution is recommended to ensure resolution. No discrete pulmonary mass.   Electronically Signed   By: Simonne Come M.D.   On: 10/20/2013 21:45   Mr Brain Wo Contrast  10/21/2013   CLINICAL DATA:  Syncopal episode with transient loss of consciousness at home. No reported seizure activity. Elevated Alcohol level on admission. Underlying dementia.  EXAM: MRI HEAD WITHOUT CONTRAST  TECHNIQUE: Multiplanar, multiecho pulse sequences of the brain and surrounding structures were obtained without intravenous contrast.  COMPARISON:  CT head 10/20/2013.  MR head 01/10/2006.  FINDINGS: No acute infarct, hemorrhage, intracranial mass lesion, or extra-axial fluid. Specifically  no subdural or epidural hematoma. Global atrophy without obstructive hydrocephalus. Moderately advanced subcortical and periventricular white matter signal abnormality, consistent with chronic microvascular ischemic change. Scattered lacunar infarcts are all old. Flow voids are maintained. Heterogeneous upper cervical bone marrow, similar in appearance to 2007, without pathologic compression deformity, likely non worrisome. No parenchymal hemorrhage. No spinal stenosis.  RIGHT cataract extraction. Small air-fluid level RIGHT maxillary sinus.  Compared with prior CT, the appearance is unchanged. Compared with prior MR, atrophy has progressed.  IMPRESSION: Chronic changes as described. No acute intracranial abnormality. Specifically no acute stroke which might contribute to syncope. No posttraumatic sequelae are evident.   Electronically Signed   By: Davonna Belling M.D.   On: 10/21/2013 09:16   Dg Chest Port 1 View  10/22/2013   CLINICAL DATA:  Bilateral pneumonia  EXAM: PORTABLE CHEST - 1 VIEW  COMPARISON:  CT scan of the chest of October 20, 2013 and chest x-ray of the same day the  FINDINGS: The lungs remain hyperinflated. The interstitial markings are mildly increased throughout the right lung and at the left lung base. There is no pleural effusion or pneumothorax. The cardiac silhouette is normal in size. The pulmonary vascularity is not engorged. The bony thorax is unremarkable.  IMPRESSION: COPD. There are persistent mildly increased interstitial markings bilaterally consistent with pneumonia. There is no pleural effusion or pneumothorax. There is no evidence of CHF.   Electronically Signed   By: David  [place]   On: 10/22/2013 07:57    Microbiology: Recent Results (from the past 240 hour(s))  CULTURE, BLOOD (ROUTINE X 2)     Status: None   Collection Time    10/21/13  2:52 AM      Result Value Ref Range Status   Specimen Description BLOOD LEFT ARM   Final   Special Requests BOTTLES DRAWN AEROBIC AND  ANAEROBIC 10CC EACH   Final   Culture  Setup Time     Final   Value: 10/21/2013 08:56     Performed at [facility]   Culture     Final   Value:        BLOOD CULTURE RECEIVED NO GROWTH TO DATE CULTURE WILL BE HELD FOR 5 DAYS BEFORE ISSUING A FINAL NEGATIVE REPORT     Performed at [facility]   Report Status PENDING   Incomplete  CULTURE, BLOOD (ROUTINE X 2)     Status: None   Collection Time    10/21/13  2:59 AM      Result Value Ref Range Status   Specimen Description BLOOD LEFT FOREARM   Final   Special Requests     Final   Value: BOTTLES DRAWN AEROBIC AND ANAEROBIC 10CC AEROBIC 5CC ANAEROBIC   Culture  Setup Time     Final   Value: 10/21/2013 08:56     Performed at [facility]  Culture     Final   Value:        BLOOD CULTURE RECEIVED NO GROWTH TO DATE CULTURE WILL BE HELD FOR 5 DAYS BEFORE ISSUING A FINAL NEGATIVE REPORT     [facility]   Report Status PENDING   Incomplete  URINE CULTURE     Status: None   Collection Time    10/21/13  2:58 PM      Result Value Ref Range Status   Specimen Description URINE, RANDOM   Final   Special Requests NONE   Final   Culture  Setup Time     Final   Value: 10/21/2013 16:17     [facility]formed [facility]   Colony Count     Final   Value: NO GROWTH     [facility]   Culture     Final   Value: NO GROWTH     [facility]formed at [facility]   Report Status 10/22/2013 FINAL   Final     Labs: Basic Metabolic Panel:  Recent Labs Lab 10/20/13 1847 10/21/13 1149 10/22/13 0315 10/23/13 0336  NA 136* 135* 138 139  K 4.2 3.6* 3.8 4.4  CL 98 100 99 100  CO2 24 23 27 28   GLUCOSE 98 96 78 123*  BUN 11 14 20  26*  CREATININE 1.13 1.23 1.38* 1.14  CALCIUM 9.1 8.5 8.5 9.2   Liver Function Tests:  Recent Labs Lab 10/20/13 1847 10/21/13 1149 10/22/13 0315  AST 21 21 22   ALT 12 10 10   ALKPHOS 59 38* 67  BILITOT <0.2* 0.7 0.3  PROT 7.9 7.0 6.7  ALBUMIN  3.5 2.9* 2.6*   CBC:  Recent Labs Lab 10/20/13 1847 10/21/13 1149 10/22/13 0315 10/23/13 0336  WBC 4.5 9.9 13.8* 15.3*  NEUTROABS 3.1 8.8*  --   --   HGB 11.0* 11.2* 10.0* 10.4*  HCT 33.1* 32.9* 30.9* 31.1*  MCV 85.1 84.8 85.1 84.7  PLT 172 140* 133* 149*   Cardiac Enzymes:  Recent Labs Lab 10/20/13 1847 10/21/13 1149 10/21/13 1302  TROPONINI <0.30 <0.30 <0.30   Signed:  Randol KernKeely Reichel, PA-S Algis DownsMarianne York, PA-C [facility] 10/25/2013, 11:11 AM  Attending Patient was seen, examined,treatment plan was discussed with the  Advance Practice Provider.  I have directly reviewed the clinical findings, lab, imaging studies and management of this patient in detail. I have made the necessary changes to the above noted documentation, and agree with the documentation, as recorded by the Advance Practice Provider.   Continues to improve, doing well. He is ambulating without any assistance toward nursing staff. Will transition to oral Levaquin and should be able to discharg today.  Windell NorfolkS Tupac Jeffus MD [facility].

## 2013-10-25 NOTE — Discharge Instructions (Signed)
Take Levaquin on the mornings of 10/13 and 10/14.  Your symptoms should continue to improve over the next week, even after completing the antibiotic.  Continue to walk as tolerated with walker.    When eating sit straight. Eat softer foods and take small bites to prevent the risk of aspiration.

## 2013-10-25 NOTE — Progress Notes (Signed)
Physical Therapy Treatment Patient Details Name: Cesar Maldonado MRN: [id number] DOB: [birth year] Today's Date: 10/25/2013    History of Present Illness Pt is an 80 y.o. male with a past medical history that is relevant for HTN, syncope, AD, ischemic stroke, and BPH, brought in by wife after sustaining an episode of transient loss of consciousness at home. Pt states that he doesn't recall what happened and why he is in the hospital, but his wife is at the bedside and stated that they were home, patient went to the bathroom by himself after eating dinner, then she heard a thud and found him unresponsive on the floor. He vomited but did not have seizure like activity. EMS was called and found that patient was bradycardic in the 30s. Pacing was entertained but his mental status and heart rate improved. CT brain upon arrival showed findings consistent with small infarcts in the mid left thalamus and posterior right subthalamus.     PT Comments    Pt able to negotiate stairs with step through pattern and 2 rails and min/guard A.  Follow Up Recommendations  Home health PT;Supervision/Assistance - 24 hour     Equipment Recommendations  Rolling walker with 5" wheels    Recommendations for Other Services       Precautions / Restrictions Precautions Precautions: Fall Restrictions Weight Bearing Restrictions: No    Mobility  Bed Mobility               General bed mobility comments: not assessed- pt up in recliner before and after session  Transfers     Transfers: Sit to/from Stand Sit to Stand: Supervision            Ambulation/Gait Ambulation/Gait assistance: Supervision Ambulation Distance (Feet): 220 Feet Assistive device: Rolling walker (2 wheeled);None Gait Pattern/deviations: Step-through pattern     General Gait Details: amb with RW outside of room and without RW in room   Stairs Stairs: Yes Stairs assistance: Min guard Stair Management: Two rails;Alternating  pattern;Forwards Number of Stairs: 4 General stair comments: Pt able to perform with step through pattern  Wheelchair Mobility    Modified Rankin (Stroke Patients Only)       Balance           Standing balance support: No upper extremity supported Standing balance-Leahy Scale: Fair                      Cognition Arousal/Alertness: Awake/alert Behavior During Therapy: WFL for tasks assessed/performed Overall Cognitive Status: Within Functional Limits for tasks assessed                      Exercises      General Comments        Pertinent Vitals/Pain      Home Living                      Prior Function            PT Goals (current goals can now be found in the care plan section) Acute Rehab PT Goals Patient Stated Goal: To return home with wife PT Goal Formulation: With patient/family Progress towards PT goals: Progressing toward goals;Goals met and updated - see care plan    Frequency  Min 3X/week    PT Plan Current plan remains appropriate    Co-evaluation             End of Session Equipment Utilized During Treatment:  Gait belt Activity Tolerance: Patient tolerated treatment well Patient left: in chair;with chair alarm set;with call bell/phone within reach     Time: 2518-9842 PT Time Calculation (min): 13 min  Charges:  $Gait Training: 8-22 mins                    G Codes:      Ragan Duhon LUBECK 10/25/2013, 10:33 AM

## 2013-10-27 LAB — CULTURE, BLOOD (ROUTINE X 2)
CULTURE: NO GROWTH
Culture: NO GROWTH

## 2013-10-29 ENCOUNTER — Ambulatory Visit (HOSPITAL_COMMUNITY)
Admission: RE | Admit: 2013-10-29 | Discharge: 2013-10-29 | Disposition: A | Discharge status: Home / Self Care | Payer: Medicare Other | Source: Ambulatory Visit | Attending: Internal Medicine | Admitting: Internal Medicine

## 2013-10-29 ENCOUNTER — Other Ambulatory Visit: Payer: Self-pay | Admitting: Internal Medicine

## 2013-10-29 DIAGNOSIS — I700 Atherosclerosis of aorta: Secondary | ICD-10-CM | POA: Insufficient documentation

## 2013-10-29 DIAGNOSIS — J449 Chronic obstructive pulmonary disease, unspecified: Secondary | ICD-10-CM | POA: Diagnosis present

## 2013-10-29 DIAGNOSIS — R05 Cough: Secondary | ICD-10-CM

## 2013-10-29 DIAGNOSIS — J189 Pneumonia, unspecified organism: Secondary | ICD-10-CM | POA: Diagnosis not present

## 2013-10-29 DIAGNOSIS — R053 Chronic cough: Secondary | ICD-10-CM

## 2014-01-03 ENCOUNTER — Other Ambulatory Visit (HOSPITAL_COMMUNITY): Payer: Self-pay | Admitting: Cardiology

## 2014-01-03 NOTE — Telephone Encounter (Signed)
PATIENT MEDICATION DENIED  PATIENT NOT IN SYSTEM SEEING DR HARDING 2015 0R 2014

## 2014-01-17 DIAGNOSIS — H2512 Age-related nuclear cataract, left eye: Secondary | ICD-10-CM | POA: Diagnosis not present

## 2014-01-17 DIAGNOSIS — H4011X0 Primary open-angle glaucoma, stage unspecified: Secondary | ICD-10-CM | POA: Diagnosis not present

## 2014-01-17 DIAGNOSIS — H2513 Age-related nuclear cataract, bilateral: Secondary | ICD-10-CM | POA: Diagnosis not present

## 2014-02-03 DIAGNOSIS — M150 Primary generalized (osteo)arthritis: Secondary | ICD-10-CM | POA: Diagnosis not present

## 2014-02-03 DIAGNOSIS — I10 Essential (primary) hypertension: Secondary | ICD-10-CM | POA: Diagnosis not present

## 2014-02-03 DIAGNOSIS — F09 Unspecified mental disorder due to known physiological condition: Secondary | ICD-10-CM | POA: Diagnosis not present

## 2014-02-07 DIAGNOSIS — G309 Alzheimer's disease, unspecified: Secondary | ICD-10-CM | POA: Diagnosis not present

## 2014-02-07 DIAGNOSIS — Z125 Encounter for screening for malignant neoplasm of prostate: Secondary | ICD-10-CM | POA: Diagnosis not present

## 2014-02-07 DIAGNOSIS — I10 Essential (primary) hypertension: Secondary | ICD-10-CM | POA: Diagnosis not present

## 2014-02-07 DIAGNOSIS — D649 Anemia, unspecified: Secondary | ICD-10-CM | POA: Diagnosis not present

## 2014-02-07 DIAGNOSIS — E539 Vitamin B deficiency, unspecified: Secondary | ICD-10-CM | POA: Diagnosis not present

## 2014-02-15 DIAGNOSIS — H2512 Age-related nuclear cataract, left eye: Secondary | ICD-10-CM | POA: Diagnosis not present

## 2014-02-24 DIAGNOSIS — M150 Primary generalized (osteo)arthritis: Secondary | ICD-10-CM | POA: Diagnosis not present

## 2014-02-24 DIAGNOSIS — F09 Unspecified mental disorder due to known physiological condition: Secondary | ICD-10-CM | POA: Diagnosis not present

## 2014-02-24 DIAGNOSIS — I10 Essential (primary) hypertension: Secondary | ICD-10-CM | POA: Diagnosis not present

## 2014-03-07 DIAGNOSIS — F09 Unspecified mental disorder due to known physiological condition: Secondary | ICD-10-CM | POA: Diagnosis not present

## 2014-03-07 DIAGNOSIS — I10 Essential (primary) hypertension: Secondary | ICD-10-CM | POA: Diagnosis not present

## 2014-03-07 DIAGNOSIS — M150 Primary generalized (osteo)arthritis: Secondary | ICD-10-CM | POA: Diagnosis not present

## 2014-03-23 DIAGNOSIS — M150 Primary generalized (osteo)arthritis: Secondary | ICD-10-CM | POA: Diagnosis not present

## 2014-03-23 DIAGNOSIS — I10 Essential (primary) hypertension: Secondary | ICD-10-CM | POA: Diagnosis not present

## 2014-03-23 DIAGNOSIS — F09 Unspecified mental disorder due to known physiological condition: Secondary | ICD-10-CM | POA: Diagnosis not present

## 2014-04-11 ENCOUNTER — Ambulatory Visit (INDEPENDENT_AMBULATORY_CARE_PROVIDER_SITE_OTHER): Payer: Medicare Other | Admitting: Neurology

## 2014-04-11 ENCOUNTER — Encounter: Payer: Self-pay | Admitting: Neurology

## 2014-04-11 VITALS — BP 162/86 | HR 61 | Temp 97.3°F | Ht 70.0 in | Wt 114.0 lb

## 2014-04-11 DIAGNOSIS — E785 Hyperlipidemia, unspecified: Secondary | ICD-10-CM

## 2014-04-11 DIAGNOSIS — I6381 Other cerebral infarction due to occlusion or stenosis of small artery: Secondary | ICD-10-CM

## 2014-04-11 DIAGNOSIS — R531 Weakness: Secondary | ICD-10-CM

## 2014-04-11 DIAGNOSIS — R29898 Other symptoms and signs involving the musculoskeletal system: Secondary | ICD-10-CM

## 2014-04-11 DIAGNOSIS — I6350 Cerebral infarction due to unspecified occlusion or stenosis of unspecified cerebral artery: Secondary | ICD-10-CM | POA: Diagnosis not present

## 2014-04-11 DIAGNOSIS — E1310 Other specified diabetes mellitus with ketoacidosis without coma: Secondary | ICD-10-CM | POA: Diagnosis not present

## 2014-04-11 DIAGNOSIS — I639 Cerebral infarction, unspecified: Secondary | ICD-10-CM

## 2014-04-11 DIAGNOSIS — R7302 Impaired glucose tolerance (oral): Secondary | ICD-10-CM | POA: Diagnosis not present

## 2014-04-11 DIAGNOSIS — M6281 Muscle weakness (generalized): Secondary | ICD-10-CM | POA: Diagnosis not present

## 2014-04-11 DIAGNOSIS — M6289 Other specified disorders of muscle: Secondary | ICD-10-CM

## 2014-04-11 NOTE — Progress Notes (Signed)
[facility]    Provider:  Dr Lucia GaskinsAhern Referring Provider: Laurena Slimmerlark, [name], MD Primary Care Physician:  Laurena SlimmerLARK,[name], MD  CC: Acute onset right leg weakness  HPI:  Cesar Maldonado is a 81 y.o. male here as a referral from Dr. Chestine Sporelark for righg leg weakness  One episode of right leg weakness. We opened a door and his right leg "went". Wife and daughter provides most of information, keeps trying to tell patient to speak.  Numbness on the whole lower right leg. No back pain. It went limp acutely, couldn't do anything with it. Not painful but very weak. He couldn't step, he couldn't move. Did not fall on the ground but went pretty low and wife got him a chair otherwise would have fallen. Did not notice arm weakness, no aphasia or dysarthria. No pain. Resolution of symptoms for the most part, maybe some resultant numbness and weakness (patient has dementia, poor historian). On baby aspirin every day.  Reviewed notes, labs and imaging from outside physicians, which showed:Personally reviewed MRi of the brain in 2015 which reveals chronic small-vessel ischemic disease, scattered remote lacunar infarcts and global atrophy.   Review of Systems: Patient complains of symptoms per HPI as well as the following symptoms: loss of vision, fatigue, snoring, swelling in legs, itching, runny nose, numbness, dizziness,disinterest in activities. Pertinent negatives per HPI. All others negative.   History   Social History  . Marital Status: Married    Spouse Name: Cesar Maldonado  . Number of Children: 4  . Years of Education: 12   Occupational History  . Retired, plaster work    Social History Main Topics  . Smoking status: Former Smoker    Quit date: 01/15/1988  . Smokeless tobacco: Not on file  . Alcohol Use: No  . Drug Use: No  . Sexual Activity: Not on file   Other Topics Concern  . Not on file   Social History Narrative   Married.  Lives with wife.  Ambulates independently.   Caffeine use: Drinks coffee 1 cup per day at most   Drinks 1/2 can soda per day     Family History  Problem Relation Age of Onset  . Cancer Sister     Past Medical History  Diagnosis Date  . Mental disorder     alzhiemers  . Hypertension   . Dementia   . BPH (benign prostatic hyperplasia)   . Syncope   . Left ventricular diastolic dysfunction, NYHA class 1   . Cerebrovascular disease     Chronic ischemic microvascular white matter disease and central atrophy on CT scan    Past Surgical History  Procedure Laterality Date  . Breast surgery      left tumor  . Pars plana vitrectomy  12/19/2010    Procedure: PARS PLANA VITRECTOMY WITH INTRAOCULAR LENS;  Surgeon: Shade FloodGreer Geiger, MD;  Location: [facility] OR;  Service: Ophthalmology;  Laterality: Right;  PARS PLANA VITRECTOMY WITH INTRAOCULAR LENS [900]membrane peel,endolaser right eye    Current Outpatient Prescriptions  Medication Sig Dispense Refill  . aspirin 81 MG tablet Take 81 mg by mouth daily.    . Cholecalciferol (VITAMIN D PO) Take 1 tablet by mouth daily.    . dorzolamide-timolol (COSOPT) 22.3-6.8 MG/ML ophthalmic solution 1 drop 2 (two) times daily.    . feeding supplement, ENSURE COMPLETE, (ENSURE COMPLETE) LIQD Take 237 mLs by mouth 3 (three) times daily between meals. (Patient not taking: Reported on 04/11/2014) 90 Bottle 3  . lactose free  nutrition (BOOST) LIQD Take 237 mLs by mouth daily.    Marland Kitchen latanoprost (XALATAN) 0.005 % ophthalmic solution Place 1 drop into both eyes at bedtime.    . Multiple Vitamins-Minerals (CENTRUM PO) Take by mouth.     No current facility-administered medications for this visit.   Facility-Administered Medications Ordered in Other Visits  Medication Dose Route Frequency Provider Last Rate Last Dose  . 0.3 mL EPINEPHrine 1 mg/mL(1:1000) in BSS (500 mL) irrigation    PRN Shade Flood, MD      . balanced salts (BSS) ophthalmic solution    PRN Shade Flood, MD   500 mL at 12/19/10 1045  . ceFAZolin  (ANCEF) 200 mg/mL injection    PRN Shade Flood, MD   1 mL at 12/19/10 0940    Allergies as of 04/11/2014  . (No Known Allergies)    Vitals: BP 162/86 mmHg  Pulse 61  Temp(Src) 97.3 F (36.3 C)  Ht  (1.778 m)  Wt 114 lb (51.71 kg)  BMI 16.36 kg/m2 Last Weight:  Wt Readings from Last 1 Encounters:  04/11/14 114 lb (51.71 kg)   Last Height:   Ht Readings from Last 1 Encounters:  04/11/14  (1.778 m)   Physical exam: Exam: Gen: NAD, conversant, well nourised, well groomed                     CV: RRR, no MRG. No Carotid Bruits. No peripheral edema, warm, nontender Eyes: Conjunctivae clear without exudates or hemorrhage  Neuro: Detailed Neurologic Exam  Speech:    Speech is normal; fluent and spontaneous with normal comprehension.  Cognition:    The patient is oriented to person, month, day doesn' t know what year     recent memory impaired and remote memory intact;     language fluent;     Impaired attention, concentration,     fund of knowledge impaired Cranial Nerves:    The pupils are equal, round, and reactive to light. Could not visualize fundi, attempted but small pupils. Visual fields are full to finger confrontation. Extraocular movements are intact. Trigeminal sensation is intact and the muscles of mastication are normal. Left lower face asymmetry. The palate elevates in the midline. Hearing impaired. Voice is normal. Shoulder shrug is normal. The tongue has normal motion without fasciculations.   Coordination:    Normal finger to nose and heel to shin.    Gait:    Heel-toe are normal. Not ataxic.  Motor Observation:    No asymmetry, no atrophy, and no involuntary movements noted. Tone:    Normal muscle tone.    Posture:    Posture is normal. normal erect    Strength:mild bilateral hip flexion weakness right > left    Otherwise trength is V/V in the upper and lower limbs.      Sensation: intact to LT     Reflex Exam:  DTR's:    Deep  tendon reflexes in the upper and lower extremities are normal bilaterally.   Toes:    Left toe upgoing.   Clonus:    Clonus is absent.       Assessment/Plan:  81 year old male with PMHx small-vessel disease with lacunar infarcts who is here for acute onset right leg weakness and numbness without pain. Suggests stroke.   Baseline memory issues - Alzheimers Right leg weakness, left upgoing toe. Acute onset right leg weakness and numbness.Will order MRI of the brain and if recent strokes will change  ASA to plavix Not on Lipitor, will check cholesterol Will also check HgbA1c, CMP and CBC  Naomie Dean, MD  [facility] [address] [place], [place] [address]  Phone [phone] Fax [phone]

## 2014-04-11 NOTE — Patient Instructions (Signed)
Overall you are doing fairly well but I do want to suggest a few things today:   Remember to drink plenty of fluid, eat healthy meals and do not skip any meals. Try to eat protein with a every meal and eat a healthy snack such as fruit or nuts in between meals. Try to keep a regular sleep-wake schedule and try to exercise daily, particularly in the form of walking, 20-30 minutes a day, if you can.   As far as your medications are concerned, I would like to suggest: continue daily aspirin  As far as diagnostic testing: MRi brain, labs, carotid duplex   I would like to see you back in 3 months, sooner if we need to. Please call us with any interim questions, concerns, problems, updates or refill requests.   Please also call us for any test results so we can go over those with you on the phone.  My clinical assistant and will answer any of your questions and relay your messages to me and also relay most of my messages to you.   Our phone number is [phone]. We also have an after hours call service for urgent matters and there is a physician on-call for urgent questions. For any emergencies you know to call 911 or go to the nearest emergency room

## 2014-04-12 ENCOUNTER — Telehealth: Payer: Self-pay | Admitting: Neurology

## 2014-04-12 LAB — COMPREHENSIVE METABOLIC PANEL
ALBUMIN: 4.2 g/dL (ref 3.5–4.7)
ALT: 72 IU/L — ABNORMAL HIGH (ref 0–44)
AST: 51 IU/L — AB (ref 0–40)
Albumin/Globulin Ratio: 1.4 (ref 1.1–2.5)
Alkaline Phosphatase: 56 IU/L (ref 39–117)
BILIRUBIN TOTAL: 0.3 mg/dL (ref 0.0–1.2)
BUN / CREAT RATIO: 13 (ref 10–22)
BUN: 15 mg/dL (ref 8–27)
CO2: 25 mmol/L (ref 18–29)
CREATININE: 1.13 mg/dL (ref 0.76–1.27)
Calcium: 9.7 mg/dL (ref 8.6–10.2)
Chloride: 100 mmol/L (ref 97–108)
GFR calc non Af Amer: 58 mL/min/{1.73_m2} — ABNORMAL LOW (ref >59)
GFR, EST AFRICAN AMERICAN: 67 mL/min/{1.73_m2} (ref >59)
GLUCOSE: 88 mg/dL (ref 65–99)
Globulin, Total: 3 g/dL (ref 1.5–4.5)
Potassium: 4.6 mmol/L (ref 3.5–5.2)
SODIUM: 139 mmol/L (ref 134–144)
Total Protein: 7.2 g/dL (ref 6.0–8.5)

## 2014-04-12 LAB — LIPID PANEL
CHOLESTEROL TOTAL: 189 mg/dL (ref 100–199)
Chol/HDL Ratio: 2.5 ratio units (ref 0.0–5.0)
HDL: 76 mg/dL (ref >39)
LDL Calculated: 101 mg/dL — ABNORMAL HIGH (ref 0–99)
TRIGLYCERIDES: 58 mg/dL (ref 0–149)
VLDL Cholesterol Cal: 12 mg/dL (ref 5–40)

## 2014-04-12 LAB — CBC
HCT: 33.5 % — ABNORMAL LOW (ref 37.5–51.0)
Hemoglobin: 10.5 g/dL — ABNORMAL LOW (ref 12.6–17.7)
MCH: 26.8 pg (ref 26.6–33.0)
MCHC: 31.3 g/dL — AB (ref 31.5–35.7)
MCV: 86 fL (ref 79–97)
PLATELETS: 186 10*3/uL (ref 150–379)
RBC: 3.92 x10E6/uL — ABNORMAL LOW (ref 4.14–5.80)
RDW: 16.6 % — ABNORMAL HIGH (ref 12.3–15.4)
WBC: 3.5 10*3/uL (ref 3.4–10.8)

## 2014-04-12 LAB — HEMOGLOBIN A1C
Est. average glucose Bld gHb Est-mCnc: 131 mg/dL
HEMOGLOBIN A1C: 6.2 % — AB (ref 4.8–5.6)

## 2014-04-12 NOTE — Telephone Encounter (Signed)
Spoke with wife. Patient's LDL was 101 and goal is <70. Considering he had multiple lacunar infarcts on MRI of the brain in 2015, recommend starting low-dose lipitor and continuing daily baby aspirin. Wife prefers to discuss statin with his primary care. His HgbA1c was 6.2, glucose intolerance. He also had some mild anemia 10.5/33.5 which looks stable from 5 months ago. Patient and wife would like to see pcp, she is going to schedule a follow up to discuss. I will send labs results as well.

## 2014-04-25 ENCOUNTER — Ambulatory Visit
Admission: RE | Admit: 2014-04-25 | Discharge: 2014-04-25 | Disposition: A | Discharge status: Home / Self Care | Payer: Medicare Other | Source: Ambulatory Visit | Attending: Neurology | Admitting: Neurology

## 2014-04-25 DIAGNOSIS — R29898 Other symptoms and signs involving the musculoskeletal system: Secondary | ICD-10-CM

## 2014-04-25 DIAGNOSIS — G319 Degenerative disease of nervous system, unspecified: Secondary | ICD-10-CM | POA: Diagnosis not present

## 2014-04-25 DIAGNOSIS — M6289 Other specified disorders of muscle: Secondary | ICD-10-CM | POA: Diagnosis not present

## 2014-04-25 DIAGNOSIS — I6381 Other cerebral infarction due to occlusion or stenosis of small artery: Secondary | ICD-10-CM

## 2014-04-25 DIAGNOSIS — I6782 Cerebral ischemia: Secondary | ICD-10-CM | POA: Diagnosis not present

## 2014-04-25 DIAGNOSIS — R531 Weakness: Secondary | ICD-10-CM

## 2014-04-27 ENCOUNTER — Ambulatory Visit (INDEPENDENT_AMBULATORY_CARE_PROVIDER_SITE_OTHER): Payer: Medicare Other

## 2014-04-27 DIAGNOSIS — R7302 Impaired glucose tolerance (oral): Secondary | ICD-10-CM

## 2014-04-27 DIAGNOSIS — E785 Hyperlipidemia, unspecified: Secondary | ICD-10-CM

## 2014-04-27 DIAGNOSIS — I639 Cerebral infarction, unspecified: Secondary | ICD-10-CM | POA: Diagnosis not present

## 2014-04-27 DIAGNOSIS — R29898 Other symptoms and signs involving the musculoskeletal system: Secondary | ICD-10-CM

## 2014-04-27 DIAGNOSIS — R531 Weakness: Secondary | ICD-10-CM

## 2014-04-27 DIAGNOSIS — I6381 Other cerebral infarction due to occlusion or stenosis of small artery: Secondary | ICD-10-CM

## 2014-05-02 ENCOUNTER — Telehealth: Payer: Self-pay | Admitting: Neurology

## 2014-05-02 NOTE — Telephone Encounter (Signed)
Spoke to patient. No definitive new stroke on MRI. Extensive white matter foci most consistent with chronic microvascular ischemic change. Additionally, there are some remote lacunar infarctions, the largest in the head of the left caudate nucleus.Severe atrophy that is most pronounced in the mesial temporal lobes. Asked them to call back to discuss, thanks

## 2014-05-12 ENCOUNTER — Telehealth: Payer: Self-pay | Admitting: *Deleted

## 2014-05-12 NOTE — Telephone Encounter (Signed)
Talked with patient about to let him know his carotid duplex was normal per Dr. Lucia GaskinsAhern instructions. Pt verbalized understanding.

## 2014-06-07 DIAGNOSIS — H4011X4 Primary open-angle glaucoma, indeterminate stage: Secondary | ICD-10-CM | POA: Diagnosis not present

## 2014-06-09 DIAGNOSIS — I10 Essential (primary) hypertension: Secondary | ICD-10-CM | POA: Diagnosis not present

## 2014-06-09 DIAGNOSIS — M150 Primary generalized (osteo)arthritis: Secondary | ICD-10-CM | POA: Diagnosis not present

## 2014-06-09 DIAGNOSIS — F09 Unspecified mental disorder due to known physiological condition: Secondary | ICD-10-CM | POA: Diagnosis not present

## 2014-07-14 DIAGNOSIS — H4011X4 Primary open-angle glaucoma, indeterminate stage: Secondary | ICD-10-CM | POA: Diagnosis not present

## 2014-08-01 DIAGNOSIS — M150 Primary generalized (osteo)arthritis: Secondary | ICD-10-CM | POA: Diagnosis not present

## 2014-08-01 DIAGNOSIS — F09 Unspecified mental disorder due to known physiological condition: Secondary | ICD-10-CM | POA: Diagnosis not present

## 2014-08-01 DIAGNOSIS — I10 Essential (primary) hypertension: Secondary | ICD-10-CM | POA: Diagnosis not present

## 2014-08-04 ENCOUNTER — Encounter: Payer: Self-pay | Admitting: *Deleted

## 2014-08-18 DIAGNOSIS — H4011X4 Primary open-angle glaucoma, indeterminate stage: Secondary | ICD-10-CM | POA: Diagnosis not present

## 2014-09-07 ENCOUNTER — Encounter: Payer: Self-pay | Admitting: Cardiology

## 2014-10-12 DIAGNOSIS — Z23 Encounter for immunization: Secondary | ICD-10-CM | POA: Diagnosis not present

## 2014-11-23 DIAGNOSIS — I708 Atherosclerosis of other arteries: Secondary | ICD-10-CM | POA: Diagnosis not present

## 2014-11-23 DIAGNOSIS — Z961 Presence of intraocular lens: Secondary | ICD-10-CM | POA: Diagnosis not present

## 2014-11-23 DIAGNOSIS — H401122 Primary open-angle glaucoma, left eye, moderate stage: Secondary | ICD-10-CM | POA: Diagnosis not present

## 2014-11-23 DIAGNOSIS — H401112 Primary open-angle glaucoma, right eye, moderate stage: Secondary | ICD-10-CM | POA: Diagnosis not present

## 2014-12-07 DIAGNOSIS — Z8673 Personal history of transient ischemic attack (TIA), and cerebral infarction without residual deficits: Secondary | ICD-10-CM | POA: Diagnosis not present

## 2014-12-07 DIAGNOSIS — R262 Difficulty in walking, not elsewhere classified: Secondary | ICD-10-CM | POA: Diagnosis not present

## 2014-12-07 DIAGNOSIS — S82002A Unspecified fracture of left patella, initial encounter for closed fracture: Secondary | ICD-10-CM | POA: Diagnosis not present

## 2014-12-07 DIAGNOSIS — R4182 Altered mental status, unspecified: Secondary | ICD-10-CM | POA: Diagnosis not present

## 2014-12-07 DIAGNOSIS — G459 Transient cerebral ischemic attack, unspecified: Secondary | ICD-10-CM | POA: Diagnosis not present

## 2014-12-07 DIAGNOSIS — S8992XA Unspecified injury of left lower leg, initial encounter: Secondary | ICD-10-CM | POA: Diagnosis not present

## 2014-12-07 DIAGNOSIS — M25462 Effusion, left knee: Secondary | ICD-10-CM | POA: Diagnosis not present

## 2014-12-08 DIAGNOSIS — I6523 Occlusion and stenosis of bilateral carotid arteries: Secondary | ICD-10-CM | POA: Diagnosis not present

## 2014-12-08 DIAGNOSIS — S8992XA Unspecified injury of left lower leg, initial encounter: Secondary | ICD-10-CM | POA: Diagnosis not present

## 2014-12-08 DIAGNOSIS — G459 Transient cerebral ischemic attack, unspecified: Secondary | ICD-10-CM | POA: Diagnosis not present

## 2014-12-09 DIAGNOSIS — G459 Transient cerebral ischemic attack, unspecified: Secondary | ICD-10-CM | POA: Diagnosis not present

## 2014-12-09 DIAGNOSIS — S8992XA Unspecified injury of left lower leg, initial encounter: Secondary | ICD-10-CM | POA: Diagnosis not present

## 2015-01-16 DIAGNOSIS — I2510 Atherosclerotic heart disease of native coronary artery without angina pectoris: Secondary | ICD-10-CM | POA: Diagnosis not present

## 2015-01-16 DIAGNOSIS — E039 Hypothyroidism, unspecified: Secondary | ICD-10-CM | POA: Diagnosis not present

## 2015-01-16 DIAGNOSIS — R51 Headache: Secondary | ICD-10-CM | POA: Diagnosis not present

## 2015-01-16 DIAGNOSIS — M150 Primary generalized (osteo)arthritis: Secondary | ICD-10-CM | POA: Diagnosis not present

## 2015-01-16 DIAGNOSIS — M2550 Pain in unspecified joint: Secondary | ICD-10-CM | POA: Diagnosis not present

## 2015-01-16 DIAGNOSIS — I10 Essential (primary) hypertension: Secondary | ICD-10-CM | POA: Diagnosis not present

## 2015-01-16 DIAGNOSIS — Z125 Encounter for screening for malignant neoplasm of prostate: Secondary | ICD-10-CM | POA: Diagnosis not present

## 2015-02-08 DIAGNOSIS — I10 Essential (primary) hypertension: Secondary | ICD-10-CM | POA: Diagnosis not present

## 2015-02-08 DIAGNOSIS — M150 Primary generalized (osteo)arthritis: Secondary | ICD-10-CM | POA: Diagnosis not present

## 2015-02-08 DIAGNOSIS — D638 Anemia in other chronic diseases classified elsewhere: Secondary | ICD-10-CM | POA: Diagnosis not present

## 2015-03-09 DIAGNOSIS — H401112 Primary open-angle glaucoma, right eye, moderate stage: Secondary | ICD-10-CM | POA: Diagnosis not present

## 2015-03-09 DIAGNOSIS — H401122 Primary open-angle glaucoma, left eye, moderate stage: Secondary | ICD-10-CM | POA: Diagnosis not present

## 2015-03-09 DIAGNOSIS — G453 Amaurosis fugax: Secondary | ICD-10-CM | POA: Diagnosis not present

## 2015-03-09 DIAGNOSIS — Z961 Presence of intraocular lens: Secondary | ICD-10-CM | POA: Diagnosis not present

## 2015-04-10 DIAGNOSIS — R609 Edema, unspecified: Secondary | ICD-10-CM | POA: Diagnosis not present

## 2015-04-10 DIAGNOSIS — I10 Essential (primary) hypertension: Secondary | ICD-10-CM | POA: Diagnosis not present

## 2015-04-10 DIAGNOSIS — M150 Primary generalized (osteo)arthritis: Secondary | ICD-10-CM | POA: Diagnosis not present

## 2015-04-10 DIAGNOSIS — D72819 Decreased white blood cell count, unspecified: Secondary | ICD-10-CM | POA: Diagnosis not present

## 2015-05-15 DIAGNOSIS — Z961 Presence of intraocular lens: Secondary | ICD-10-CM | POA: Diagnosis not present

## 2015-05-15 DIAGNOSIS — H401112 Primary open-angle glaucoma, right eye, moderate stage: Secondary | ICD-10-CM | POA: Diagnosis not present

## 2015-05-15 DIAGNOSIS — H1013 Acute atopic conjunctivitis, bilateral: Secondary | ICD-10-CM | POA: Diagnosis not present

## 2015-05-15 DIAGNOSIS — H401122 Primary open-angle glaucoma, left eye, moderate stage: Secondary | ICD-10-CM | POA: Diagnosis not present

## 2015-05-16 DIAGNOSIS — M109 Gout, unspecified: Secondary | ICD-10-CM | POA: Diagnosis not present

## 2015-05-16 DIAGNOSIS — G459 Transient cerebral ischemic attack, unspecified: Secondary | ICD-10-CM | POA: Diagnosis not present

## 2015-05-16 DIAGNOSIS — Z0001 Encounter for general adult medical examination with abnormal findings: Secondary | ICD-10-CM | POA: Diagnosis not present

## 2015-05-16 DIAGNOSIS — M150 Primary generalized (osteo)arthritis: Secondary | ICD-10-CM | POA: Diagnosis not present

## 2015-05-16 DIAGNOSIS — I2510 Atherosclerotic heart disease of native coronary artery without angina pectoris: Secondary | ICD-10-CM | POA: Diagnosis not present

## 2015-05-16 DIAGNOSIS — I10 Essential (primary) hypertension: Secondary | ICD-10-CM | POA: Diagnosis not present

## 2015-06-13 DIAGNOSIS — G453 Amaurosis fugax: Secondary | ICD-10-CM | POA: Diagnosis not present

## 2015-06-13 DIAGNOSIS — I10 Essential (primary) hypertension: Secondary | ICD-10-CM | POA: Diagnosis not present

## 2015-06-13 DIAGNOSIS — M150 Primary generalized (osteo)arthritis: Secondary | ICD-10-CM | POA: Diagnosis not present

## 2015-07-23 ENCOUNTER — Ambulatory Visit (HOSPITAL_COMMUNITY)
Admission: EM | Admit: 2015-07-23 | Discharge: 2015-07-23 | Disposition: A | Discharge status: Nursing Home (Includes ICF) | Payer: Medicare Other | Attending: Family Medicine | Admitting: Family Medicine

## 2015-07-23 ENCOUNTER — Emergency Department (HOSPITAL_COMMUNITY): Payer: Medicare Other

## 2015-07-23 ENCOUNTER — Encounter (HOSPITAL_COMMUNITY): Payer: Self-pay | Admitting: Emergency Medicine

## 2015-07-23 ENCOUNTER — Emergency Department (HOSPITAL_COMMUNITY)
Admission: EM | Admit: 2015-07-23 | Discharge: 2015-07-23 | Disposition: A | Discharge status: Home / Self Care | Payer: Medicare Other | Attending: Physician Assistant | Admitting: Physician Assistant

## 2015-07-23 ENCOUNTER — Encounter (HOSPITAL_COMMUNITY): Payer: Self-pay | Admitting: *Deleted

## 2015-07-23 DIAGNOSIS — I10 Essential (primary) hypertension: Secondary | ICD-10-CM | POA: Diagnosis not present

## 2015-07-23 DIAGNOSIS — Z87891 Personal history of nicotine dependence: Secondary | ICD-10-CM | POA: Insufficient documentation

## 2015-07-23 DIAGNOSIS — R5383 Other fatigue: Secondary | ICD-10-CM | POA: Diagnosis not present

## 2015-07-23 DIAGNOSIS — Z7982 Long term (current) use of aspirin: Secondary | ICD-10-CM | POA: Diagnosis not present

## 2015-07-23 DIAGNOSIS — G988 Other disorders of nervous system: Secondary | ICD-10-CM

## 2015-07-23 DIAGNOSIS — Z8673 Personal history of transient ischemic attack (TIA), and cerebral infarction without residual deficits: Secondary | ICD-10-CM | POA: Diagnosis not present

## 2015-07-23 DIAGNOSIS — G459 Transient cerebral ischemic attack, unspecified: Secondary | ICD-10-CM | POA: Diagnosis not present

## 2015-07-23 DIAGNOSIS — J439 Emphysema, unspecified: Secondary | ICD-10-CM | POA: Diagnosis not present

## 2015-07-23 DIAGNOSIS — R791 Abnormal coagulation profile: Secondary | ICD-10-CM | POA: Diagnosis not present

## 2015-07-23 DIAGNOSIS — R531 Weakness: Secondary | ICD-10-CM | POA: Diagnosis not present

## 2015-07-23 LAB — DIFFERENTIAL
Basophils Absolute: 0 10*3/uL (ref 0.0–0.1)
Basophils Relative: 0 %
EOS ABS: 0 10*3/uL (ref 0.0–0.7)
EOS PCT: 2 %
LYMPHS ABS: 1.5 10*3/uL (ref 0.7–4.0)
Lymphocytes Relative: 53 %
Monocytes Absolute: 0.2 10*3/uL (ref 0.1–1.0)
Monocytes Relative: 8 %
NEUTROS PCT: 37 %
Neutro Abs: 1 10*3/uL — ABNORMAL LOW (ref 1.7–7.7)

## 2015-07-23 LAB — URINALYSIS, ROUTINE W REFLEX MICROSCOPIC
Bilirubin Urine: NEGATIVE
GLUCOSE, UA: NEGATIVE mg/dL
HGB URINE DIPSTICK: NEGATIVE
Ketones, ur: NEGATIVE mg/dL
Leukocytes, UA: NEGATIVE
Nitrite: NEGATIVE
PROTEIN: NEGATIVE mg/dL
SPECIFIC GRAVITY, URINE: 1.014 (ref 1.005–1.030)
pH: 7 (ref 5.0–8.0)

## 2015-07-23 LAB — BASIC METABOLIC PANEL
ANION GAP: 7 (ref 5–15)
BUN: 15 mg/dL (ref 6–20)
CHLORIDE: 104 mmol/L (ref 101–111)
CO2: 26 mmol/L (ref 22–32)
CREATININE: 1.09 mg/dL (ref 0.61–1.24)
Calcium: 9.9 mg/dL (ref 8.9–10.3)
GFR calc non Af Amer: 58 mL/min — ABNORMAL LOW (ref >60)
Glucose, Bld: 93 mg/dL (ref 65–99)
Potassium: 4.4 mmol/L (ref 3.5–5.1)
SODIUM: 137 mmol/L (ref 135–145)

## 2015-07-23 LAB — PROTIME-INR
INR: 1.04 (ref 0.00–1.49)
PROTHROMBIN TIME: 13.8 s (ref 11.6–15.2)

## 2015-07-23 LAB — RAPID URINE DRUG SCREEN, HOSP PERFORMED
AMPHETAMINES: NOT DETECTED
BARBITURATES: NOT DETECTED
Benzodiazepines: NOT DETECTED
Cocaine: NOT DETECTED
Opiates: NOT DETECTED
TETRAHYDROCANNABINOL: NOT DETECTED

## 2015-07-23 LAB — CBC
HCT: 35.4 % — ABNORMAL LOW (ref 39.0–52.0)
HEMOGLOBIN: 11.4 g/dL — AB (ref 13.0–17.0)
MCH: 27.2 pg (ref 26.0–34.0)
MCHC: 32.2 g/dL (ref 30.0–36.0)
MCV: 84.5 fL (ref 78.0–100.0)
PLATELETS: 161 10*3/uL (ref 150–400)
RBC: 4.19 MIL/uL — AB (ref 4.22–5.81)
RDW: 14.1 % (ref 11.5–15.5)
WBC: 2.7 10*3/uL — AB (ref 4.0–10.5)

## 2015-07-23 LAB — ETHANOL: Alcohol, Ethyl (B): <5 mg/dL (ref <5)

## 2015-07-23 LAB — I-STAT TROPONIN, ED: Troponin i, poc: 0 ng/mL (ref 0.00–0.08)

## 2015-07-23 LAB — APTT: aPTT: 34 seconds (ref 24–37)

## 2015-07-23 LAB — CBG MONITORING, ED: GLUCOSE-CAPILLARY: 65 mg/dL (ref 65–99)

## 2015-07-23 NOTE — ED Notes (Signed)
Pt   Banded  With yellow  Fall risk  Precautions        And    Advised   To  Make sure he stays   In  Wheelchair

## 2015-07-23 NOTE — Discharge Instructions (Signed)
You were seen today for fatigue and concenr for a stroke. There was no evdience of an ACUTE stroke today.  Please follow up with your PCP in the upcoming days about the fatigue.

## 2015-07-23 NOTE — ED Notes (Signed)
Patient given a glass of orange juice for a CBG of 65.

## 2015-07-23 NOTE — ED Provider Notes (Signed)
CSN: [id number]     Arrival date & time 07/23/15  1743 History   First MD Initiated Contact with Patient 07/23/15 1826     Chief Complaint  Patient presents with  . Weakness  . Fatigue     (Consider location/radiation/quality/duration/timing/severity/associated sxs/prior Treatment) HPI   Patient is a 82 year old male with history of TIA and stroke on baby aspirin presenting today with increasing sleepiness at home. Patient's family states that he may has been dragging his leg more than usual for the last week. No other focal deficits. Patient was seen at urgent care and sent here for further evaluation.  \\  Patient denies any fever, shortness of breath, cough, urinary issues.   Past Medical History  Diagnosis Date  . Mental disorder     alzhiemers  . Dementia   . BPH (benign prostatic hyperplasia)   . Syncope     Cardionet 04/14/12  . Left ventricular diastolic dysfunction, NYHA class 1   . Cerebrovascular disease     Chronic ischemic microvascular white matter disease and central atrophy on CT scan  . Hypertension    Past Surgical History  Procedure Laterality Date  . Breast surgery      left tumor  . Pars plana vitrectomy  12/19/2010    Procedure: PARS PLANA VITRECTOMY WITH INTRAOCULAR LENS;  Surgeon: Shade FloodGreer Geiger, MD;  Location[facility] OR;  Service: Ophthalmology;  Laterality: Right;  PARS PLANA VITRECTOMY WITH INTRAOCULAR LENS [900]membrane peel,endolaser right eye   Family History  Problem Relation Age of Onset  . Cancer Sister    Social History  Substance Use Topics  . Smoking status: Former Smoker    Quit date: 01/15/1988  . Smokeless tobacco: None  . Alcohol Use: No    Review of Systems  Constitutional: Positive for fatigue. Negative for fever and activity change.  HENT: Negative for drooling and hearing loss.   Eyes: Negative for discharge and redness.  Respiratory: Negative for cough and shortness of breath.   Cardiovascular: Negative for chest pain.   Gastrointestinal: Negative for abdominal pain.  Genitourinary: Negative for dysuria and urgency.  Musculoskeletal: Negative for arthralgias.  Allergic/Immunologic: Negative for immunocompromised state.  Neurological: Positive for weakness. Negative for dizziness, seizures, speech difficulty and headaches.  Psychiatric/Behavioral: Negative for behavioral problems and agitation.  All other systems reviewed and are negative.     Allergies  Review of patient's allergies indicates no known allergies.  Home Medications   Prior to Admission medications   Medication Sig Start Date End Date Taking? Authorizing Provider  aspirin 81 MG tablet Take 81 mg by mouth daily.    Historical Provider, MD  Cholecalciferol (VITAMIN D PO) Take 1 tablet by mouth daily.    Historical Provider, MD  dorzolamide-timolol (COSOPT) 22.3-6.8 MG/ML ophthalmic solution 1 drop 2 (two) times daily.    Historical Provider, MD  feeding supplement, ENSURE COMPLETE, (ENSURE COMPLETE) LIQD Take 237 mLs by mouth 3 (three) times daily between meals. Patient not taking: Reported on 04/11/2014 10/25/13   Stephani PoliceMarianne L York, PA-C  lactose free nutrition (BOOST) LIQD Take 237 mLs by mouth daily.    Historical Provider, MD  latanoprost (XALATAN) 0.005 % ophthalmic solution Place 1 drop into both eyes at bedtime.    Historical Provider, MD  Multiple Vitamins-Minerals (CENTRUM PO) Take by mouth.    Historical Provider, MD   BP 177/93 mmHg  Pulse 60  Temp(Src) 97.6 F (36.4 C) (Oral)  Resp 20  SpO2 100% Physical Exam  Constitutional: He is  oriented to person, place, and time. He appears well-nourished.  HENT:  Head: Normocephalic.  Mouth/Throat: Oropharynx is clear and moist.  Eyes: Conjunctivae are normal.  Chronic changes to bilateral eyes.  Neck: No tracheal deviation present.  Cardiovascular: Normal rate.   Pulmonary/Chest: Effort normal. No stridor. No respiratory distress.  Abdominal: Soft. There is no tenderness. There  is no guarding.  Musculoskeletal: Normal range of motion. He exhibits no edema.  Neurological: He is oriented to person, place, and time. No cranial nerve deficit.  All 4extremities moving, normal strength. No neurologic deficit.  Skin: Skin is warm and dry. No rash noted. He is not diaphoretic.  Psychiatric: He has a normal mood and affect. His behavior is normal.  Nursing note and vitals reviewed.   ED Course  Procedures (including critical care time) Labs Review Labs Reviewed  BASIC METABOLIC PANEL  CBC  URINALYSIS, ROUTINE W REFLEX MICROSCOPIC (NOT AT [facility])  ETHANOL  PROTIME-INR  APTT  DIFFERENTIAL  URINE RAPID DRUG SCREEN, HOSP PERFORMED  CBG MONITORING, ED  I-STAT TROPOININ, ED    Imaging Review Dg Chest 2 View  07/23/2015  CLINICAL DATA:  Fatigue, lethargy, weakness and decreased appetite. EXAM: CHEST  2 VIEW COMPARISON:  10/29/2013 FINDINGS: The cardiac silhouette, mediastinal and hilar contours are normal and stable. Mild tortuosity and calcification of the thoracic aorta. There are chronic emphysematous changes. No acute overlying pulmonary process. Remote healed rib fractures are noted. IMPRESSION: Chronic emphysematous changes but no acute overlying pulmonary process. Electronically Signed   By: Rudie Meyer M.D.   On: 07/23/2015 19:33   I have personally reviewed and evaluated these images and lab results as part of my medical decision-making.   EKG Interpretation   Date/Time:  Sunday July 23 2015 18:20:11 EDT Ventricular Rate:  53 PR Interval:    QRS Duration: 94 QT Interval:  442 QTC Calculation: 415 R Axis:   82 Text Interpretation:  Sinus rhythm Borderline right axis deviation no  acute ischemia Confirmed by Kandis Mannan ([id number]) on 07/23/2015 7:55:37  PM      MDM   Final diagnoses:  None    Patient's very pleasant 82 year old male presenting with increasing sleepiness at home. Patient's family brought him here because he has been falling asleep  more than usual. They also report that he might have been having a changed gait in the last week. Has no other focal neurologic deficits. Patient sent here for stroke evaluation. We will get workup including chest x-ray and UA to look for infection causing increasing sleepiness. In addition we will get an MRI to assess for stroke that happened last week.  10:48 PM Workup completely negative. I do not find a source for patient's increasing sleepiness. Patient's vital signs remained normal. MRI shows no evidence of new stroke. We will discharge patient home and follow-up with primary care physician to further workup sleepiness at home.  Zaliyah Meikle Randall An, MD 07/23/15 2249

## 2015-07-23 NOTE — ED Notes (Signed)
Pt  Reports    Symptoms  Of   Weakness   As   Well as  Some decresed  Appetite    Unsteady  Gait    With   Symptoms   For  Almost  1  Week      Pt    Reports    Has  Had       A  History oF   TIA    In past     Pt  Ambulated  To  Room      Speech  Is  Slow  Yet     Steady

## 2015-07-23 NOTE — ED Provider Notes (Signed)
CSN: [id number]     Arrival date & time 07/23/15  1605 History   First MD Initiated Contact with Patient 07/23/15 1706     Chief Complaint  Patient presents with  . Weakness   (Consider location/radiation/quality/duration/timing/severity/associated sxs/prior Treatment) Patient is a 82 y.o. male presenting with weakness. The history is provided by the patient, the spouse and a relative.  Weakness This is a new problem. The current episode started more than 2 days ago (h/o TIA, now with weakness, xs sleepiness, poor appetite, unsteady gait. for approx 1 week.). The problem has been gradually worsening.    Past Medical History  Diagnosis Date  . Mental disorder     alzhiemers  . Hypertension   . Dementia   . BPH (benign prostatic hyperplasia)   . Syncope     Cardionet 04/14/12  . Left ventricular diastolic dysfunction, NYHA class 1   . Cerebrovascular disease     Chronic ischemic microvascular white matter disease and central atrophy on CT scan   Past Surgical History  Procedure Laterality Date  . Breast surgery      left tumor  . Pars plana vitrectomy  12/19/2010    Procedure: PARS PLANA VITRECTOMY WITH INTRAOCULAR LENS;  Surgeon: Shade FloodGreer Geiger, MD;  [facility];  Service: Ophthalmology;  Laterality: Right;  PARS PLANA VITRECTOMY WITH INTRAOCULAR LENS [900]membrane peel,endolaser right eye   Family History  Problem Relation Age of Onset  . Cancer Sister    Social History  Substance Use Topics  . Smoking status: Former Smoker    Quit date: 01/15/1988  . Smokeless tobacco: None  . Alcohol Use: No    Review of Systems  Constitutional: Positive for activity change, appetite change and fatigue. Negative for fever.  HENT: Negative.   Respiratory: Negative.   Cardiovascular: Negative.   Musculoskeletal: Positive for gait problem.  Skin: Negative.   Neurological: Positive for weakness.  All other systems reviewed and are negative.   Allergies  Review of patient's  allergies indicates no known allergies.  Home Medications   Prior to Admission medications   Medication Sig Start Date End Date Taking? Authorizing Provider  aspirin 81 MG tablet Take 81 mg by mouth daily.    Historical Provider, MD  Cholecalciferol (VITAMIN D PO) Take 1 tablet by mouth daily.    Historical Provider, MD  dorzolamide-timolol (COSOPT) 22.3-6.8 MG/ML ophthalmic solution 1 drop 2 (two) times daily.    Historical Provider, MD  feeding supplement, ENSURE COMPLETE, (ENSURE COMPLETE) LIQD Take 237 mLs by mouth 3 (three) times daily between meals. Patient not taking: Reported on 04/11/2014 10/25/13   Stephani PoliceMarianne L York, PA-C  lactose free nutrition (BOOST) LIQD Take 237 mLs by mouth daily.    Historical Provider, MD  latanoprost (XALATAN) 0.005 % ophthalmic solution Place 1 drop into both eyes at bedtime.    Historical Provider, MD  Multiple Vitamins-Minerals (CENTRUM PO) Take by mouth.    Historical Provider, MD   Meds Ordered and Administered this Visit  Medications - No data to display  BP 142/75 mmHg  Pulse 72  Temp(Src) 98.8 F (37.1 C) (Oral)  Resp 12  SpO2 100% No data found.   Physical Exam  Constitutional: He appears well-developed and well-nourished. He appears lethargic.  Neck: Normal range of motion. Neck supple.  Cardiovascular: Normal rate, normal heart sounds and intact distal pulses.   Pulmonary/Chest: Effort normal and breath sounds normal.  Abdominal: Soft. Bowel sounds are normal. He exhibits no mass. There is no  tenderness.  Lymphadenopathy:    He has no cervical adenopathy.  Neurological: He has normal strength. He appears lethargic.  Skin: Skin is warm and dry.  Nursing note and vitals reviewed.   ED Course  Procedures (including critical care time)  Labs Review Labs Reviewed - No data to display  Imaging Review No results found.   Visual Acuity Review  Right Eye Distance:   Left Eye Distance:   Bilateral Distance:    Right Eye Near:    Left Eye Near:    Bilateral Near:         MDM   1. Neurologic disorder    Sent at family request to eval for possible stroke.    Linna Hoff, MD 07/23/15 [phone]

## 2015-07-23 NOTE — ED Notes (Signed)
Patient able to ambulate independently  

## 2015-07-23 NOTE — ED Notes (Signed)
Pt presents to ED with wife.  Sts generalized weakness and "needing to sleep more" x 1-2 weeks.  Denies abdominal pain, chest pain, headache, dizziness, blurred vision, one-sided weakness, nausea/vomiting, and last BM was yesterday.  AxO at this time.

## 2015-07-23 NOTE — ED Notes (Signed)
Patient transported to X-ray 

## 2015-07-24 DIAGNOSIS — I10 Essential (primary) hypertension: Secondary | ICD-10-CM | POA: Diagnosis not present

## 2015-07-24 DIAGNOSIS — R1310 Dysphagia, unspecified: Secondary | ICD-10-CM | POA: Diagnosis not present

## 2015-07-24 DIAGNOSIS — G453 Amaurosis fugax: Secondary | ICD-10-CM | POA: Diagnosis not present

## 2015-08-14 DIAGNOSIS — G453 Amaurosis fugax: Secondary | ICD-10-CM | POA: Diagnosis not present

## 2015-08-14 DIAGNOSIS — M150 Primary generalized (osteo)arthritis: Secondary | ICD-10-CM | POA: Diagnosis not present

## 2015-08-14 DIAGNOSIS — I10 Essential (primary) hypertension: Secondary | ICD-10-CM | POA: Diagnosis not present

## 2015-10-16 DIAGNOSIS — M150 Primary generalized (osteo)arthritis: Secondary | ICD-10-CM | POA: Diagnosis not present

## 2015-10-16 DIAGNOSIS — G453 Amaurosis fugax: Secondary | ICD-10-CM | POA: Diagnosis not present

## 2015-10-16 DIAGNOSIS — I10 Essential (primary) hypertension: Secondary | ICD-10-CM | POA: Diagnosis not present

## 2015-11-27 DIAGNOSIS — I10 Essential (primary) hypertension: Secondary | ICD-10-CM | POA: Diagnosis not present

## 2015-11-27 DIAGNOSIS — M150 Primary generalized (osteo)arthritis: Secondary | ICD-10-CM | POA: Diagnosis not present

## 2015-11-27 DIAGNOSIS — G453 Amaurosis fugax: Secondary | ICD-10-CM | POA: Diagnosis not present

## 2015-12-21 DIAGNOSIS — Z961 Presence of intraocular lens: Secondary | ICD-10-CM | POA: Diagnosis not present

## 2015-12-21 DIAGNOSIS — H401132 Primary open-angle glaucoma, bilateral, moderate stage: Secondary | ICD-10-CM | POA: Diagnosis not present

## 2015-12-21 DIAGNOSIS — G453 Amaurosis fugax: Secondary | ICD-10-CM | POA: Diagnosis not present

## 2016-04-17 ENCOUNTER — Ambulatory Visit (HOSPITAL_COMMUNITY)
Admission: RE | Admit: 2016-04-17 | Discharge: 2016-04-17 | Disposition: A | Discharge status: Home / Self Care | Payer: Medicare Other | Source: Ambulatory Visit | Attending: Internal Medicine | Admitting: Internal Medicine

## 2016-04-17 ENCOUNTER — Other Ambulatory Visit: Payer: Self-pay | Admitting: Internal Medicine

## 2016-04-17 DIAGNOSIS — Z8709 Personal history of other diseases of the respiratory system: Secondary | ICD-10-CM

## 2016-04-17 DIAGNOSIS — J449 Chronic obstructive pulmonary disease, unspecified: Secondary | ICD-10-CM | POA: Insufficient documentation

## 2016-04-17 DIAGNOSIS — R05 Cough: Secondary | ICD-10-CM | POA: Diagnosis present

## 2016-09-25 IMAGING — CR DG CHEST 2V
2 series · 2 of 2 positions shown · non-contrast
Comparison: 10/29/2013

CLINICAL DATA: Fatigue, lethargy, weakness and decreased appetite.

EXAM:
CHEST  2 VIEW

[chest lat]
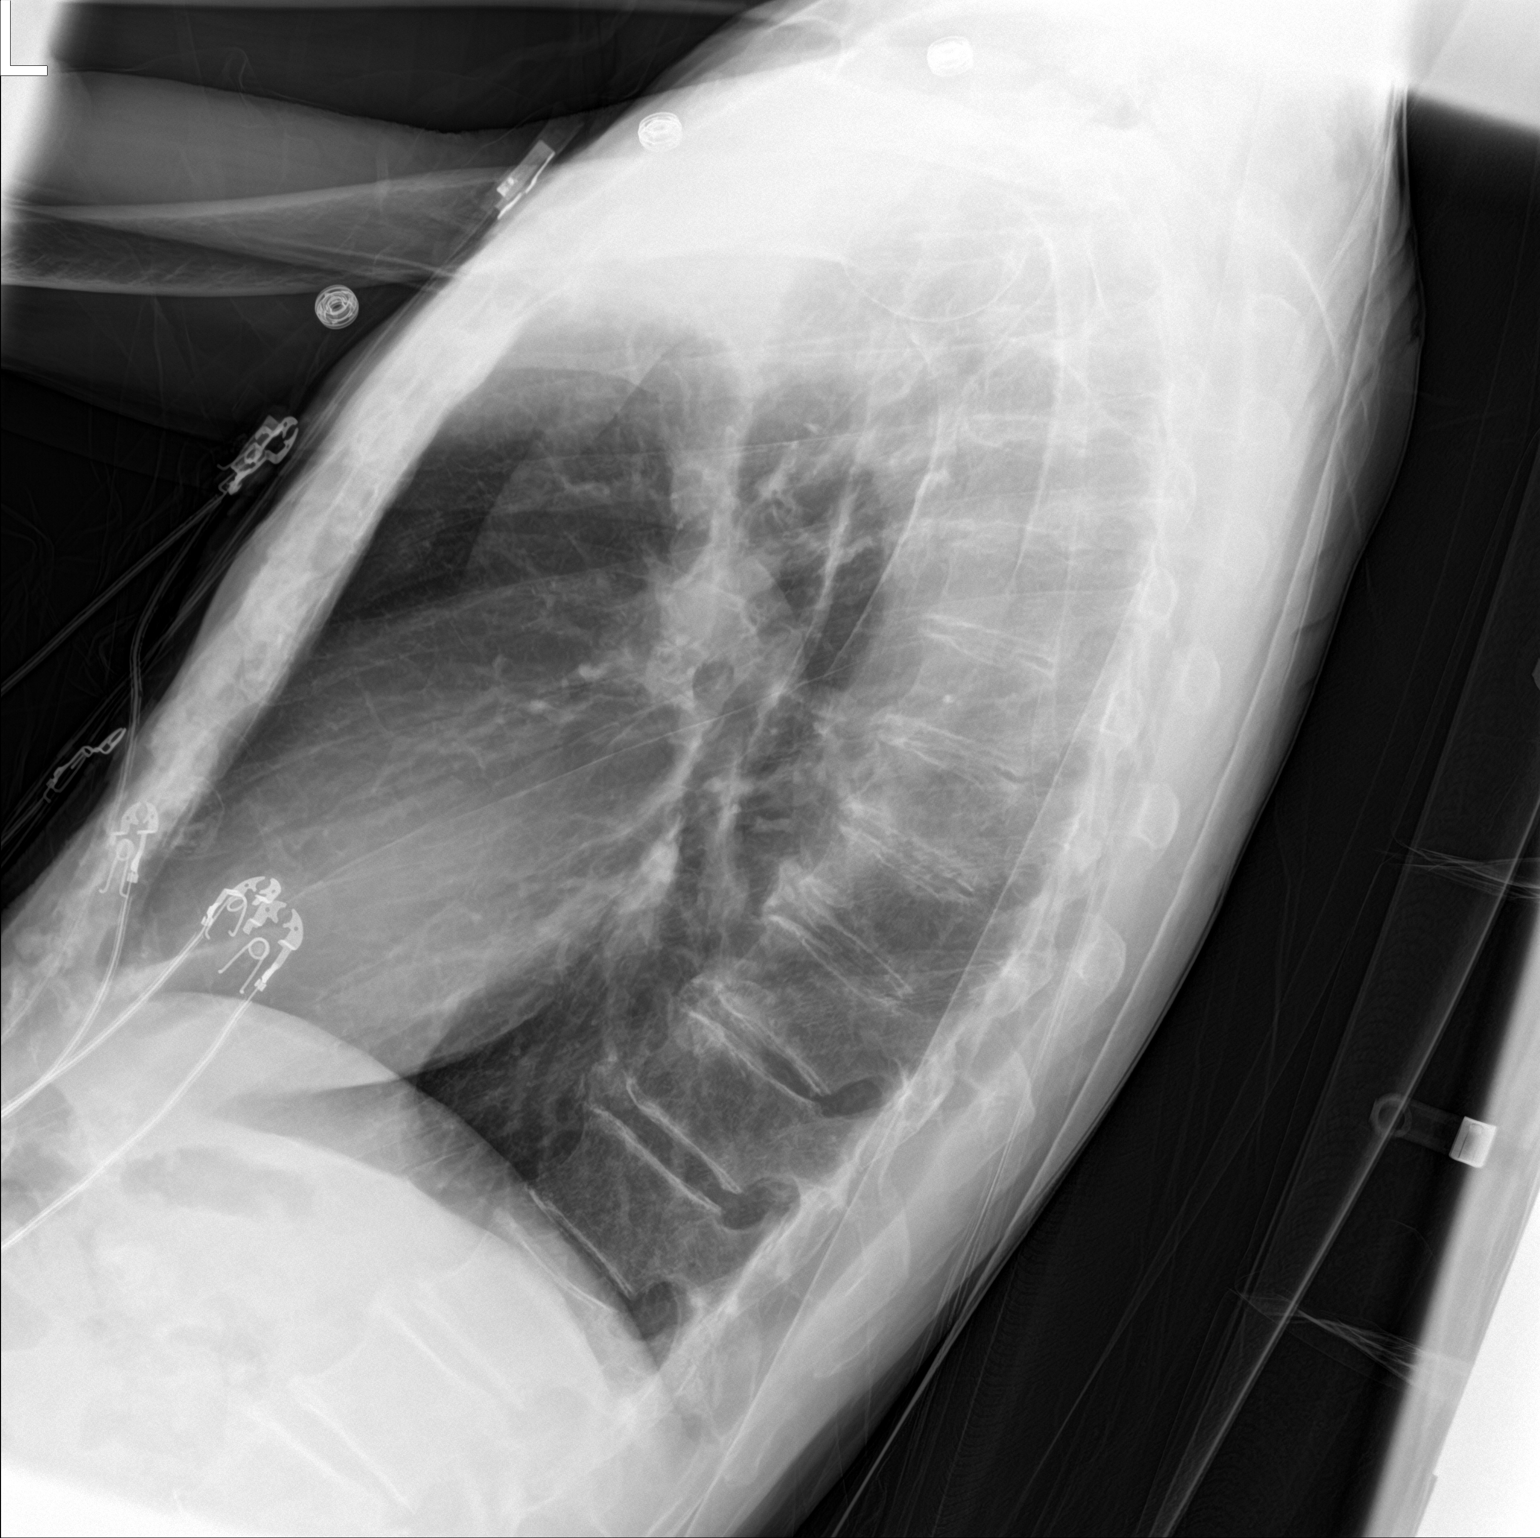

[chest ap]
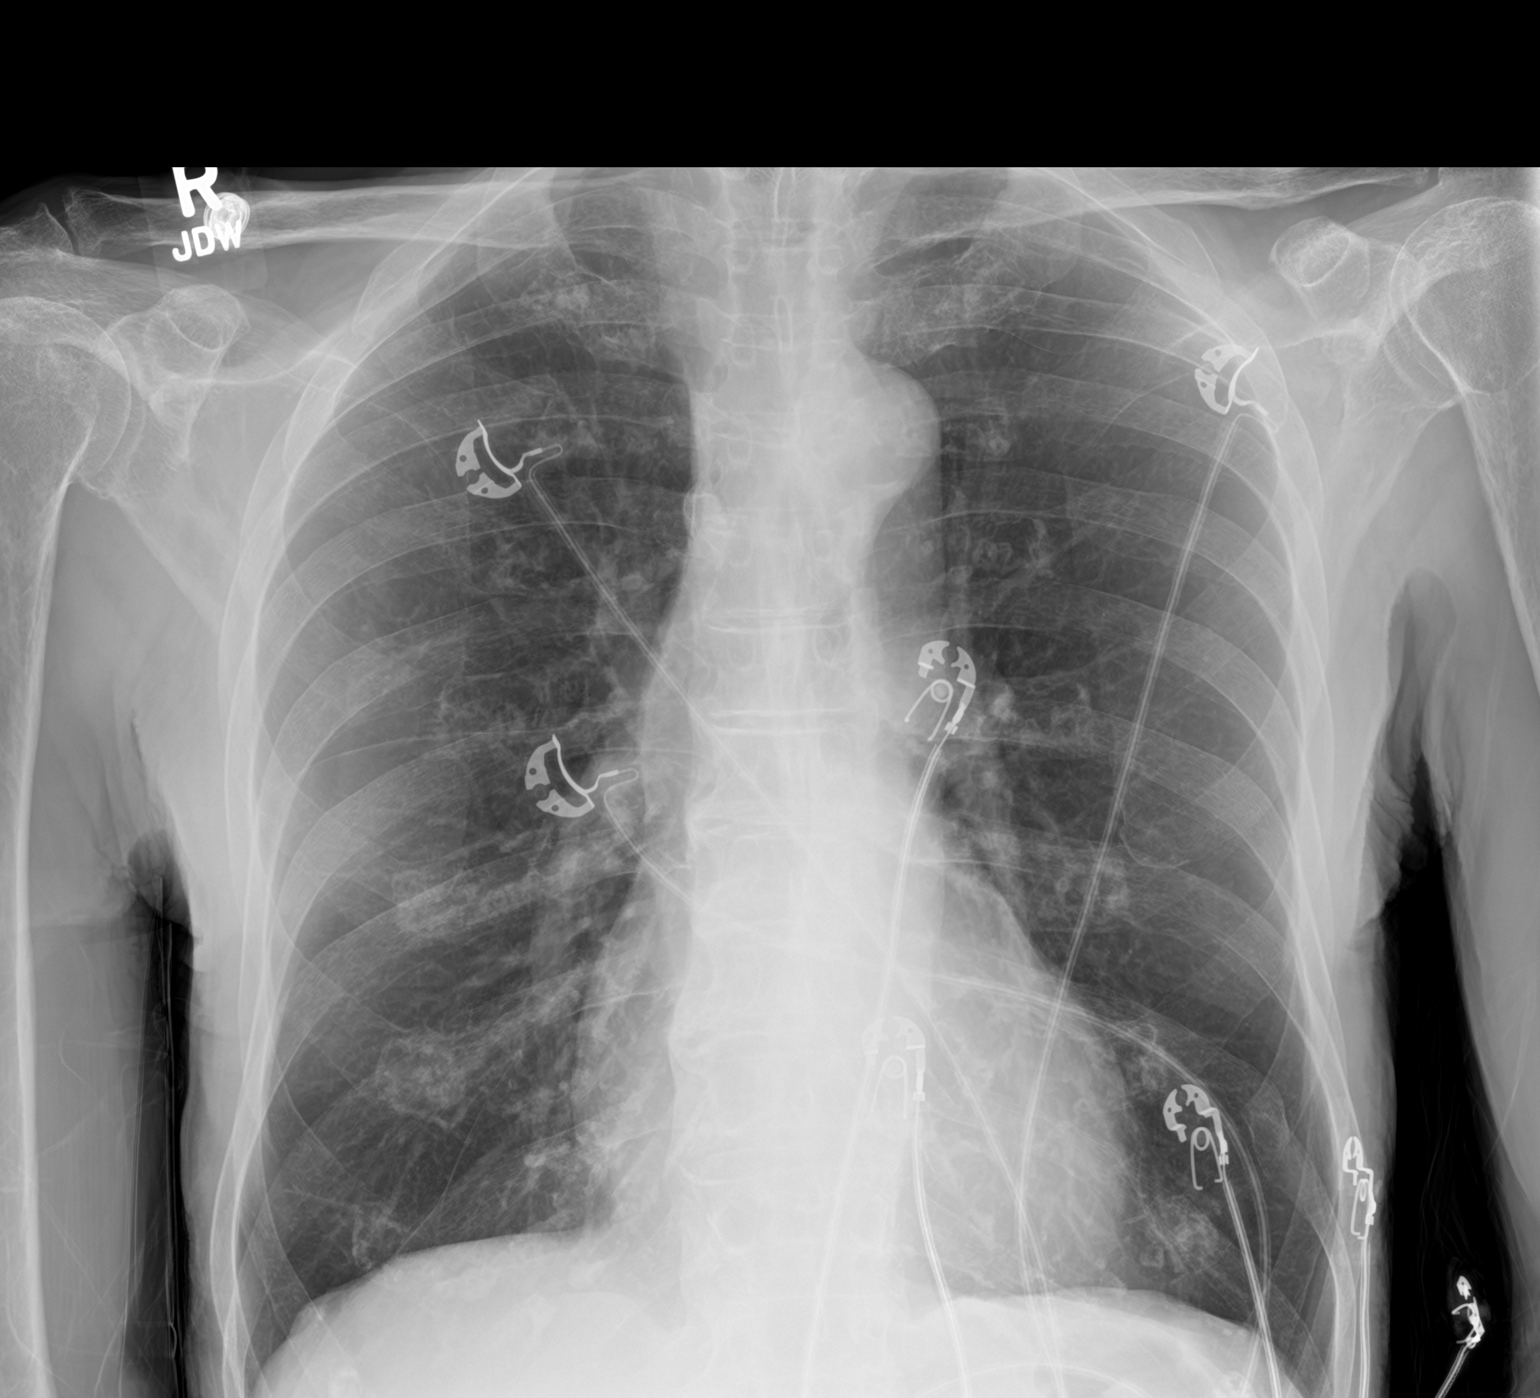

[2 of 2 positions shown; findings below may reference images not displayed]

FINDINGS: The cardiac silhouette, mediastinal and hilar contours are normal
and stable. Mild tortuosity and calcification of the thoracic aorta.
There are chronic emphysematous changes. No acute overlying
pulmonary process. Remote healed rib fractures are noted.
IMPRESSION: Chronic emphysematous changes but no acute overlying pulmonary
process.

## 2016-09-25 IMAGING — MR MR HEAD W/O CM
9 of 10 series · 35 of 48 positions shown · non-contrast
Comparison: MRI of the head April 25, 2014

CLINICAL DATA: Increasing sleepiness at home, worsening leg
dragging for 1 week. History of TIA, dementia, hypertension.

EXAM:
MRI HEAD WITHOUT CONTRAST
TECHNIQUE: Multiplanar, multiecho pulse sequences of the brain and surrounding
structures were obtained without intravenous contrast.

[Series 3: T1 · sagittal · 5.0mm · 0.47mm/px · 3 of 25 slices shown]
[im 1/25]
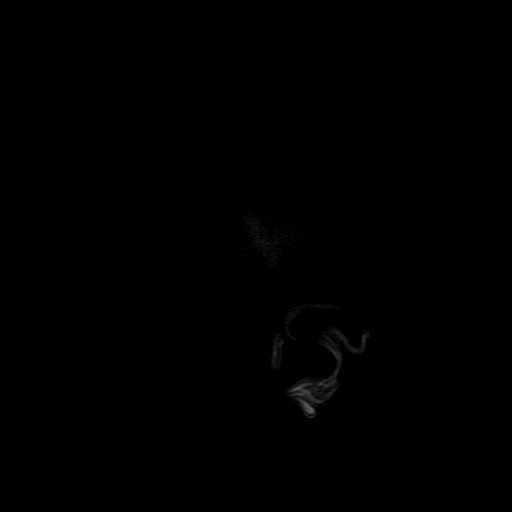
[im 13/25]
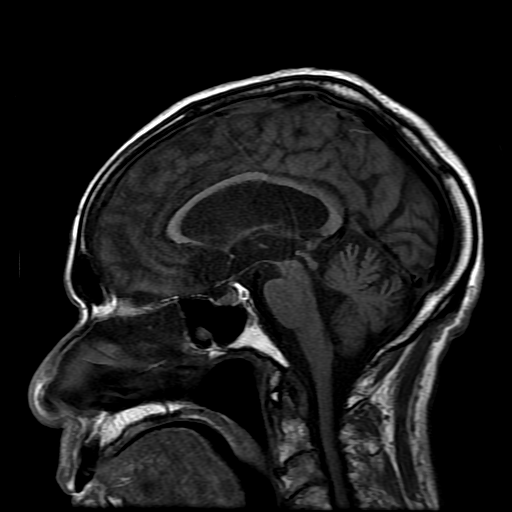
[im 25/25]
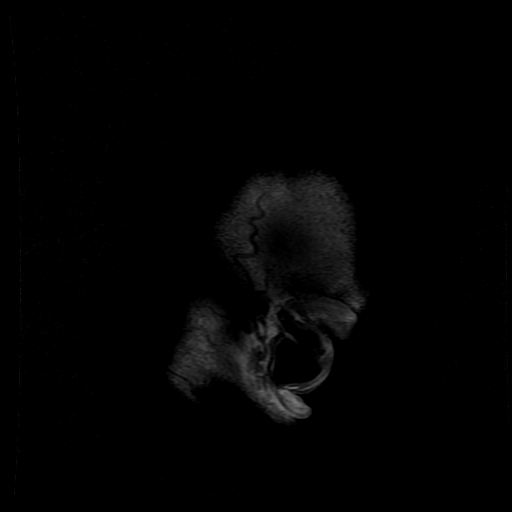

[Series 6: FLAIR · axial · 5.0mm · 0.43mm/px · z∈[-57,+86]mm · 2 of 25 slices shown]
[im 1/25]
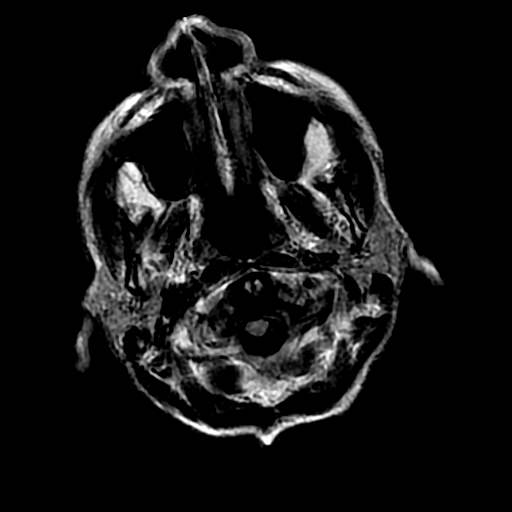
[im 25/25]
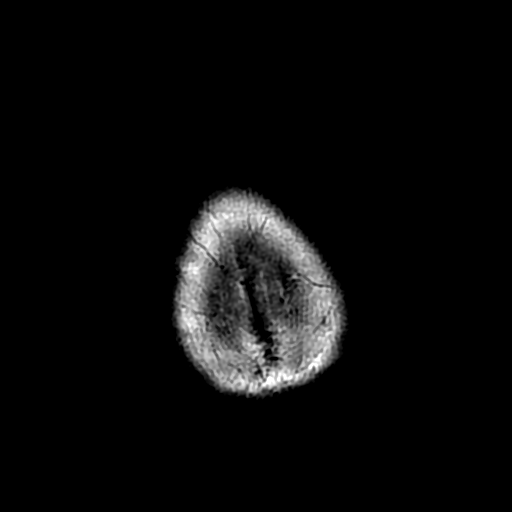

[Series 7: ax mpgr · axial · 5.0mm · 0.45mm/px · 1 of 25 slices shown]
[im 1/25]
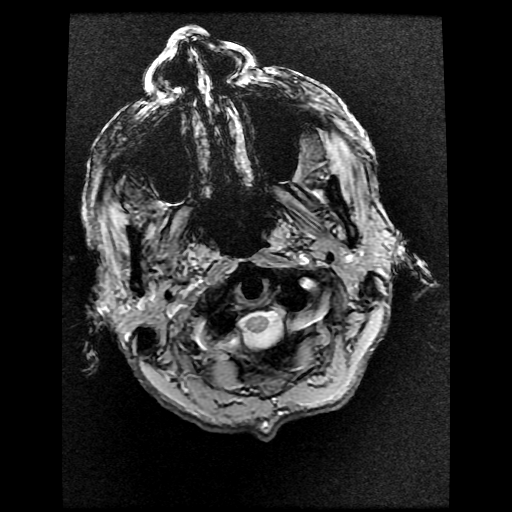

[Series 9: T2 · coronal · 5.0mm · 0.43mm/px · 3 of 30 slices shown (1 of 2)]
[im 1/30]
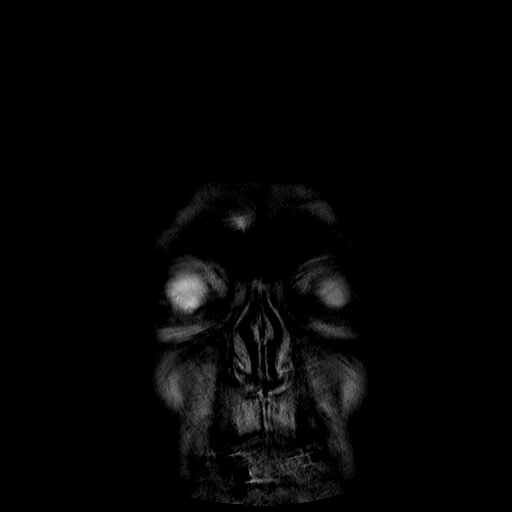
[im 15/30]
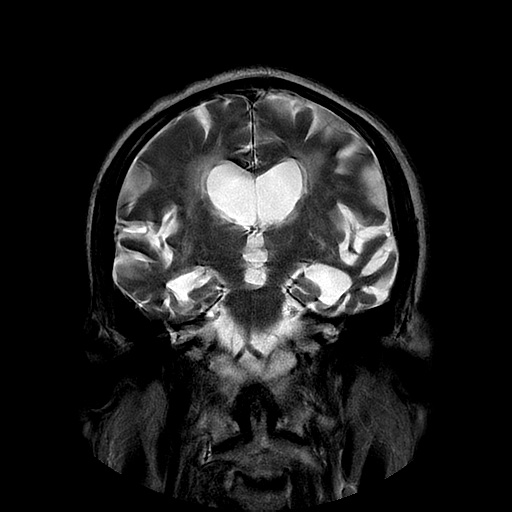
[im 30/30]
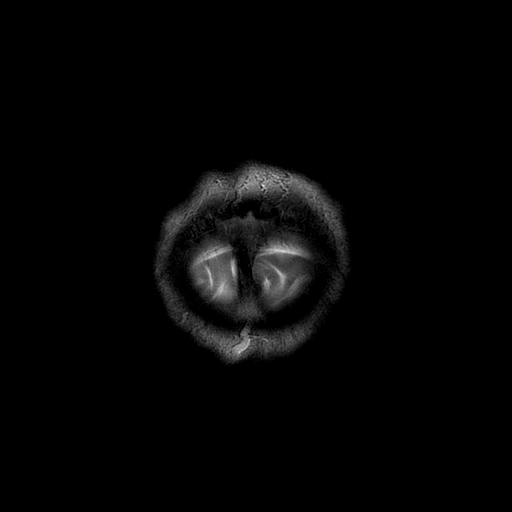

[Series 10: T2 · axial · 5.0mm · 0.43mm/px · z∈[-57,+86]mm · 2 of 25 slices shown (2 of 2)]
[im 1/25]
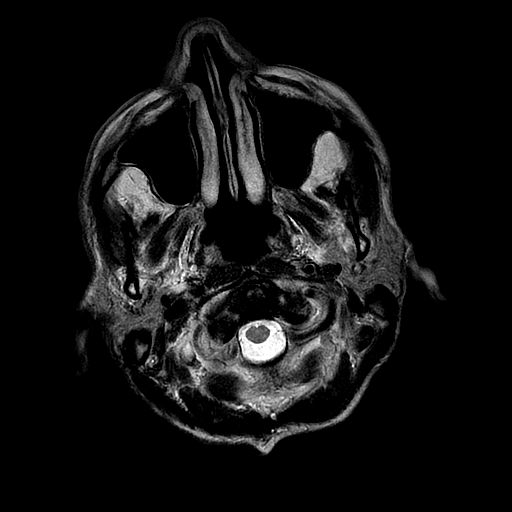
[im 25/25]
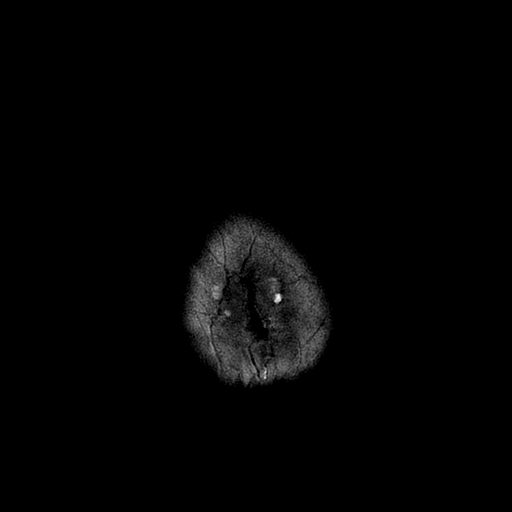

[Series 11: DWI · coronal · 5.0mm · 0.98mm/px · 7 of 72 slices shown (1 of 4)]
[im 1/72]
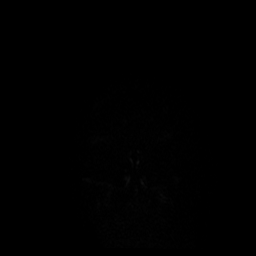
[im 12/72]
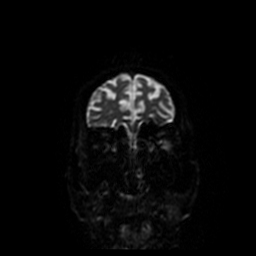
[im 24/72]
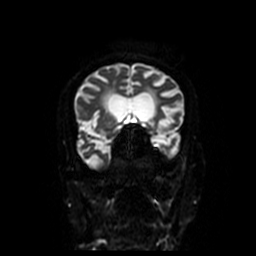
[im 36/72]
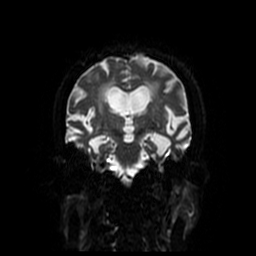
[im 48/72]
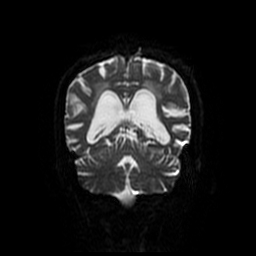
[im 60/72]
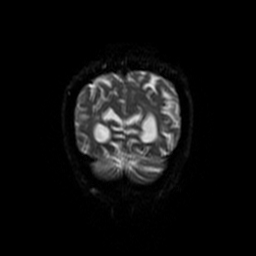
[im 72/72]
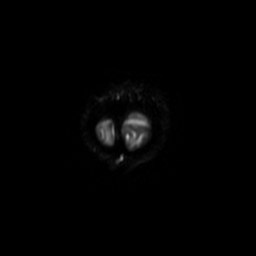

[Series 12: DWI · axial · 3.0mm · 0.98mm/px · z∈[-59,+84]mm · 8 of 98 slices shown (2 of 4)]
[im 1/98]
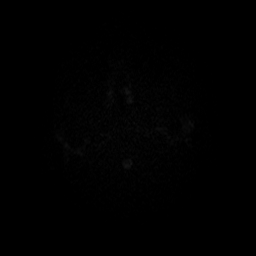
[im 11/98]
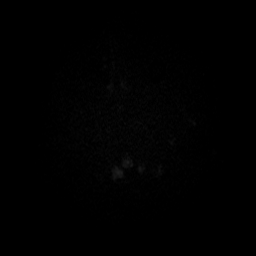
[im 33/98]
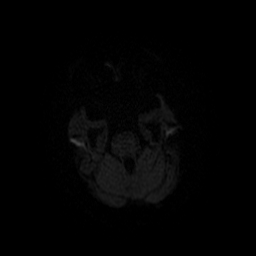
[im 44/98]
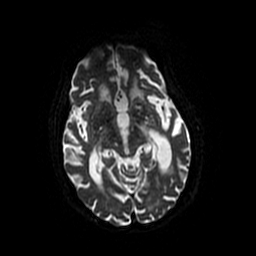
[im 54/98]
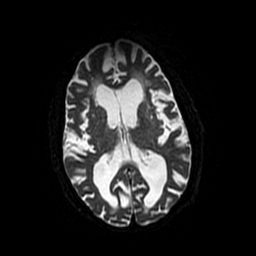
[im 65/98]
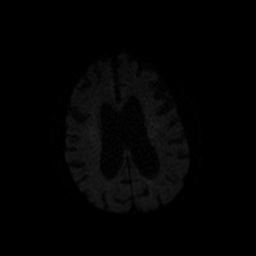
[im 87/98]
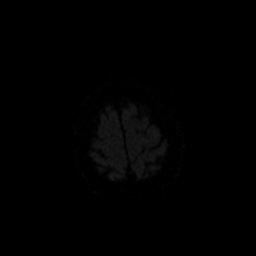
[im 98/98]
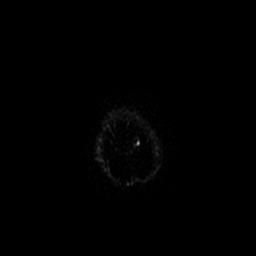

[Series 1100: DWI · coronal · 5.0mm · 0.98mm/px · 4 of 36 slices shown (3 of 4)]
[im 1/36]
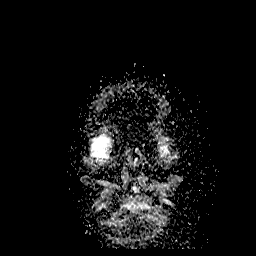
[im 12/36]
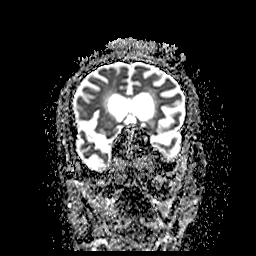
[im 24/36]
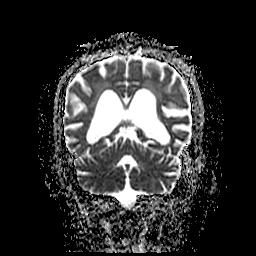
[im 36/36]
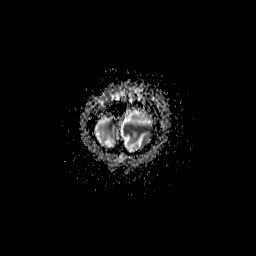

[Series 1200: DWI · axial · 3.0mm · 0.98mm/px · z∈[-59,+84]mm · 5 of 49 slices shown (4 of 4)]
[im 1/49]
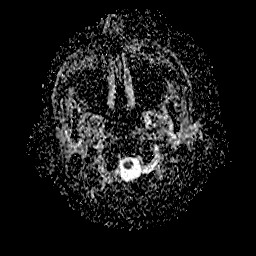
[im 13/49]
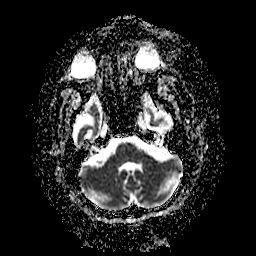
[im 25/49]
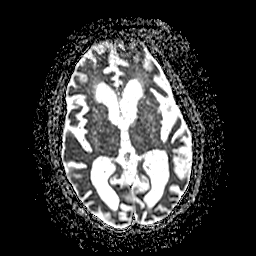
[im 37/49]
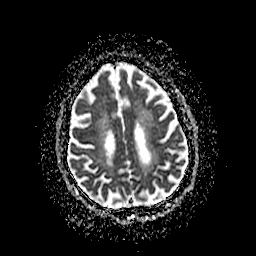
[im 49/49]
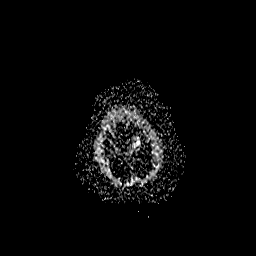

[35 of 48 positions shown; findings below may reference images not displayed]

FINDINGS: INTRACRANIAL CONTENTS: No reduced diffusion to suggest acute
ischemia. No susceptibility artifact to suggest hemorrhage. Stable
moderate to severe ventriculomegaly on the basis of global
parenchymal brain volume loss. Old LEFT basal ganglia lacunar
infarct. Patchy to confluent supratentorial white matter FLAIR T2
hyperintensities. No suspicious parenchymal signal, masses or mass
effect. No abnormal extra-axial fluid collections. No extra-axial
masses though, contrast enhanced sequences would be more sensitive.
Normal major intracranial vascular flow voids present at skull base.

ORBITS: The included ocular globes and orbital contents are
non-suspicious. Status post bilateral ocular lens implant.

SINUSES: The mastoid air-cells and included paranasal sinuses are
well-aerated.

SKULL/SOFT TISSUES: No abnormal sellar expansion. No suspicious
calvarial bone marrow signal. Craniocervical junction maintained.
IMPRESSION: No acute intracranial process, specifically no acute ischemia.

Stable chronic changes including moderate to severe global brain
atrophy, old LEFT basal ganglia infarct and moderate to severe
chronic small vessel ischemic disease.

## 2017-02-17 ENCOUNTER — Other Ambulatory Visit: Payer: Self-pay

## 2017-02-17 ENCOUNTER — Ambulatory Visit (HOSPITAL_COMMUNITY)
Admission: EM | Admit: 2017-02-17 | Discharge: 2017-02-17 | Disposition: A | Discharge status: Home / Self Care | Payer: Medicare Other | Attending: Family Medicine | Admitting: Family Medicine

## 2017-02-17 ENCOUNTER — Ambulatory Visit (INDEPENDENT_AMBULATORY_CARE_PROVIDER_SITE_OTHER): Payer: Medicare Other

## 2017-02-17 ENCOUNTER — Encounter (HOSPITAL_COMMUNITY): Payer: Self-pay | Admitting: Emergency Medicine

## 2017-02-17 DIAGNOSIS — R1010 Upper abdominal pain, unspecified: Secondary | ICD-10-CM

## 2017-02-17 NOTE — ED Triage Notes (Signed)
Pt states hes had off and on abdominal pain for a couple weeks, denies pain at this time. States last pain was this morning.

## 2017-02-17 NOTE — ED Provider Notes (Signed)
[facility]   [id number] 02/17/17 Arrival Time: 1125   SUBJECTIVE:  Cesar Maldonado is a 84 y.o. male who presents to the urgent care with complaint of abdominal pain.  The history is somewhat vague because the patient's mental status is subpar.  Family with him (daughter and wife) state that they are uncertain whether he has had any constipation or diarrhea.  Apparently the pain comes and goes and short episodes are brief.  He had some vomiting 2 weeks ago but none since.   Past Medical History:  Diagnosis Date  . BPH (benign prostatic hyperplasia)   . 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 Dementia   . Hypertension   . Left ventricular diastolic dysfunction, NYHA class 1   . Mental disorder    alzhiemers  . Syncope    Cardionet 04/14/12   Family History  Problem Relation Age of Onset  . Cancer Sister    Social History   Socioeconomic History  . Marital status: Married    Spouse name: [name]  . Number of children: 4  . Years of education: 66  . Highest education level: Not on file  Social Needs  . Financial resource strain: Not on file  . Food insecurity - worry: Not on file  . Food insecurity - inability: Not on file  . Transportation needs - medical: Not on file  . Transportation needs - non-medical: Not on file  Occupational History  . Occupation: Retired, plaster work  Tobacco Use  . Smoking status: Former Smoker    Last attempt to quit: 01/15/1988    Years since quitting: 29.1  Substance and Sexual Activity  . Alcohol use: No    Alcohol/week: 1.2 oz    Types: 2 Shots of liquor per week  . Drug use: No  . Sexual activity: Not on file  Other Topics Concern  . Not on file  Social History Narrative   Married.  Lives with wife.  Ambulates independently.   Caffeine use: Drinks coffee 1 cup per day at most   Drinks 1/2 can soda per day    Current Meds  Medication Sig  .  diltiazem (DILACOR XR) 120 MG 24 hr capsule Take 120 mg by mouth daily.  . dorzolamide-timolol (COSOPT) 22.3-6.8 MG/ML ophthalmic solution Place 1 drop into both eyes 2 (two) times daily.   Marland Kitchen latanoprost (XALATAN) 0.005 % ophthalmic solution Place 1 drop into both eyes at bedtime.   No Known Allergies    ROS: As per HPI, remainder of ROS negative.   OBJECTIVE:   Vitals:   02/17/17 1258  BP: (!) 141/78  Pulse: 80  Resp: 18  Temp: 98.4 F (36.9 C)  SpO2: 100%     General appearance: alert; no distress Eyes: PERRL; EOMI; conjunctiva normal HENT: normocephalic; atraumatic; TMs normal, canal normal, external ears normal without trauma; nasal mucosa normal; oral mucosa normal Neck: supple Lungs: clear to auscultation bilaterally Heart: regular rate and rhythm Abdomen: soft, non-tender; bowel sounds normal; no masses or organomegaly; no guarding or rebound tenderness Back: no CVA tenderness Extremities: no cyanosis or edema; symmetrical with no gross deformities Skin: warm and dry Neurologic: normal gait; grossly normal Psychological: alert and cooperative; normal mood and affect   COMPARISON:  MRI of the head April 25, 2014  FINDINGS: INTRACRANIAL CONTENTS: No reduced diffusion to suggest acute ischemia. No susceptibility artifact to suggest hemorrhage. Stable moderate to severe ventriculomegaly  on the basis of global parenchymal brain volume loss. Old LEFT basal ganglia lacunar infarct. Patchy to confluent supratentorial white matter FLAIR T2 hyperintensities. No suspicious parenchymal signal, masses or mass effect. No abnormal extra-axial fluid collections. No extra-axial masses though, contrast enhanced sequences would be more sensitive. Normal major intracranial vascular flow voids present at skull base.  ORBITS: The included ocular globes and orbital contents are non-suspicious. Status post bilateral ocular lens implant.  SINUSES: The mastoid air-cells and  included paranasal sinuses are well-aerated.  SKULL/SOFT TISSUES: No abnormal sellar expansion. No suspicious calvarial bone marrow signal. Craniocervical junction maintained.  IMPRESSION: No acute intracranial process, specifically no acute ischemia.  Stable chronic changes including moderate to severe global brain atrophy, old LEFT basal ganglia infarct and moderate to severe chronic small vessel ischemic disease.   Electronically Signed   By: Awilda Metro M.D.   On: 07/23/2015 22:16   Labs:  Results for orders placed or performed during the hospital encounter of 07/23/15  Basic metabolic panel  Result Value Ref Range   Sodium 137 135 - 145 mmol/L   Potassium 4.4 3.5 - 5.1 mmol/L   Chloride 104 101 - 111 mmol/L   CO2 26 22 - 32 mmol/L   Glucose, Bld 93 65 - 99 mg/dL   BUN 15 6 - 20 mg/dL   Creatinine, Ser 1.61 0.61 - 1.24 mg/dL   Calcium 9.9 8.9 - 09.6 mg/dL   GFR calc non Af Amer 58 (L) >60 mL/min   GFR calc Af Amer >60 >60 mL/min   Anion gap 7 5 - 15  CBC  Result Value Ref Range   WBC 2.7 (L) 4.0 - 10.5 K/uL   RBC 4.19 (L) 4.22 - 5.81 MIL/uL   Hemoglobin 11.4 (L) 13.0 - 17.0 g/dL   HCT 04.5 (L) 40.9 - 81.1 %   MCV 84.5 78.0 - 100.0 fL   MCH 27.2 26.0 - 34.0 pg   MCHC 32.2 30.0 - 36.0 g/dL   RDW 91.4 78.2 - 95.6 %   Platelets 161 150 - 400 K/uL  Urinalysis, Routine w reflex microscopic  Result Value Ref Range   Color, Urine YELLOW YELLOW   APPearance CLEAR CLEAR   Specific Gravity, Urine 1.014 1.005 - 1.030   pH 7.0 5.0 - 8.0   Glucose, UA NEGATIVE NEGATIVE mg/dL   Hgb urine dipstick NEGATIVE NEGATIVE   Bilirubin Urine NEGATIVE NEGATIVE   Ketones, ur NEGATIVE NEGATIVE mg/dL   Protein, ur NEGATIVE NEGATIVE mg/dL   Nitrite NEGATIVE NEGATIVE   Leukocytes, UA NEGATIVE NEGATIVE  Ethanol  Result Value Ref Range   Alcohol, Ethyl (B) <5 <5 mg/dL  Protime-INR  Result Value Ref Range   Prothrombin Time 13.8 11.6 - 15.2 seconds   INR 1.04 0.00 - 1.49   APTT  Result Value Ref Range   aPTT 34 24 - 37 seconds  Urine rapid drug screen (hosp performed)not at [facility]  Result Value Ref Range   Opiates NONE DETECTED NONE DETECTED   Cocaine NONE DETECTED NONE DETECTED   Benzodiazepines NONE DETECTED NONE DETECTED   Amphetamines NONE DETECTED NONE DETECTED   Tetrahydrocannabinol NONE DETECTED NONE DETECTED   Barbiturates NONE DETECTED NONE DETECTED  Differential  Result Value Ref Range   Neutrophils Relative % 37 %   Neutro Abs 1.0 (L) 1.7 - 7.7 K/uL   Lymphocytes Relative 53 %   Lymphs Abs 1.5 0.7 - 4.0 K/uL   Monocytes Relative 8 %   Monocytes Absolute 0.2 0.1 -  1.0 K/uL   Eosinophils Relative 2 %   Eosinophils Absolute 0.0 0.0 - 0.7 K/uL   Basophils Relative 0 %   Basophils Absolute 0.0 0.0 - 0.1 K/uL  CBG monitoring, ED  Result Value Ref Range   Glucose-Capillary 65 65 - 99 mg/dL  I-stat troponin, ED ([facility])  Result Value Ref Range   Troponin i, poc 0.00 0.00 - 0.08 ng/mL   Comment 3            Labs Reviewed - No data to display  Dg Abd 1 View  Result Date: 02/17/2017 CLINICAL DATA:  Periumbilical abdominal pain and cramping for the past 2 weeks. EXAM: ABDOMEN - 1 VIEW COMPARISON:  None. FINDINGS: The bowel gas pattern is normal. No radio-opaque calculi or other significant radiographic abnormality are seen. IMPRESSION: Negative. Electronically Signed   By: Obie DredgeWilliam T Derry M.D.   On: 02/17/2017 13:30       ASSESSMENT & PLAN:  1. Pain of upper abdomen     No orders of the defined types were placed in this encounter.   Reviewed expectations re: course of current medical issues. Questions answered. Outlined signs and symptoms indicating need for more acute intervention. Patient verbalized understanding. After Visit Summary given.    Procedures:      Elvina SidleLauenstein, Quentyn Kolbeck, MD 02/17/17 1341

## 2017-02-17 NOTE — Discharge Instructions (Signed)
The x-rays do not show any abnormality.  Since the pain is intermittent and brief, I do not think you need to take any medicine at this time.  Instead, apply heating pad if the pain goes on for more than a few minutes.  You can also try a dose of Mylanta if the pain returns.  Schedule an appointment with your doctor 1 week and keep a diary of all the episodes of abdominal pain between now and then.  Review this with Dr. Allyne GeeSanders.

## 2017-02-23 ENCOUNTER — Encounter (HOSPITAL_COMMUNITY): Payer: Self-pay | Admitting: *Deleted

## 2017-02-23 ENCOUNTER — Emergency Department (HOSPITAL_COMMUNITY): Payer: Medicare Other

## 2017-02-23 ENCOUNTER — Emergency Department (HOSPITAL_COMMUNITY)
Admission: EM | Admit: 2017-02-23 | Discharge: 2017-02-23 | Disposition: A | Discharge status: Home / Self Care | Payer: Medicare Other | Attending: Emergency Medicine | Admitting: Emergency Medicine

## 2017-02-23 ENCOUNTER — Other Ambulatory Visit: Payer: Self-pay

## 2017-02-23 DIAGNOSIS — E860 Dehydration: Secondary | ICD-10-CM | POA: Insufficient documentation

## 2017-02-23 DIAGNOSIS — I129 Hypertensive chronic kidney disease with stage 1 through stage 4 chronic kidney disease, or unspecified chronic kidney disease: Secondary | ICD-10-CM | POA: Insufficient documentation

## 2017-02-23 DIAGNOSIS — R531 Weakness: Secondary | ICD-10-CM | POA: Insufficient documentation

## 2017-02-23 DIAGNOSIS — Z87891 Personal history of nicotine dependence: Secondary | ICD-10-CM | POA: Insufficient documentation

## 2017-02-23 DIAGNOSIS — R630 Anorexia: Secondary | ICD-10-CM | POA: Diagnosis present

## 2017-02-23 DIAGNOSIS — Z79899 Other long term (current) drug therapy: Secondary | ICD-10-CM | POA: Insufficient documentation

## 2017-02-23 DIAGNOSIS — G309 Alzheimer's disease, unspecified: Secondary | ICD-10-CM | POA: Diagnosis not present

## 2017-02-23 DIAGNOSIS — N183 Chronic kidney disease, stage 3 (moderate): Secondary | ICD-10-CM | POA: Diagnosis not present

## 2017-02-23 LAB — BASIC METABOLIC PANEL
Anion gap: 9 (ref 5–15)
BUN: 15 mg/dL (ref 6–20)
CALCIUM: 9.2 mg/dL (ref 8.9–10.3)
CO2: 26 mmol/L (ref 22–32)
CREATININE: 1.04 mg/dL (ref 0.61–1.24)
Chloride: 95 mmol/L — ABNORMAL LOW (ref 101–111)
GFR calc Af Amer: >60 mL/min (ref >60)
GFR calc non Af Amer: >60 mL/min (ref >60)
GLUCOSE: 95 mg/dL (ref 65–99)
Potassium: 4.2 mmol/L (ref 3.5–5.1)
Sodium: 130 mmol/L — ABNORMAL LOW (ref 135–145)

## 2017-02-23 LAB — CBC WITH DIFFERENTIAL/PLATELET
BASOS PCT: 0 %
Basophils Absolute: 0 10*3/uL (ref 0.0–0.1)
EOS ABS: 0.3 10*3/uL (ref 0.0–0.7)
Eosinophils Relative: 6 %
HEMATOCRIT: 29.4 % — AB (ref 39.0–52.0)
Hemoglobin: 9.6 g/dL — ABNORMAL LOW (ref 13.0–17.0)
Lymphocytes Relative: 11 %
Lymphs Abs: 0.6 10*3/uL — ABNORMAL LOW (ref 0.7–4.0)
MCH: 26.6 pg (ref 26.0–34.0)
MCHC: 32.7 g/dL (ref 30.0–36.0)
MCV: 81.4 fL (ref 78.0–100.0)
MONO ABS: 0.8 10*3/uL (ref 0.1–1.0)
MONOS PCT: 15 %
Neutro Abs: 3.7 10*3/uL (ref 1.7–7.7)
Neutrophils Relative %: 68 %
Platelets: 88 10*3/uL — ABNORMAL LOW (ref 150–400)
RBC: 3.61 MIL/uL — ABNORMAL LOW (ref 4.22–5.81)
RDW: 13.5 % (ref 11.5–15.5)
WBC: 5.4 10*3/uL (ref 4.0–10.5)

## 2017-02-23 LAB — HEPATIC FUNCTION PANEL
ALT: 12 U/L — ABNORMAL LOW (ref 17–63)
AST: 45 U/L — ABNORMAL HIGH (ref 15–41)
Albumin: 3.3 g/dL — ABNORMAL LOW (ref 3.5–5.0)
Alkaline Phosphatase: 91 U/L (ref 38–126)
BILIRUBIN TOTAL: 0.4 mg/dL (ref 0.3–1.2)
Bilirubin, Direct: <0.1 mg/dL — ABNORMAL LOW (ref 0.1–0.5)
Total Protein: 7.4 g/dL (ref 6.5–8.1)

## 2017-02-23 LAB — URINALYSIS, ROUTINE W REFLEX MICROSCOPIC
BILIRUBIN URINE: NEGATIVE
GLUCOSE, UA: NEGATIVE mg/dL
Hgb urine dipstick: NEGATIVE
KETONES UR: NEGATIVE mg/dL
Leukocytes, UA: NEGATIVE
Nitrite: NEGATIVE
Protein, ur: NEGATIVE mg/dL
Specific Gravity, Urine: 1.014 (ref 1.005–1.030)
pH: 6 (ref 5.0–8.0)

## 2017-02-23 LAB — I-STAT TROPONIN, ED: TROPONIN I, POC: 0.01 ng/mL (ref 0.00–0.08)

## 2017-02-23 LAB — CBG MONITORING, ED: Glucose-Capillary: 73 mg/dL (ref 65–99)

## 2017-02-23 MED ORDER — SODIUM CHLORIDE 0.9 % IV BOLUS (SEPSIS)
1000.0000 mL | Freq: Once | INTRAVENOUS | Status: AC
  Administered 2017-02-23: 1000 mL via INTRAVENOUS

## 2017-02-23 NOTE — Discharge Instructions (Addendum)
Drink plenty of fluids and follow-up with your doctor this week for recheck °

## 2017-02-23 NOTE — ED Triage Notes (Signed)
Pt has not been feeling well since last Sunday, [facility] with abd pain, given Mylanta, since has had increased weakness and sleeping more. Few weeks ago N/V/D. Pt denies any complaints or pain

## 2017-02-23 NOTE — ED Notes (Signed)
Patient reminded to call out when ready to give urine sample. Family also aware of pending urine sample. Patient stated that he doesnt need to go at this time.

## 2017-02-23 NOTE — ED Provider Notes (Signed)
[facility]-EMERGENCY DEPT Provider Note   CSN: [id number] Arrival date & time: 02/23/17  1054     History   Chief Complaint Chief Complaint  Patient presents with  . Weakness    HPI Cesar Maldonado is a 84 y.o. male.  Patient was brought in by his family today because he has been not eating or drinking as much.  He also had some abdominal discomfort.  And they stated he is sleeping more than usual   The history is provided by the patient and a caregiver. No language interpreter was used.  Weakness  Primary symptoms include no focal weakness. This is a recurrent problem. The current episode started 12 to 24 hours ago. The problem has not changed since onset.There was no focality noted. There has been no fever. Pertinent negatives include no shortness of breath, no chest pain, no vomiting and no headaches.    Past Medical History:  Diagnosis Date  . BPH (benign prostatic hyperplasia)   . 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 Dementia   . Hypertension   . Left ventricular diastolic dysfunction, NYHA class 1   . Mental disorder    alzhiemers  . Syncope    Cardionet 04/14/12    Patient Active Problem List   Diagnosis Date Noted  . Protein-calorie malnutrition, severe (HCC) 10/23/2013  . Acute respiratory failure with hypoxia (HCC) 10/21/2013  . Bradycardia 10/21/2013  . CKD (chronic kidney disease), stage III (HCC) 10/21/2013  . Abnormal head CT 10/21/2013  . Syncope 10/20/2013  . CAP (community acquired pneumonia) 10/20/2013  . Dehydration 02/25/2012  . Syncope and collapse 02/24/2012  . Hypertension   . Dementia   . BPH (benign prostatic hyperplasia)   . Left ventricular diastolic dysfunction, NYHA class 1   . Cerebrovascular disease     Past Surgical History:  Procedure Laterality Date  . BREAST SURGERY     left tumor  . PARS PLANA VITRECTOMY  12/19/2010   Procedure: PARS PLANA VITRECTOMY WITH INTRAOCULAR LENS;  Surgeon: Shade FloodGreer Geiger, MD;  [facility];  Service: Ophthalmology;  Laterality: Right;  PARS PLANA VITRECTOMY WITH INTRAOCULAR LENS [900]membrane peel,endolaser right eye       Home Medications    Prior to Admission medications   Medication Sig Start Date End Date Taking? Authorizing Provider  acidophilus (RISAQUAD) CAPS capsule Take 1 capsule by mouth every other day.   Yes [provider]  CARTIA XT 120 MG 24 hr capsule Take 1 capsule by mouth daily. 02/20/17  Yes [provider]  cholecalciferol (VITAMIN D) 1000 units tablet Take 1,000 Units by mouth every other day.   Yes [provider]  dorzolamide-timolol (COSOPT) 22.3-6.8 MG/ML ophthalmic solution Place 1 drop into both eyes 2 (two) times daily.    Yes [provider]  lactose free nutrition (BOOST) LIQD Take 237 mLs by mouth every other day.    Yes [provider]  latanoprost (XALATAN) 0.005 % ophthalmic solution Place 1 drop into both eyes at bedtime.   Yes [provider]  Multiple Vitamins-Minerals (CENTRUM PO) Take 1 tablet by mouth every other day.    Yes [provider]    Family History Family History  Problem Relation Age of Onset  . Cancer Sister     Social History Social History   Tobacco Use  . Smoking status: Former Smoker    Last attempt to quit: 01/15/1988  Years since quitting: 29.1  . Smokeless tobacco: Never Used  Substance Use Topics  . Alcohol use: No    Alcohol/week: 1.2 oz    Types: 2 Shots of liquor per week  . Drug use: No     Allergies   Patient has no known allergies.   Review of Systems Review of Systems  Constitutional: Negative for appetite change and fatigue.  HENT: Negative for congestion, ear discharge and sinus pressure.   Eyes: Negative for discharge.  Respiratory: Negative for cough and shortness of breath.   Cardiovascular: Negative for chest pain.    Gastrointestinal: Negative for abdominal pain, diarrhea and vomiting.  Genitourinary: Negative for frequency and hematuria.  Musculoskeletal: Negative for back pain.  Skin: Negative for rash.  Neurological: Positive for weakness. Negative for focal weakness, seizures and headaches.  Psychiatric/Behavioral: Negative for hallucinations.     Physical Exam Updated Vital Signs BP 130/73 (BP Location: Left Arm)   Pulse 68   Temp 97.8 F (36.6 C) (Oral)   Resp 18   Wt 52.2 kg (115 lb)   SpO2 100%   BMI 16.50 kg/m   Physical Exam  Constitutional: He appears well-developed.  HENT:  Head: Normocephalic.  Eyes: Conjunctivae and EOM are normal. No scleral icterus.  Neck: Neck supple. No thyromegaly present.  Cardiovascular: Normal rate and regular rhythm. Exam reveals no gallop and no friction rub.  No murmur heard. Pulmonary/Chest: No stridor. He has no wheezes. He has no rales. He exhibits no tenderness.  Abdominal: He exhibits no distension. There is no tenderness. There is no rebound.  Musculoskeletal: Normal range of motion. He exhibits no edema.  Lymphadenopathy:    He has no cervical adenopathy.  Neurological: He exhibits normal muscle tone. Coordination normal.  Patient has mild confusion but is oriented to person and place  Skin: No rash noted. No erythema.  Psychiatric: He has a normal mood and affect. His behavior is normal.     ED Treatments / Results  Labs (all labs ordered are listed, but only abnormal results are displayed) Labs Reviewed  HEPATIC FUNCTION PANEL - Abnormal; Notable for the following components:      Result Value   Albumin 3.3 (*)    AST 45 (*)    ALT 12 (*)    Bilirubin, Direct <0.1 (*)    All other components within normal limits  CBC WITH DIFFERENTIAL/PLATELET - Abnormal; Notable for the following components:   RBC 3.61 (*)    Hemoglobin 9.6 (*)    HCT 29.4 (*)    Platelets 88 (*)    Lymphs Abs 0.6 (*)    All other components within  normal limits  BASIC METABOLIC PANEL - Abnormal; Notable for the following components:   Sodium 130 (*)    Chloride 95 (*)    All other components within normal limits  URINALYSIS, ROUTINE W REFLEX MICROSCOPIC  DIFFERENTIAL  CBG MONITORING, ED  I-STAT TROPONIN, ED    EKG  EKG Interpretation  Date/Time:  Sunday February 23 2017 11:44:47 EST Ventricular Rate:  67 PR Interval:    QRS Duration: 81 QT Interval:  393 QTC Calculation: 415 R Axis:   83 Text Interpretation:  Sinus rhythm Borderline right axis deviation Minimal ST elevation, inferior leads Baseline wander in lead(s) V2 Confirmed by Bethann Berkshire [phone]) on 02/23/2017 1:09:22 PM       Radiology Dg Chest 2 View  Result Date: 02/23/2017 CLINICAL DATA:  Weakness EXAM: CHEST  2 VIEW COMPARISON:  04/17/2016  FINDINGS: There is hyperinflation of the lungs compatible with COPD. Small bilateral effusions on the lateral view. No confluent airspace opacities. Heart is normal size. : Small bilateral pleural effusions.  COPD. Electronically Signed   By: Charlett Nose M.D.   On: 02/23/2017 13:01   Ct Head Wo Contrast  Result Date: 02/23/2017 CLINICAL DATA:  84 year old male with altered level of consciousness. EXAM: CT HEAD WITHOUT CONTRAST TECHNIQUE: Contiguous axial images were obtained from the base of the skull through the vertex without intravenous contrast. COMPARISON:  07/23/2015 MR and prior studies FINDINGS: Brain: No evidence of acute infarction, hemorrhage, hydrocephalus, extra-axial collection or mass lesion/mass effect. Moderate to severe atrophy, chronic small-vessel white matter ischemic changes and remote left basal ganglia infarct are again noted. Vascular: Intracranial atherosclerotic calcifications identified. Skull: Normal. Negative for fracture or focal lesion. Sinuses/Orbits: No acute finding. Other: None IMPRESSION: 1. No evidence of acute intracranial abnormality 2. Atrophy, chronic small-vessel white matter ischemic  changes and remote left basal ganglia infarct. Electronically Signed   By: Harmon Pier M.D.   On: 02/23/2017 14:16    Procedures Procedures (including critical care time)  Medications Ordered in ED Medications  sodium chloride 0.9 % bolus 1,000 mL (1,000 mLs Intravenous New Bag/Given 02/23/17 1530)     Initial Impression / Assessment and Plan / ED Course  I have reviewed the triage vital signs and the nursing notes.  Pertinent labs & imaging results that were available during my care of the patient were reviewed by me and considered in my medical decision making (see chart for details).     Patient with mild dehydration and dementia.  Patient will follow up with his PCP  Final Clinical Impressions(s) / ED Diagnoses   Final diagnoses:  Dehydration    ED Discharge Orders    None       Bethann Berkshire, MD 02/23/17 1620

## 2017-02-27 ENCOUNTER — Emergency Department (HOSPITAL_COMMUNITY): Payer: Medicare Other

## 2017-02-27 ENCOUNTER — Encounter (HOSPITAL_COMMUNITY): Payer: Self-pay | Admitting: Nurse Practitioner

## 2017-02-27 ENCOUNTER — Inpatient Hospital Stay (HOSPITAL_COMMUNITY)
Admission: EM | Admit: 2017-02-27 | Discharge: 2017-03-04 | DRG: 064 | Disposition: A | Discharge status: Rehabilitation Facility | Payer: Medicare Other | Attending: Neurology | Admitting: Neurology

## 2017-02-27 DIAGNOSIS — I610 Nontraumatic intracerebral hemorrhage in hemisphere, subcortical: Secondary | ICD-10-CM | POA: Diagnosis present

## 2017-02-27 DIAGNOSIS — I615 Nontraumatic intracerebral hemorrhage, intraventricular: Secondary | ICD-10-CM | POA: Diagnosis present

## 2017-02-27 DIAGNOSIS — J181 Lobar pneumonia, unspecified organism: Secondary | ICD-10-CM

## 2017-02-27 DIAGNOSIS — I5032 Chronic diastolic (congestive) heart failure: Secondary | ICD-10-CM | POA: Diagnosis present

## 2017-02-27 DIAGNOSIS — I619 Nontraumatic intracerebral hemorrhage, unspecified: Secondary | ICD-10-CM | POA: Diagnosis present

## 2017-02-27 DIAGNOSIS — D638 Anemia in other chronic diseases classified elsewhere: Secondary | ICD-10-CM | POA: Diagnosis present

## 2017-02-27 DIAGNOSIS — F0280 Dementia in other diseases classified elsewhere without behavioral disturbance: Secondary | ICD-10-CM | POA: Diagnosis present

## 2017-02-27 DIAGNOSIS — G936 Cerebral edema: Secondary | ICD-10-CM | POA: Diagnosis present

## 2017-02-27 DIAGNOSIS — D696 Thrombocytopenia, unspecified: Secondary | ICD-10-CM | POA: Diagnosis present

## 2017-02-27 DIAGNOSIS — J189 Pneumonia, unspecified organism: Secondary | ICD-10-CM

## 2017-02-27 DIAGNOSIS — E785 Hyperlipidemia, unspecified: Secondary | ICD-10-CM | POA: Diagnosis present

## 2017-02-27 DIAGNOSIS — G8194 Hemiplegia, unspecified affecting left nondominant side: Secondary | ICD-10-CM | POA: Diagnosis present

## 2017-02-27 DIAGNOSIS — I10 Essential (primary) hypertension: Secondary | ICD-10-CM | POA: Diagnosis not present

## 2017-02-27 DIAGNOSIS — J690 Pneumonitis due to inhalation of food and vomit: Secondary | ICD-10-CM | POA: Diagnosis present

## 2017-02-27 DIAGNOSIS — E870 Hyperosmolality and hypernatremia: Secondary | ICD-10-CM | POA: Diagnosis present

## 2017-02-27 DIAGNOSIS — I361 Nonrheumatic tricuspid (valve) insufficiency: Secondary | ICD-10-CM | POA: Diagnosis not present

## 2017-02-27 DIAGNOSIS — N400 Enlarged prostate without lower urinary tract symptoms: Secondary | ICD-10-CM | POA: Diagnosis present

## 2017-02-27 DIAGNOSIS — S06349D Traumatic hemorrhage of right cerebrum with loss of consciousness of unspecified duration, subsequent encounter: Secondary | ICD-10-CM | POA: Diagnosis not present

## 2017-02-27 DIAGNOSIS — Z681 Body mass index (BMI) 19 or less, adult: Secondary | ICD-10-CM | POA: Diagnosis not present

## 2017-02-27 DIAGNOSIS — I130 Hypertensive heart and chronic kidney disease with heart failure and stage 1 through stage 4 chronic kidney disease, or unspecified chronic kidney disease: Secondary | ICD-10-CM | POA: Diagnosis present

## 2017-02-27 DIAGNOSIS — J9601 Acute respiratory failure with hypoxia: Secondary | ICD-10-CM | POA: Diagnosis present

## 2017-02-27 DIAGNOSIS — N183 Chronic kidney disease, stage 3 (moderate): Secondary | ICD-10-CM | POA: Diagnosis present

## 2017-02-27 DIAGNOSIS — I609 Nontraumatic subarachnoid hemorrhage, unspecified: Secondary | ICD-10-CM | POA: Diagnosis present

## 2017-02-27 DIAGNOSIS — R1310 Dysphagia, unspecified: Secondary | ICD-10-CM | POA: Diagnosis present

## 2017-02-27 DIAGNOSIS — E876 Hypokalemia: Secondary | ICD-10-CM | POA: Diagnosis present

## 2017-02-27 DIAGNOSIS — R40243 Glasgow coma scale score 3-8, unspecified time: Secondary | ICD-10-CM | POA: Diagnosis present

## 2017-02-27 DIAGNOSIS — E860 Dehydration: Secondary | ICD-10-CM

## 2017-02-27 DIAGNOSIS — R29810 Facial weakness: Secondary | ICD-10-CM | POA: Diagnosis present

## 2017-02-27 DIAGNOSIS — F0390 Unspecified dementia without behavioral disturbance: Secondary | ICD-10-CM | POA: Diagnosis present

## 2017-02-27 DIAGNOSIS — Z66 Do not resuscitate: Secondary | ICD-10-CM | POA: Diagnosis not present

## 2017-02-27 DIAGNOSIS — G309 Alzheimer's disease, unspecified: Secondary | ICD-10-CM | POA: Diagnosis present

## 2017-02-27 DIAGNOSIS — Z515 Encounter for palliative care: Secondary | ICD-10-CM | POA: Diagnosis not present

## 2017-02-27 DIAGNOSIS — I071 Rheumatic tricuspid insufficiency: Secondary | ICD-10-CM | POA: Diagnosis present

## 2017-02-27 DIAGNOSIS — E43 Unspecified severe protein-calorie malnutrition: Secondary | ICD-10-CM | POA: Diagnosis present

## 2017-02-27 DIAGNOSIS — Z7401 Bed confinement status: Secondary | ICD-10-CM

## 2017-02-27 DIAGNOSIS — I639 Cerebral infarction, unspecified: Secondary | ICD-10-CM | POA: Diagnosis not present

## 2017-02-27 DIAGNOSIS — A419 Sepsis, unspecified organism: Secondary | ICD-10-CM | POA: Diagnosis present

## 2017-02-27 DIAGNOSIS — I69354 Hemiplegia and hemiparesis following cerebral infarction affecting left non-dominant side: Secondary | ICD-10-CM | POA: Diagnosis not present

## 2017-02-27 DIAGNOSIS — Z87891 Personal history of nicotine dependence: Secondary | ICD-10-CM | POA: Diagnosis not present

## 2017-02-27 DIAGNOSIS — F0150 Vascular dementia without behavioral disturbance: Secondary | ICD-10-CM | POA: Diagnosis not present

## 2017-02-27 DIAGNOSIS — E1122 Type 2 diabetes mellitus with diabetic chronic kidney disease: Secondary | ICD-10-CM | POA: Diagnosis present

## 2017-02-27 DIAGNOSIS — I519 Heart disease, unspecified: Secondary | ICD-10-CM | POA: Diagnosis not present

## 2017-02-27 DIAGNOSIS — R0603 Acute respiratory distress: Secondary | ICD-10-CM | POA: Diagnosis not present

## 2017-02-27 LAB — CBC WITH DIFFERENTIAL/PLATELET
Basophils Absolute: 0 10*3/uL (ref 0.0–0.1)
Basophils Relative: 0 %
Eosinophils Absolute: 0 10*3/uL (ref 0.0–0.7)
Eosinophils Relative: 0 %
HCT: 30.1 % — ABNORMAL LOW (ref 39.0–52.0)
Hemoglobin: 10 g/dL — ABNORMAL LOW (ref 13.0–17.0)
Lymphocytes Relative: 7 %
Lymphs Abs: 0.5 10*3/uL — ABNORMAL LOW (ref 0.7–4.0)
MCH: 26.8 pg (ref 26.0–34.0)
MCHC: 33.2 g/dL (ref 30.0–36.0)
MCV: 80.7 fL (ref 78.0–100.0)
Monocytes Absolute: 0.3 10*3/uL (ref 0.1–1.0)
Monocytes Relative: 4 %
Neutro Abs: 6 10*3/uL (ref 1.7–7.7)
Neutrophils Relative %: 89 %
Platelets: 65 10*3/uL — ABNORMAL LOW (ref 150–400)
RBC: 3.73 MIL/uL — ABNORMAL LOW (ref 4.22–5.81)
RDW: 13.5 % (ref 11.5–15.5)
WBC: 6.8 10*3/uL (ref 4.0–10.5)

## 2017-02-27 LAB — INFLUENZA PANEL BY PCR (TYPE A & B)
Influenza A By PCR: NEGATIVE
Influenza B By PCR: NEGATIVE

## 2017-02-27 LAB — COMPREHENSIVE METABOLIC PANEL
ALT: 18 U/L (ref 17–63)
AST: 71 U/L — ABNORMAL HIGH (ref 15–41)
Albumin: 3.2 g/dL — ABNORMAL LOW (ref 3.5–5.0)
Alkaline Phosphatase: 97 U/L (ref 38–126)
Anion gap: 15 (ref 5–15)
BUN: 20 mg/dL (ref 6–20)
CO2: 21 mmol/L — ABNORMAL LOW (ref 22–32)
Calcium: 8.9 mg/dL (ref 8.9–10.3)
Chloride: 94 mmol/L — ABNORMAL LOW (ref 101–111)
Creatinine, Ser: 1.3 mg/dL — ABNORMAL HIGH (ref 0.61–1.24)
GFR calc Af Amer: 54 mL/min — ABNORMAL LOW (ref >60)
GFR calc non Af Amer: 46 mL/min — ABNORMAL LOW (ref >60)
Glucose, Bld: 103 mg/dL — ABNORMAL HIGH (ref 65–99)
Potassium: 3.8 mmol/L (ref 3.5–5.1)
Sodium: 130 mmol/L — ABNORMAL LOW (ref 135–145)
Total Bilirubin: 0.7 mg/dL (ref 0.3–1.2)
Total Protein: 7.4 g/dL (ref 6.5–8.1)

## 2017-02-27 LAB — BLOOD GAS, ARTERIAL
Acid-base deficit: 0.3 mmol/L (ref 0.0–2.0)
Bicarbonate: 21.9 mmol/L (ref 20.0–28.0)
Drawn by: 270211
O2 Content: 5 L/min
O2 Saturation: 90.8 %
Patient temperature: 98.6
pCO2 arterial: 28.4 mmHg — ABNORMAL LOW (ref 32.0–48.0)
pH, Arterial: 7.499 — ABNORMAL HIGH (ref 7.350–7.450)
pO2, Arterial: 59.7 mmHg — ABNORMAL LOW (ref 83.0–108.0)

## 2017-02-27 LAB — I-STAT CG4 LACTIC ACID, ED
Lactic Acid, Venous: 3.73 mmol/L (ref 0.5–1.9)
Lactic Acid, Venous: 3.15 mmol/L (ref 0.5–1.9)

## 2017-02-27 MED ORDER — SODIUM CHLORIDE 0.9 % IV BOLUS (SEPSIS)
500.0000 mL | Freq: Once | INTRAVENOUS | Status: AC
  Administered 2017-02-27: 500 mL via INTRAVENOUS

## 2017-02-27 MED ORDER — SODIUM CHLORIDE 0.9 % IV BOLUS (SEPSIS)
1000.0000 mL | Freq: Once | INTRAVENOUS | Status: AC
  Administered 2017-02-27: 1000 mL via INTRAVENOUS

## 2017-02-27 MED ORDER — SODIUM CHLORIDE 0.9 % IV SOLN
2.0000 g | Freq: Once | INTRAVENOUS | Status: AC
  Administered 2017-02-27: 2 g via INTRAVENOUS
  Filled 2017-02-27: qty 2

## 2017-02-27 MED ORDER — VANCOMYCIN HCL IN DEXTROSE 1-5 GM/200ML-% IV SOLN: 1000.0000 mg | Freq: Once | INTRAVENOUS | Status: DC

## 2017-02-27 MED ORDER — PIPERACILLIN-TAZOBACTAM 3.375 G IVPB 30 MIN: 3.3750 g | Freq: Once | INTRAVENOUS | Status: DC

## 2017-02-27 MED ORDER — SODIUM CHLORIDE 0.9 % IV BOLUS (SEPSIS)
250.0000 mL | Freq: Once | INTRAVENOUS | Status: AC
  Administered 2017-02-27: 250 mL via INTRAVENOUS

## 2017-02-27 NOTE — Progress Notes (Signed)
A consult was received from an ED physician for cefepime per pharmacy dosing.  The patient's profile has been reviewed for ht/wt/allergies/indication/available labs.   A one time order has been placed for cefepime 2gm.  Further antibiotics/pharmacy consults should be ordered by admitting physician if indicated.                       Thank you, Arley Phenixllen Reese Stockman RPh 02/27/2017, 7:44 PM Pager [phone]

## 2017-02-27 NOTE — ED Notes (Signed)
Pt has a condom cath for urine collection

## 2017-02-27 NOTE — ED Notes (Signed)
ED TO INPATIENT HANDOFF REPORT  Name/Age/Gender Cesar Maldonado 84 y.o. male  Code Status Code Status History    Date Active Date Inactive Code Status Order ID Comments User Context   10/21/2013 01:41 10/25/2013 16:08 Full Code [id number]  Ivor Costa, MD Inpatient   02/24/2012 21:15 02/26/2012 14:24 Full Code [id number]  Rama, Venetia Maxon, MD Inpatient      Home/SNF/Other Home  Chief Complaint Altered Mental Status  Level of Care/Admitting Diagnosis ED Disposition    ED Disposition Condition [place]: [place] [[id number]]  Level of Care: ICU [6]  Diagnosis: Basal ganglia hemorrhage [facility]) [[id number]]  Admitting Physician: Radford Pax [[id number]]  Attending Physician: Radford Pax [[id number]]  Estimated length of stay: past midnight tomorrow  Certification:: I certify this patient will need inpatient services for at least 2 midnights  Bed request comments: 4 N Neuro ICU  PT Class (Do Not Modify): Inpatient [101]  PT Acc Code (Do Not Modify): Private [1]       Medical History Past Medical History:  Diagnosis Date  . BPH (benign prostatic hyperplasia)   . 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 Dementia   . Hypertension   . Left ventricular diastolic dysfunction, NYHA class 1   . Mental disorder    alzhiemers  . Syncope    Cardionet 04/14/12    Allergies No Known Allergies  IV Location/Drains/Wounds Patient Lines/Drains/Airways Status   Active Line/Drains/Airways    Name:   Placement date:   Placement time:   Site:   Days:   Peripheral IV 02/27/17 Right;Lateral Forearm   02/27/17    1857    Forearm   less than 1          Labs/Imaging Results for orders placed or performed during the hospital encounter of 02/27/17 (from the past 48 hour(s))  Comprehensive metabolic panel     Status: Abnormal   Collection Time: 02/27/17  6:50 PM  Result Value Ref  Range   Sodium 130 (L) 135 - 145 mmol/L   Potassium 3.8 3.5 - 5.1 mmol/L   Chloride 94 (L) 101 - 111 mmol/L   CO2 21 (L) 22 - 32 mmol/L   Glucose, Bld 103 (H) 65 - 99 mg/dL   BUN 20 6 - 20 mg/dL   Creatinine, Ser 1.30 (H) 0.61 - 1.24 mg/dL   Calcium 8.9 8.9 - 10.3 mg/dL   Total Protein 7.4 6.5 - 8.1 g/dL   Albumin 3.2 (L) 3.5 - 5.0 g/dL   AST 71 (H) 15 - 41 U/L   ALT 18 17 - 63 U/L   Alkaline Phosphatase 97 38 - 126 U/L   Total Bilirubin 0.7 0.3 - 1.2 mg/dL   GFR calc non Af Amer 46 (L) >60 mL/min   GFR calc Af Amer 54 (L) >60 mL/min    Comment: (NOTE) The eGFR has been calculated using the CKD EPI equation. This calculation has not been validated in all clinical situations. eGFR's persistently <60 mL/min signify possible Chronic Kidney Disease.    Anion gap 15 5 - 15    Comment: Performed at [facility], [place] [address], [place], [place] [address]  CBC with Differential     Status: Abnormal   Collection Time: 02/27/17  6:50 PM  Result Value Ref Range   WBC 6.8 4.0 - 10.5 K/uL   RBC 3.73 (L) 4.22 -  5.81 MIL/uL   Hemoglobin 10.0 (L) 13.0 - 17.0 g/dL   HCT 30.1 (L) 39.0 - 52.0 %   MCV 80.7 78.0 - 100.0 fL   MCH 26.8 26.0 - 34.0 pg   MCHC 33.2 30.0 - 36.0 g/dL   RDW 13.5 11.5 - 15.5 %   Platelets 65 (L) 150 - 400 K/uL    Comment: PLATELET COUNT CONFIRMED BY SMEAR   Neutrophils Relative % 89 %   Lymphocytes Relative 7 %   Monocytes Relative 4 %   Eosinophils Relative 0 %   Basophils Relative 0 %   Neutro Abs 6.0 1.7 - 7.7 K/uL   Lymphs Abs 0.5 (L) 0.7 - 4.0 K/uL   Monocytes Absolute 0.3 0.1 - 1.0 K/uL   Eosinophils Absolute 0.0 0.0 - 0.7 K/uL   Basophils Absolute 0.0 0.0 - 0.1 K/uL   WBC Morphology MILD LEFT SHIFT (1-5% METAS, OCC MYELO, OCC BANDS)     Comment: Performed at [facility], [place] [address], [place], [place] [address]  Blood gas, arterial     Status: Abnormal   Collection Time: 02/27/17  6:55 PM  Result Value Ref Range    O2 Content 5.0 L/min   Delivery systems NASAL CANNULA    pH, Arterial 7.499 (H) 7.350 - 7.450   pCO2 arterial 28.4 (L) 32.0 - 48.0 mmHg   pO2, Arterial 59.7 (L) 83.0 - 108.0 mmHg   Bicarbonate 21.9 20.0 - 28.0 mmol/L   Acid-base deficit 0.3 0.0 - 2.0 mmol/L   O2 Saturation 90.8 %   Patient temperature 98.6    Collection site LEFT RADIAL    Drawn by [id number]    Sample type ARTERIAL DRAW    Allens test (pass/fail) PASS PASS    Comment: Performed at [facility], [place] [address] [place], [place] [address]  I-Stat CG4 Lactic Acid, ED     Status: Abnormal   Collection Time: 02/27/17  7:06 PM  Result Value Ref Range   Lactic Acid, Venous 3.73 (HH) 0.5 - 1.9 mmol/L   Comment NOTIFIED PHYSICIAN   Influenza panel by PCR (type A & B)     Status: None   Collection Time: 02/27/17  8:10 PM  Result Value Ref Range   Influenza A By PCR NEGATIVE NEGATIVE   Influenza B By PCR NEGATIVE NEGATIVE    Comment: (NOTE) The Xpert Xpress Flu assay is intended as an aid in the diagnosis of  influenza and should not be used as a sole basis for treatment.  This  assay is FDA approved for nasopharyngeal swab specimens only. Nasal  washings and aspirates are unacceptable for Xpert Xpress Flu testing. Performed at [facility], [place] [address], [place], [place] [address]   I-Stat CG4 Lactic Acid, ED     Status: Abnormal   Collection Time: 02/27/17  8:19 PM  Result Value Ref Range   Lactic Acid, Venous 3.15 (HH) 0.5 - 1.9 mmol/L   Comment NOTIFIED PHYSICIAN    Dg Chest 2 View  Result Date: 02/27/2017 CLINICAL DATA:  Cough and shortness of breath. EXAM: CHEST  2 VIEW COMPARISON:  02/23/2017 FINDINGS: The cardiac silhouette, mediastinal and hilar contours are within normal limits and stable. There is mild tortuosity of the thoracic aorta. Chronic underlying emphysema. New bilateral pleural effusions and left lower lobe infiltrate IMPRESSION: Left lower lobe pneumonia and  bilateral pleural effusions. Electronically Signed   By: Marijo Sanes M.D.   On: 02/27/2017 19:34  Ct Head Wo Contrast  Result Date: 02/27/2017 CLINICAL DATA:  Head trauma, moderate to severe, low G CS. Fall on Wednesday evening with altered mental status. EXAM: CT HEAD WITHOUT CONTRAST TECHNIQUE: Contiguous axial images were obtained from the base of the skull through the vertex without intravenous contrast. COMPARISON:  02/23/2017 FINDINGS: Brain: 3.3 x 2.4 x 2.5 cm (~20cc) hematoma centered in the right basal ganglia, centered at the genu of the internal capsule and contacting the putamen, caudate head, anterior thalamus. There is intraventricular extension with layering hazy clot in the occipital horns. There is ventriculomegaly, but stable from prior and attributed to advanced brain atrophy. Remote lacunar infarct in the left caudate head. No evidence of cortical infarct. Hazy low-density in the periventricular white matter. Vascular: Atherosclerotic calcification. Skull: No acute or aggressive finding. Sinuses/Orbits: Bilateral cataract resection. Other: Critical Value/emergent results were called by telephone at the time of interpretation on 02/27/2017 at 8:02 pm to Dr. Dalia Heading , who verbally acknowledged these results. IMPRESSION: 1. ~20 cc hematoma centered in the right basal ganglia and internal capsule. There is intraventricular hemorrhage with blood layering in the occipital horns of lateral ventricles. 2. Severe brain atrophy with chronic small vessel ischemia. Electronically Signed   By: Monte Fantasia M.D.   On: 02/27/2017 20:02    Pending Labs Unresulted Labs (From admission, onward)   Start     Ordered   02/27/17 1920  Blood Culture (routine x 2)  BLOOD CULTURE X 2,   STAT     02/27/17 1920   02/27/17 1817  Urinalysis, Routine w reflex microscopic  STAT,   STAT     02/27/17 1816      Vitals/Pain Today's Vitals   02/27/17 1905 02/27/17 1915 02/27/17 1930 02/27/17 2015   BP:   (!) 143/80 133/72  Pulse:  92 92 93  Resp:  (!) 22 (!) 25 (!) 21  Temp: (!) 101.3 F (38.5 C)     TempSrc: Rectal     SpO2:  98% 92% 96%  PainSc:        Isolation Precautions No active isolations  Medications Medications  sodium chloride 0.9 % bolus 1,000 mL (0 mLs Intravenous Stopped 02/27/17 2038)    And  sodium chloride 0.9 % bolus 500 mL (0 mLs Intravenous Stopped 02/27/17 2024)    And  sodium chloride 0.9 % bolus 250 mL (0 mLs Intravenous Stopped 02/27/17 2017)  ceFEPIme (MAXIPIME) 2 g in sodium chloride 0.9 % 100 mL IVPB (0 g Intravenous Stopped 02/27/17 2039)    Mobility non-ambulatory

## 2017-02-27 NOTE — ED Triage Notes (Signed)
Pt is presented for evaluation of ongoing abdominal symptoms and weakness. Family en route to offer more history of presenting symptoms.

## 2017-02-27 NOTE — ED Notes (Signed)
[facility] Expected date:  Expected time:  Means of arrival:  Comments: EMS-altered room 8 or 1

## 2017-02-28 ENCOUNTER — Inpatient Hospital Stay (HOSPITAL_COMMUNITY): Payer: Medicare Other

## 2017-02-28 ENCOUNTER — Other Ambulatory Visit: Payer: Self-pay

## 2017-02-28 DIAGNOSIS — J181 Lobar pneumonia, unspecified organism: Secondary | ICD-10-CM

## 2017-02-28 DIAGNOSIS — Z515 Encounter for palliative care: Secondary | ICD-10-CM

## 2017-02-28 DIAGNOSIS — I610 Nontraumatic intracerebral hemorrhage in hemisphere, subcortical: Principal | ICD-10-CM

## 2017-02-28 DIAGNOSIS — I361 Nonrheumatic tricuspid (valve) insufficiency: Secondary | ICD-10-CM

## 2017-02-28 DIAGNOSIS — I10 Essential (primary) hypertension: Secondary | ICD-10-CM

## 2017-02-28 DIAGNOSIS — E785 Hyperlipidemia, unspecified: Secondary | ICD-10-CM

## 2017-02-28 DIAGNOSIS — F0390 Unspecified dementia without behavioral disturbance: Secondary | ICD-10-CM

## 2017-02-28 DIAGNOSIS — I639 Cerebral infarction, unspecified: Secondary | ICD-10-CM

## 2017-02-28 DIAGNOSIS — I619 Nontraumatic intracerebral hemorrhage, unspecified: Secondary | ICD-10-CM | POA: Diagnosis present

## 2017-02-28 LAB — BASIC METABOLIC PANEL
ANION GAP: 13 (ref 5–15)
BUN: 19 mg/dL (ref 6–20)
CO2: 21 mmol/L — ABNORMAL LOW (ref 22–32)
Calcium: 8.4 mg/dL — ABNORMAL LOW (ref 8.9–10.3)
Chloride: 99 mmol/L — ABNORMAL LOW (ref 101–111)
Creatinine, Ser: 1.19 mg/dL (ref 0.61–1.24)
GFR, EST AFRICAN AMERICAN: 60 mL/min — AB (ref >60)
GFR, EST NON AFRICAN AMERICAN: 52 mL/min — AB (ref >60)
Glucose, Bld: 92 mg/dL (ref 65–99)
POTASSIUM: 3.4 mmol/L — AB (ref 3.5–5.1)
Sodium: 133 mmol/L — ABNORMAL LOW (ref 135–145)

## 2017-02-28 LAB — CBC
HEMATOCRIT: 29.9 % — AB (ref 39.0–52.0)
Hemoglobin: 9.7 g/dL — ABNORMAL LOW (ref 13.0–17.0)
MCH: 26 pg (ref 26.0–34.0)
MCHC: 32.4 g/dL (ref 30.0–36.0)
MCV: 80.2 fL (ref 78.0–100.0)
Platelets: 75 10*3/uL — ABNORMAL LOW (ref 150–400)
RBC: 3.73 MIL/uL — AB (ref 4.22–5.81)
RDW: 13.5 % (ref 11.5–15.5)
WBC: 7.4 10*3/uL (ref 4.0–10.5)

## 2017-02-28 LAB — ECHOCARDIOGRAM COMPLETE
Height: 68 in
Weight: 1657.86 oz

## 2017-02-28 LAB — URINALYSIS, ROUTINE W REFLEX MICROSCOPIC
Bilirubin Urine: NEGATIVE
Glucose, UA: NEGATIVE mg/dL
Hgb urine dipstick: NEGATIVE
KETONES UR: 5 mg/dL — AB
LEUKOCYTES UA: NEGATIVE
NITRITE: NEGATIVE
PH: 5 (ref 5.0–8.0)
Protein, ur: 30 mg/dL — AB
Specific Gravity, Urine: 1.029 (ref 1.005–1.030)

## 2017-02-28 LAB — LIPID PANEL
CHOL/HDL RATIO: 5.3 ratio
CHOLESTEROL: 176 mg/dL (ref 0–200)
HDL: 33 mg/dL — AB (ref >40)
LDL Cholesterol: 120 mg/dL — ABNORMAL HIGH (ref 0–99)
TRIGLYCERIDES: 115 mg/dL (ref <150)
VLDL: 23 mg/dL (ref 0–40)

## 2017-02-28 LAB — VITAMIN B12: VITAMIN B 12: 618 pg/mL (ref 180–914)

## 2017-02-28 LAB — HEMOGLOBIN A1C
Hgb A1c MFr Bld: 6.4 % — ABNORMAL HIGH (ref 4.8–5.6)
MEAN PLASMA GLUCOSE: 136.98 mg/dL

## 2017-02-28 LAB — TSH: TSH: 2.003 u[IU]/mL (ref 0.350–4.500)

## 2017-02-28 LAB — MRSA PCR SCREENING: MRSA BY PCR: NEGATIVE

## 2017-02-28 MED ORDER — ACETAMINOPHEN 160 MG/5ML PO SOLN
650.0000 mg | ORAL | Status: DC | PRN
Start: 2017-02-28 — End: 2017-03-04

## 2017-02-28 MED ORDER — DORZOLAMIDE HCL-TIMOLOL MAL 2-0.5 % OP SOLN
1.0000 [drp] | Freq: Two times a day (BID) | OPHTHALMIC | Status: DC
  Administered 2017-02-28 – 2017-03-04 (×10): 1 [drp] via OPHTHALMIC
  Filled 2017-02-28: qty 10

## 2017-02-28 MED ORDER — ACETAMINOPHEN 325 MG PO TABS
650.0000 mg | ORAL_TABLET | ORAL | Status: DC | PRN
Start: 2017-02-28 — End: 2017-03-04

## 2017-02-28 MED ORDER — SENNOSIDES-DOCUSATE SODIUM 8.6-50 MG PO TABS
1.0000 | ORAL_TABLET | Freq: Two times a day (BID) | ORAL | Status: DC
  Administered 2017-03-03 – 2017-03-04 (×2): 1 via ORAL
  Filled 2017-02-28 (×3): qty 1

## 2017-02-28 MED ORDER — LATANOPROST 0.005 % OP SOLN
1.0000 [drp] | Freq: Every day | OPHTHALMIC | Status: DC
  Administered 2017-02-28 – 2017-03-03 (×5): 1 [drp] via OPHTHALMIC
  Filled 2017-02-28: qty 2.5

## 2017-02-28 MED ORDER — LABETALOL HCL 5 MG/ML IV SOLN: 10.0000 mg | INTRAVENOUS | Status: DC | PRN

## 2017-02-28 MED ORDER — DILTIAZEM HCL ER COATED BEADS 240 MG PO CP24
240.0000 mg | ORAL_CAPSULE | Freq: Every day | ORAL | Status: DC
  Filled 2017-02-28 (×2): qty 1

## 2017-02-28 MED ORDER — SODIUM CHLORIDE 0.9 % IV SOLN
INTRAVENOUS | Status: DC
  Administered 2017-02-28 – 2017-03-03 (×3): via INTRAVENOUS

## 2017-02-28 MED ORDER — ORAL CARE MOUTH RINSE
15.0000 mL | Freq: Two times a day (BID) | OROMUCOSAL | Status: DC
  Administered 2017-02-28 – 2017-03-04 (×8): 15 mL via OROMUCOSAL

## 2017-02-28 MED ORDER — STROKE: EARLY STAGES OF RECOVERY BOOK
Freq: Once | Status: AC
  Administered 2017-02-28: 01:00:00
  Filled 2017-02-28: qty 1

## 2017-02-28 MED ORDER — IOPAMIDOL (ISOVUE-370) INJECTION 76%
INTRAVENOUS | Status: AC
  Administered 2017-02-28: 50 mL
  Filled 2017-02-28: qty 50

## 2017-02-28 MED ORDER — CHLORHEXIDINE GLUCONATE 0.12 % MT SOLN
15.0000 mL | Freq: Two times a day (BID) | OROMUCOSAL | Status: DC
  Administered 2017-02-28 – 2017-03-04 (×9): 15 mL via OROMUCOSAL
  Filled 2017-02-28 (×4): qty 15

## 2017-02-28 MED ORDER — ACETAMINOPHEN 650 MG RE SUPP: 650.0000 mg | RECTAL | Status: DC | PRN

## 2017-02-28 MED ORDER — PANTOPRAZOLE SODIUM 40 MG IV SOLR
40.0000 mg | Freq: Every day | INTRAVENOUS | Status: DC
  Administered 2017-03-01 – 2017-03-03 (×3): 40 mg via INTRAVENOUS
  Filled 2017-02-28 (×3): qty 40

## 2017-02-28 NOTE — Progress Notes (Signed)
Called pt's wife and LM regarding palliative consult . Will cont to FU over the weekend for GOC discussion, Cesar Maldonado, ANP

## 2017-02-28 NOTE — Progress Notes (Signed)
OT Cancellation Note  Patient Details Name: Cesar Maldonado MRN: [id number] DOB: [birth year]   Cancelled Treatment:    Reason Eval/Treat Not Completed: Active bedrest order  [facility]  Demar Shad, OT/L  [phone] 02/28/2017 02/28/2017, 7:28 AM

## 2017-02-28 NOTE — H&P (Addendum)
Chief Complaint: Confusion   History obtained from: Patient and Chart    HPI:                                                                                                                                       Cesar Maldonado is an 84 y.o. male past medical history of dementia, hypertension and chronic white matter disease transferred to [facility] after presenting to [facility] emergency room.  He was  brought by family for worsening confusion and reduced food intake since last 1-2 days. Head CT was performed which showed acute right basal ganglia/thalamus hematoma with intraventricular extension. His blood pressure has remained below 140 systolic throughout his ER stay and on arrival.   Repeat CT head was stable and CTA head did not show any aneurysm/AVM. Does have history of dementia significant white matter disease. Is not on any blood thinners at home.  Date last known well: 2.13.18 tPA Given: hemorrhage   Intracerebral Hemorrhage (ICH) Score  Glascow Coma Score  13-15 0  Age >/= 80 yes +1  ICH volume >/= 30ml no 0  IVH yes +1  Infratentorial origin yes no 0 Total: 2   Past Medical History:  Diagnosis Date  . BPH (benign prostatic hyperplasia)   . 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 Dementia   . Hypertension   . Left ventricular diastolic dysfunction, NYHA class 1   . Mental disorder    alzhiemers  . Syncope    Cardionet 04/14/12    Past Surgical History:  Procedure Laterality Date  . BREAST SURGERY     left tumor  . PARS PLANA VITRECTOMY  12/19/2010   Procedure: PARS PLANA VITRECTOMY WITH INTRAOCULAR LENS;  Surgeon: Shade Flood, MD;  Location: [facility] OR;  Service: Ophthalmology;  Laterality: Right;  PARS PLANA VITRECTOMY WITH INTRAOCULAR LENS [900]membrane peel,endolaser right eye    Family History  Problem Relation Age of Onset  . Cancer Sister    Social History:   reports that he quit smoking about 29 years ago. he has never used smokeless tobacco. He reports that he does not drink alcohol or use drugs.  Allergies: No Known Allergies  Medications:  I reviewed home medications.   ROS:                                                                                                                                     14 systems reviewed and negative except above .   Examination:                                                                                                      General: Appears well-developed and well-nourished.  Psych: Affect appropriate to situation Eyes: No scleral injection HENT: No OP obstrucion Head: Normocephalic.  Cardiovascular: Normal rate and regular rhythm.  Respiratory: Effort normal and breath sounds normal to anterior ascultation GI: Soft.  No distension. There is no tenderness.  Skin: WDI   Neurological Examination Mental Status: Somnolent, answers some questions but confused. Follows simple commands after resting multiple times.   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 V,VII: smile symmetric, facial light touch sensation normal bilaterally VIII: hearing normal bilaterally IX,X: uvula rises symmetrically XI: bilateral shoulder shrug XII: midline tongue extension Motor: Right : Upper extremity   4/5    Left:     Upper extremity   4/5  Lower extremity   4/5     Lower extremity   4/5 Tone and bulk:normal tone throughout; no atrophy noted Sensory: Pinprick and light touch intact throughout, bilaterally Deep Tendon Reflexes: 2+ and symmetric throughout Plantars: Right: downgoing   Left: downgoing Cerebellar: Was not able to assess however no obvious ataxia seen Gait: normal gait and station     Lab  Results: Basic Metabolic Panel: Recent Labs  Lab 02/23/17 1237 02/27/17 1850  NA 130* 130*  K 4.2 3.8  CL 95* 94*  CO2 26 21*  GLUCOSE 95 103*  BUN 15 20  CREATININE 1.04 1.30*  CALCIUM 9.2 8.9    CBC: Recent Labs  Lab 02/23/17 1237 02/27/17 1850  WBC 5.4 6.8  NEUTROABS 3.7 6.0  HGB 9.6* 10.0*  HCT 29.4* 30.1*  MCV 81.4 80.7  PLT 88* 65*    Coagulation Studies: No results for input(s): LABPROT, INR in the last 72 hours.  Imaging: Dg Chest 2 View  Result Date: 02/27/2017 CLINICAL DATA:  Cough and shortness of breath. EXAM: CHEST  2 VIEW COMPARISON:  02/23/2017 FINDINGS: The cardiac silhouette, mediastinal and hilar contours are within normal limits and stable. There is mild tortuosity of the thoracic aorta. Chronic  underlying emphysema. New bilateral pleural effusions and left lower lobe infiltrate IMPRESSION: Left lower lobe pneumonia and bilateral pleural effusions. Electronically Signed   By: Rudie Meyer M.D.   On: 02/27/2017 19:34   Ct Head Wo Contrast  Result Date: 02/27/2017 CLINICAL DATA:  Head trauma, moderate to severe, low G CS. Fall on Wednesday evening with altered mental status. EXAM: CT HEAD WITHOUT CONTRAST TECHNIQUE: Contiguous axial images were obtained from the base of the skull through the vertex without intravenous contrast. COMPARISON:  02/23/2017 FINDINGS: Brain: 3.3 x 2.4 x 2.5 cm (~20cc) hematoma centered in the right basal ganglia, centered at the genu of the internal capsule and contacting the putamen, caudate head, anterior thalamus. There is intraventricular extension with layering hazy clot in the occipital horns. There is ventriculomegaly, but stable from prior and attributed to advanced brain atrophy. Remote lacunar infarct in the left caudate head. No evidence of cortical infarct. Hazy low-density in the periventricular white matter. Vascular: Atherosclerotic calcification. Skull: No acute or aggressive finding. Sinuses/Orbits: Bilateral  cataract resection. Other: Critical Value/emergent results were called by telephone at the time of interpretation on 02/27/2017 at 8:02 pm to Dr. Charlestine Night , who verbally acknowledged these results. IMPRESSION: 1. ~20 cc hematoma centered in the right basal ganglia and internal capsule. There is intraventricular hemorrhage with blood layering in the occipital horns of lateral ventricles. 2. Severe brain atrophy with chronic small vessel ischemia. Electronically Signed   By: Marnee Spring M.D.   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 He does have significant atherosclerotic disease. MRI brain ordered: Pending BP goal less than 140 systolic, labetalol when necessary is ordered for blood pressures greater than 150 systolic Swallow eval before starting feeds  Hypertension Patient's blood pressure not significantly elevated, not requiring Cardene drip. BP goal less than 140 systolic, labetalol when necessary is ordered for blood pressures greater than 150 systolic  Dementia Avoid sedating medications  CODE STATUS: Full code, had lengthy discussion with family regarding goals of care and possibly requiring intubation patient clinically declined. Family appeared to be leaning towards DNR/DNI however said that they would go back home and review of his advanced directive which were making any decisions.    This patient is neurologically critically ill due to ICH.  He is at risk for significant risk of neurological worsening from cerebral edema,  death from brain herniation, heart failure, infection, aspiration, respiratory failure and seizure. This patient's care requires constant monitoring of vital signs, hemodynamics, respiratory and cardiac monitoring, review of multiple  databases, neurological assessment, discussion with family, other specialists and medical decision making of high complexity.  I spent  65  minutes of neurocritical time in the care of this patient.     Sushanth Aroor [facility] Pager Number [phone]

## 2017-02-28 NOTE — Consult Note (Signed)
Physical Medicine and Rehabilitation Consult Reason for Consult:Decreased functional mobility Referring Physician: Dr Roda Shutters   HPI: Cesar Maldonado is a 84 y.o. right handed male with history of dementia, hypertension. Per chart review patient lives with spouse. Independent prior to admission. Daughter helps with finances. One level home. Presented 02/28/2017 with altered mental status decrease in appetite over the past 1-2 days. CT the head showed acute right basal ganglia/ thalamus hematoma. No reports of trauma. Blood pressure with systolics in the 140s in the ED. CTA of head did not show any aneurysm or AVM. Echocardiogram is pending.MRI pending. Physical therapy evaluation completed 02/28/2017 with recommendations of physical medicine rehabilitation consult.    Review of Systems  Constitutional: Positive for weight loss. Negative for fever.       Decreased appetite over the past couple of days  HENT: Negative for hearing loss.   Respiratory: Negative for cough and shortness of breath.   Cardiovascular: Negative for chest pain, palpitations and leg swelling.  Gastrointestinal: Positive for constipation. Negative for nausea.  Genitourinary: Positive for urgency.  Musculoskeletal: Negative for myalgias.  Psychiatric/Behavioral: Positive for memory loss.  All other systems reviewed and are negative.  Past Medical History:  Diagnosis Date  . BPH (benign prostatic hyperplasia)   . 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 Dementia   . Hypertension   . Left ventricular diastolic dysfunction, NYHA class 1   . Mental disorder    alzhiemers  . Syncope    Cardionet 04/14/12   Past Surgical History:  Procedure Laterality Date  . BREAST SURGERY     left tumor  . PARS PLANA VITRECTOMY  12/19/2010   Procedure: PARS PLANA VITRECTOMY WITH INTRAOCULAR LENS;  Surgeon: Shade Flood, MD;  Location: [facility] OR;  Service:  Ophthalmology;  Laterality: Right;  PARS PLANA VITRECTOMY WITH INTRAOCULAR LENS [900]membrane peel,endolaser right eye   Family History  Problem Relation Age of Onset  . Cancer Sister    Social History:  reports that he quit smoking about 29 years ago. he has never used smokeless tobacco. He reports that he does not drink alcohol or use drugs. Allergies: No Known Allergies Medications Prior to Admission  Medication Sig Dispense Refill  . acidophilus (RISAQUAD) CAPS capsule Take 1 capsule by mouth every other day.    . cholecalciferol (VITAMIN D) 1000 units tablet Take 1,000 Units by mouth every other day.    . diltiazem (CARTIA XT) 120 MG 24 hr capsule Take 120 mg by mouth daily.    . dorzolamide-timolol (COSOPT) 22.3-6.8 MG/ML ophthalmic solution Place 1 drop into both eyes 2 (two) times daily.     Marland Kitchen lactose free nutrition (BOOST) LIQD Take 237 mLs by mouth every other day.     . latanoprost (XALATAN) 0.005 % ophthalmic solution Place 1 drop into both eyes at bedtime.    . Multiple Vitamins-Minerals (CENTRUM PO) Take 1 tablet by mouth every other day.       Home: Home Living Family/patient expects to be discharged to:: Private residence Living Arrangements: Spouse/significant other Available Help at Discharge: Family Type of Home: House Home Access: Stairs to enter Entrance Stairs-Rails: Right Home Layout: One level Bathroom Shower/Tub: Engineer, manufacturing systems: Standard Home Equipment: Information systems manager, Grab bars - tub/shower Additional Comments: home setup from prior admission and function from RN reported by family  Functional History: Prior Function Level of Independence: Independent Comments: lives with  wife, daughter helps with bills and checks on parents Functional Status:  Mobility: Bed Mobility Overal bed mobility: Needs Assistance Bed Mobility: Supine to Sit Supine to sit: Min assist, +2 for physical assistance, HOB elevated General bed mobility comments: cues  to initiate with assist to move legs to edge and elevate trunk Transfers Overall transfer level: Needs assistance Transfers: Sit to/from Stand, Stand Pivot Transfers Sit to Stand: Mod assist, +2 safety/equipment, +2 physical assistance Stand pivot transfers: Max assist, +2 physical assistance, +2 safety/equipment General transfer comment: mod assist to stand from bed, chair and BSC with cues for hand placement, pt unable to scoot forward first and stands from back on surface. Pt with assist to rise, extend hips and achieve anterior translation. With pivot max assist to move hand from surface to surface, control hips and pivot to chair with multimodal cues Ambulation/Gait Ambulation/Gait assistance: Max assist, +2 physical assistance Ambulation Distance (Feet): 2 Feet Assistive device: 2 person hand held assist Gait Pattern/deviations: Narrow base of support, Scissoring General Gait Details: pt with posterior lean with narrow BOS and scissoring with bil UE assist to maintain balance and posture x 3' with assist to bring chair to pt as he could not step backward. Attempted RW for 2nd trial but pt maintains increased flexed posture with RW too anterior and only took 1 step with chair pulled to him.  Gait velocity interpretation: Below normal speed for age/gender    ADL:    Cognition: Cognition Overall Cognitive Status: No family/caregiver present to determine baseline cognitive functioning Orientation Level: Oriented to person Cognition Arousal/Alertness: Awake/alert Behavior During Therapy: Flat affect Overall Cognitive Status: No family/caregiver present to determine baseline cognitive functioning Area of Impairment: Orientation, Attention, Memory, Following commands, Safety/judgement Orientation Level: Disoriented to, Time, Situation, Place Current Attention Level: Sustained Memory: Decreased short-term memory, Decreased recall of precautions Following Commands: Follows one step  commands inconsistently, Follows one step commands with increased time Safety/Judgement: Decreased awareness of safety, Decreased awareness of deficits  Blood pressure 114/65, pulse 85, temperature 98.1 F (36.7 C), temperature source Oral, resp. rate (!) 24, height 5\' 8"  (1.727 m), weight 47 kg (103 lb 9.9 oz), SpO2 95 %. Physical Exam  Vitals reviewed. Constitutional:  84 year old right-handed male sitting up in chair  HENT:  Head: Normocephalic.  Eyes:  Pupils reactive to light  Neck: Normal range of motion. Neck supple. No thyromegaly present.  Cardiovascular: Normal rate, regular rhythm and normal heart sounds.  Respiratory: Effort normal and breath sounds normal. No respiratory distress.  GI: Soft. Bowel sounds are normal. He exhibits no distension.  Neurological:  Alert. Makes eye contact. Thought he was at "home". He appears to have a bit of a right gaze preference. He did provide his name and age. Followed simple commands. LUE 2-3/5 prox to distal. LLE: 3/5. Some left inattention. RUE and RLE 3 to 3+/5. Minimal withdrawal in pain in all 4 limbs.   Skin: Skin is warm and dry.  Psychiatric:  flat    Results for orders placed or performed during the hospital encounter of 02/27/17 (from the past 24 hour(s))  Comprehensive metabolic panel     Status: Abnormal   Collection Time: 02/27/17  6:50 PM  Result Value Ref Range   Sodium 130 (L) 135 - 145 mmol/L   Potassium 3.8 3.5 - 5.1 mmol/L   Chloride 94 (L) 101 - 111 mmol/L   CO2 21 (L) 22 - 32 mmol/L   Glucose, Bld 103 (H) 65 - 99 mg/dL  BUN 20 6 - 20 mg/dL   Creatinine, Ser 1.61 (H) 0.61 - 1.24 mg/dL   Calcium 8.9 8.9 - 09.6 mg/dL   Total Protein 7.4 6.5 - 8.1 g/dL   Albumin 3.2 (L) 3.5 - 5.0 g/dL   AST 71 (H) 15 - 41 U/L   ALT 18 17 - 63 U/L   Alkaline Phosphatase 97 38 - 126 U/L   Total Bilirubin 0.7 0.3 - 1.2 mg/dL   GFR calc non Af Amer 46 (L) >60 mL/min   GFR calc Af Amer 54 (L) >60 mL/min   Anion gap 15 5 - 15    CBC with Differential     Status: Abnormal   Collection Time: 02/27/17  6:50 PM  Result Value Ref Range   WBC 6.8 4.0 - 10.5 K/uL   RBC 3.73 (L) 4.22 - 5.81 MIL/uL   Hemoglobin 10.0 (L) 13.0 - 17.0 g/dL   HCT 04.5 (L) 40.9 - 81.1 %   MCV 80.7 78.0 - 100.0 fL   MCH 26.8 26.0 - 34.0 pg   MCHC 33.2 30.0 - 36.0 g/dL   RDW 91.4 78.2 - 95.6 %   Platelets 65 (L) 150 - 400 K/uL   Neutrophils Relative % 89 %   Lymphocytes Relative 7 %   Monocytes Relative 4 %   Eosinophils Relative 0 %   Basophils Relative 0 %   Neutro Abs 6.0 1.7 - 7.7 K/uL   Lymphs Abs 0.5 (L) 0.7 - 4.0 K/uL   Monocytes Absolute 0.3 0.1 - 1.0 K/uL   Eosinophils Absolute 0.0 0.0 - 0.7 K/uL   Basophils Absolute 0.0 0.0 - 0.1 K/uL   WBC Morphology MILD LEFT SHIFT (1-5% METAS, OCC MYELO, OCC BANDS)   Blood gas, arterial     Status: Abnormal   Collection Time: 02/27/17  6:55 PM  Result Value Ref Range   O2 Content 5.0 L/min   Delivery systems NASAL CANNULA    pH, Arterial 7.499 (H) 7.350 - 7.450   pCO2 arterial 28.4 (L) 32.0 - 48.0 mmHg   pO2, Arterial 59.7 (L) 83.0 - 108.0 mmHg   Bicarbonate 21.9 20.0 - 28.0 mmol/L   Acid-base deficit 0.3 0.0 - 2.0 mmol/L   O2 Saturation 90.8 %   Patient temperature 98.6    Collection site LEFT RADIAL    Drawn by [id number]    Sample type ARTERIAL DRAW    Allens test (pass/fail) PASS PASS  I-Stat CG4 Lactic Acid, ED     Status: Abnormal   Collection Time: 02/27/17  7:06 PM  Result Value Ref Range   Lactic Acid, Venous 3.73 (HH) 0.5 - 1.9 mmol/L   Comment NOTIFIED PHYSICIAN   Influenza panel by PCR (type A & B)     Status: None   Collection Time: 02/27/17  8:10 PM  Result Value Ref Range   Influenza A By PCR NEGATIVE NEGATIVE   Influenza B By PCR NEGATIVE NEGATIVE  I-Stat CG4 Lactic Acid, ED     Status: Abnormal   Collection Time: 02/27/17  8:19 PM  Result Value Ref Range   Lactic Acid, Venous 3.15 (HH) 0.5 - 1.9 mmol/L   Comment NOTIFIED PHYSICIAN   MRSA PCR Screening      Status: None   Collection Time: 02/27/17 11:14 PM  Result Value Ref Range   MRSA by PCR NEGATIVE NEGATIVE  Urinalysis, Routine w reflex microscopic     Status: Abnormal   Collection Time: 02/28/17  6:20 AM  Result Value  Ref Range   Color, Urine YELLOW YELLOW   APPearance CLOUDY (A) CLEAR   Specific Gravity, Urine 1.029 1.005 - 1.030   pH 5.0 5.0 - 8.0   Glucose, UA NEGATIVE NEGATIVE mg/dL   Hgb urine dipstick NEGATIVE NEGATIVE   Bilirubin Urine NEGATIVE NEGATIVE   Ketones, ur 5 (A) NEGATIVE mg/dL   Protein, ur 30 (A) NEGATIVE mg/dL   Nitrite NEGATIVE NEGATIVE   Leukocytes, UA NEGATIVE NEGATIVE   RBC / HPF 0-5 0 - 5 RBC/hpf   WBC, UA 0-5 0 - 5 WBC/hpf   Bacteria, UA FEW (A) NONE SEEN   Squamous Epithelial / LPF 0-5 (A) NONE SEEN   Mucus PRESENT    Hyaline Casts, UA PRESENT    Granular Casts, UA PRESENT   CBC     Status: Abnormal   Collection Time: 02/28/17  9:16 AM  Result Value Ref Range   WBC 7.4 4.0 - 10.5 K/uL   RBC 3.73 (L) 4.22 - 5.81 MIL/uL   Hemoglobin 9.7 (L) 13.0 - 17.0 g/dL   HCT 40.9 (L) 81.1 - 91.4 %   MCV 80.2 78.0 - 100.0 fL   MCH 26.0 26.0 - 34.0 pg   MCHC 32.4 30.0 - 36.0 g/dL   RDW 78.2 95.6 - 21.3 %   Platelets 75 (L) 150 - 400 K/uL  Basic metabolic panel     Status: Abnormal   Collection Time: 02/28/17  9:16 AM  Result Value Ref Range   Sodium 133 (L) 135 - 145 mmol/L   Potassium 3.4 (L) 3.5 - 5.1 mmol/L   Chloride 99 (L) 101 - 111 mmol/L   CO2 21 (L) 22 - 32 mmol/L   Glucose, Bld 92 65 - 99 mg/dL   BUN 19 6 - 20 mg/dL   Creatinine, Ser 0.86 0.61 - 1.24 mg/dL   Calcium 8.4 (L) 8.9 - 10.3 mg/dL   GFR calc non Af Amer 52 (L) >60 mL/min   GFR calc Af Amer 60 (L) >60 mL/min   Anion gap 13 5 - 15  Lipid panel     Status: Abnormal   Collection Time: 02/28/17  9:16 AM  Result Value Ref Range   Cholesterol 176 0 - 200 mg/dL   Triglycerides 578 <469 mg/dL   HDL 33 (L) >62 mg/dL   Total CHOL/HDL Ratio 5.3 RATIO   VLDL 23 0 - 40 mg/dL   LDL  Cholesterol 952 (H) 0 - 99 mg/dL  Hemoglobin W4X     Status: Abnormal   Collection Time: 02/28/17  9:16 AM  Result Value Ref Range   Hgb A1c MFr Bld 6.4 (H) 4.8 - 5.6 %   Mean Plasma Glucose 136.98 mg/dL  Vitamin L24     Status: None   Collection Time: 02/28/17  9:16 AM  Result Value Ref Range   Vitamin B-12 618 180 - 914 pg/mL  TSH     Status: None   Collection Time: 02/28/17  9:16 AM  Result Value Ref Range   TSH 2.003 0.350 - 4.500 uIU/mL   Ct Angio Head W Or Wo Contrast  Result Date: 02/28/2017 CLINICAL DATA:  Follow-up intracranial hemorrhage. History of Alzheimer's disease. EXAM: CT ANGIOGRAPHY HEAD TECHNIQUE: Multidetector CT imaging of the head was performed using the standard protocol during bolus administration of intravenous contrast. Multiplanar CT image reconstructions and MIPs were obtained to evaluate the vascular anatomy. CONTRAST:  50mL ISOVUE-370 IOPAMIDOL (ISOVUE-370) INJECTION 76% COMPARISON:  CT HEAD February 27, 2017 at 1944 hours  FINDINGS: CT HEAD BRAIN: Stable 3.3 x 2.4 x 2.7 cm RIGHT basal ganglia to thalamus intraparenchymal hemorrhage similar local mass effect with 5 mm RIGHT to LEFT midline shift. Intraventricular extension with layering blood products in occipital horns. Severe parenchymal brain volume loss most conspicuous within temporal lobes without hydrocephalus. Confluent supratentorial white matter hypodensities. No acute large vascular territory infarct. No abnormal extra-axial fluid collections. VASCULAR: Mild calcific atherosclerosis of the carotid siphons. SKULL: No skull fracture. No significant scalp soft tissue swelling. Frontal scalp scarring. SINUSES/ORBITS: Minimal frothy secretions RIGHT posterior ethmoid air cells. Subcentimeter LEFT maxillary mucosal retention. Trace LEFT mastoid effusion. The included ocular globes and orbital contents are non-suspicious. Status post bilateral ocular lens implants. OTHER: None. CTA HEAD ANTERIOR CIRCULATION: Patent  cervical internal carotid arteries, petrous, cavernous and supra clinoid internal carotid arteries. Patent anterior communicating artery. Patent anterior and middle cerebral arteries. Tandem severe stenosis bilateral ACA. Severe stenosis proximal M2 segment. No large vessel occlusion, contrast extravasation or aneurysm. POSTERIOR CIRCULATION: Patent vertebral arteries, vertebrobasilar junction and basilar artery, as well as main branch vessels. Patent posterior cerebral arteries. Moderate tandem stenoses bilateral posterior cerebral arteries. Symmetric severe stenosis bilateral P3 segments. No large vessel occlusion, contrast extravasation or aneurysm. VENOUS SINUSES: Major dural venous sinuses are patent though not tailored for evaluation on this angiographic examination. ANATOMIC VARIANTS: None. DELAYED PHASE: Not performed. MIP images reviewed. IMPRESSION: CT HEAD: 1. Stable evolving acute RIGHT basal ganglia/thalamus hematoma with intraventricular extension. No hydrocephalus. 2. Stable regional mass effect with 5 mm RIGHT to LEFT midline shift. 3. Stable severe atrophy and chronic small vessel ischemic disease. CTA HEAD: 1. No emergent large vessel occlusion, no aneurysm. 2. Intracranial atherosclerosis resulting in multifocal severe stenosis anterior and posterior circulation. Electronically Signed   By: Awilda Metro M.D.   On: 02/28/2017 01:41   Dg Chest 2 View  Result Date: 02/27/2017 CLINICAL DATA:  Cough and shortness of breath. EXAM: CHEST  2 VIEW COMPARISON:  02/23/2017 FINDINGS: The cardiac silhouette, mediastinal and hilar contours are within normal limits and stable. There is mild tortuosity of the thoracic aorta. Chronic underlying emphysema. New bilateral pleural effusions and left lower lobe infiltrate IMPRESSION: Left lower lobe pneumonia and bilateral pleural effusions. Electronically Signed   By: Rudie Meyer M.D.   On: 02/27/2017 19:34   Ct Head Wo Contrast  Result Date:  02/27/2017 CLINICAL DATA:  Head trauma, moderate to severe, low G CS. Fall on Wednesday evening with altered mental status. EXAM: CT HEAD WITHOUT CONTRAST TECHNIQUE: Contiguous axial images were obtained from the base of the skull through the vertex without intravenous contrast. COMPARISON:  02/23/2017 FINDINGS: Brain: 3.3 x 2.4 x 2.5 cm (~20cc) hematoma centered in the right basal ganglia, centered at the genu of the internal capsule and contacting the putamen, caudate head, anterior thalamus. There is intraventricular extension with layering hazy clot in the occipital horns. There is ventriculomegaly, but stable from prior and attributed to advanced brain atrophy. Remote lacunar infarct in the left caudate head. No evidence of cortical infarct. Hazy low-density in the periventricular white matter. Vascular: Atherosclerotic calcification. Skull: No acute or aggressive finding. Sinuses/Orbits: Bilateral cataract resection. Other: Critical Value/emergent results were called by telephone at the time of interpretation on 02/27/2017 at 8:02 pm to Dr. Charlestine Night , who verbally acknowledged these results. IMPRESSION: 1. ~20 cc hematoma centered in the right basal ganglia and internal capsule. There is intraventricular hemorrhage with blood layering in the occipital horns of lateral ventricles. 2. Severe brain  atrophy with chronic small vessel ischemia. Electronically Signed   By: Marnee SpringJonathon  Watts M.D.   On: 02/27/2017 20:02    Assessment/Plan: Diagnosis: Right basal ganglia/thalamic hemorrhage 1. Does the need for close, 24 hr/day medical supervision in concert with the patient's rehab needs make it unreasonable for this patient to be served in a less intensive setting? Yes and Potentially 2. Co-Morbidities requiring supervision/potential complications: dementia 3. Due to bladder management, bowel management, safety, skin/wound care, disease management, medication administration and patient education, does  the patient require 24 hr/day rehab nursing? Yes and Potentially 4. Does the patient require coordinated care of a physician, rehab nurse, PT (1-2 hrs/day, 5 days/week), OT (1-2 hrs/day, 5 days/week) and SLP (1-2 hrs/day, 5 days/week) to address physical and functional deficits in the context of the above medical diagnosis(es)? Yes Addressing deficits in the following areas: balance, endurance, locomotion, strength, transferring, bowel/bladder control, bathing, dressing, feeding, grooming, toileting, cognition, speech, language, swallowing and psychosocial support 5. Can the patient actively participate in an intensive therapy program of at least 3 hrs of therapy per day at least 5 days per week? Yes and Potentially 6. The potential for patient to make measurable gains while on inpatient rehab is potentially good 7. Anticipated functional outcomes upon discharge from inpatient rehab are supervision and min assist  with PT, supervision and min assist with OT, supervision and min assist with SLP. 8. Estimated rehab length of stay to reach the above functional goals is: potentially 17-24 days 9. Anticipated D/C setting: Home 10. Anticipated post D/C treatments: HH therapy and Outpatient therapy 11. Overall Rehab/Functional Prognosis: good  RECOMMENDATIONS: This patient's condition is appropriate for continued rehabilitative care in the following setting: potentially CIR. pt just admitted last night. will follow along as work up proceeds.  Patient has agreed to participate in recommended program. N/A Note that insurance prior authorization may be required for reimbursement for recommended care.  Comment:   Ranelle OysterZachary T. Blanchard Willhite, MD, [facility] CenterFAAPMR [facility] 02/28/2017    Mcarthur Rossettianiel J Angiulli, PA-C 02/28/2017

## 2017-02-28 NOTE — Progress Notes (Signed)
Initial Nutrition Assessment  DOCUMENTATION CODES:   Severe malnutrition in context of chronic illness  INTERVENTION:    Diet advancement as able after SLP RE-evaluation.  RD to add supplements when/as able (if advanced, pt does not like Ensure; drank Chocolate Boost PTA)    NUTRITION DIAGNOSIS:   Severe Malnutrition related to chronic illness(AMS) as evidenced by severe muscle depletion, severe fat depletion.   GOAL:   Patient will meet greater than or equal to 90% of their needs   MONITOR:   PO intake, Supplement acceptance, Diet advancement, Weight trends  REASON FOR ASSESSMENT:   Malnutrition Screening Tool    ASSESSMENT:   84 yo male admitted 2/14 with worsening AMS secondary to ICH, acute poor PO intake. PMH severe PCM, CKD3, CVD.   Per Neurology note 2/15:  -Pt mental status is somnolent, confused -Pt is NPO pending SLP RE-evaluation (currently moderate dysarthria) -Pt status will be re-assessed after MRI/doppler/echo  Palliative care consult pending  Pt is non-communicative. S/w pt's wife and daughter:  -Pt was eating "normally" until 2/10 -Over past couple weeks, pt lost appetite, only eating a couple bites of food/meal and occcasionally vomiting after eating -Wife denies any chewing/swallowing/self-feeding difficulties prior to ICH (Pt made breakfast for himself) -Per family, Pt has lost wt in past two weeks (UBW 113-115; 9% wt loss) Wt Readings from Last 10 Encounters:  02/27/17 103 lb 9.9 oz (47 kg)  02/23/17 115 lb (52.2 kg)  04/11/14 114 lb (51.7 kg)  10/20/13 108 lb (49 kg)  02/26/12 82 lb 14.4 oz (37.6 kg)  12/11/10 112 lb 3.4 oz (50.9 kg)     Dietary intake prior to AMS and ICH: Brunch: Protein (Bacon ,sausage, or eggs) and cereal Dinner: Meat (e.g. Chicken) with vegetables (broccoli) and starch (half of a dinner roll) Pt snacked all day in between meals (cookies)  Pt drank Chocolate Boost (partial container) occasionally at home. Wife  reported that pt does not like Ensure.  Medications include:  Senokot Protonix  Labs reviewed.   NUTRITION - FOCUSED PHYSICAL EXAM:    Most Recent Value  Orbital Region  Mild depletion  Upper Arm Region  Severe depletion  Thoracic and Lumbar Region  No depletion  Buccal Region  Mild depletion  Temple Region  Moderate depletion  Clavicle Bone Region  Severe depletion  Clavicle and Acromion Bone Region  Moderate depletion  Scapular Bone Region  Unable to assess  Dorsal Hand  Moderate depletion  Patellar Region  Severe depletion  Anterior Thigh Region  Severe depletion  Posterior Calf Region  Moderate depletion  Edema (RD Assessment)  None  Hair  Reviewed  Eyes  Reviewed  Mouth  Reviewed  Skin  Reviewed  Nails  Reviewed       Diet Order:  Diet NPO time specified Fall precautions  EDUCATION NEEDS:   Not appropriate for education at this time  Skin:  Skin Assessment: Reviewed RN Assessment  Last BM:  none noted since admission  Height:   Ht Readings from Last 1 Encounters:  02/27/17 5\' 8"  (1.727 m)    Weight:   Wt Readings from Last 1 Encounters:  02/27/17 103 lb 9.9 oz (47 kg)    Ideal Body Weight:  69.9 kg  BMI:  Body mass index is 15.75 kg/m.  Estimated Nutritional Needs:   Kcal:  1,500-1,700 kcal  Protein:  75-85 grams  Fluid:  1.5-1.7 L    Marjie Skiffherie N. Sundee Garland, MS Dietetic Intern Pager: [phone]

## 2017-02-28 NOTE — Progress Notes (Signed)
Rehab Admissions Coordinator Note:  Patient was screened by Clois DupesBoyette, Lahoma Constantin Godwin for appropriateness for an Inpatient Acute Rehab Consult per PT recommendation.   At this time, we are recommending Inpatient Rehab consult.  Clois DupesBoyette, Valery Amedee Godwin 02/28/2017, 1:13 PM  I can be reached at [phone]

## 2017-02-28 NOTE — Progress Notes (Signed)
PT Cancellation Note  Patient Details Name: Bradd CanaryWillie E Muhlestein MRN: [id number] DOB: 03-10-1924   Cancelled Treatment:    Reason Eval/Treat Not Completed: Active bedrest order   Kyli Sorter B Evelyn Moch 02/28/2017, 7:07 AM  Delaney MeigsMaija Tabor Michaeal Davis, PT [phone]

## 2017-02-28 NOTE — Progress Notes (Signed)
STROKE TEAM PROGRESS NOTE   SUBJECTIVE (INTERVAL HISTORY) His daughter and wife are at the bedside.  Overall he feels his condition is unchanged. He denies HA or discomfort. Orientated to self and people, not to place or time. Still has left facial droop but moving all extremities. CTA no AVM or aneurysm. Hematoma stable. MRI pending in am.    OBJECTIVE Temp:  [97.7 F (36.5 C)-101.3 F (38.5 C)] 98.1 F (36.7 C) (02/15 0800) Pulse Rate:  [81-99] 85 (02/15 1100) Cardiac Rhythm: Normal sinus rhythm (02/15 0400) Resp:  [18-32] 24 (02/15 1100) BP: (114-148)/(59-114) 123/68 (02/15 1100) SpO2:  [76 %-100 %] 95 % (02/15 1100) Weight:  [103 lb 9.9 oz (47 kg)] 103 lb 9.9 oz (47 kg) (02/14 2325)  Recent Labs  Lab 02/23/17 1151  GLUCAP 73   Recent Labs  Lab 02/23/17 1237 02/27/17 1850 02/28/17 0916  NA 130* 130* 133*  K 4.2 3.8 3.4*  CL 95* 94* 99*  CO2 26 21* 21*  GLUCOSE 95 103* 92  BUN 15 20 19   CREATININE 1.04 1.30* 1.19  CALCIUM 9.2 8.9 8.4*   Recent Labs  Lab 02/23/17 1237 02/27/17 1850  AST 45* 71*  ALT 12* 18  ALKPHOS 91 97  BILITOT 0.4 0.7  PROT 7.4 7.4  ALBUMIN 3.3* 3.2*   Recent Labs  Lab 02/23/17 1237 02/27/17 1850 02/28/17 0916  WBC 5.4 6.8 7.4  NEUTROABS 3.7 6.0  --   HGB 9.6* 10.0* 9.7*  HCT 29.4* 30.1* 29.9*  MCV 81.4 80.7 80.2  PLT 88* 65* 75*   No results for input(s): CKTOTAL, CKMB, CKMBINDEX, TROPONINI in the last 168 hours. No results for input(s): LABPROT, INR in the last 72 hours. Recent Labs    02/28/17 0620  COLORURINE YELLOW  LABSPEC 1.029  PHURINE 5.0  GLUCOSEU NEGATIVE  HGBUR NEGATIVE  BILIRUBINUR NEGATIVE  KETONESUR 5*  PROTEINUR 30*  NITRITE NEGATIVE  LEUKOCYTESUR NEGATIVE       Component Value Date/Time   CHOL 176 02/28/2017 0916   CHOL 189 04/11/2014 1007   TRIG 115 02/28/2017 0916   HDL 33 (L) 02/28/2017 0916   HDL 76 04/11/2014 1007   CHOLHDL 5.3 02/28/2017 0916   VLDL 23 02/28/2017 0916   LDLCALC 120 (H)  02/28/2017 0916   LDLCALC 101 (H) 04/11/2014 1007   Lab Results  Component Value Date   HGBA1C 6.4 (H) 02/28/2017      Component Value Date/Time   LABOPIA NONE DETECTED 07/23/2015 2101   COCAINSCRNUR NONE DETECTED 07/23/2015 2101   LABBENZ NONE DETECTED 07/23/2015 2101   AMPHETMU NONE DETECTED 07/23/2015 2101   THCU NONE DETECTED 07/23/2015 2101   LABBARB NONE DETECTED 07/23/2015 2101    No results for input(s): ETH in the last 168 hours.  I have personally reviewed the radiological images below and agree with the radiology interpretations.  Ct Angio Head W Or Wo Contrast  Result Date: 02/28/2017 CLINICAL DATA:  Follow-up intracranial hemorrhage. History of Alzheimer's disease. EXAM: CT ANGIOGRAPHY HEAD TECHNIQUE: Multidetector CT imaging of the head was performed using the standard protocol during bolus administration of intravenous contrast. Multiplanar CT image reconstructions and MIPs were obtained to evaluate the vascular anatomy. CONTRAST:  50mL ISOVUE-370 IOPAMIDOL (ISOVUE-370) INJECTION 76% COMPARISON:  CT HEAD February 27, 2017 at 1944 hours FINDINGS: CT HEAD BRAIN: Stable 3.3 x 2.4 x 2.7 cm RIGHT basal ganglia to thalamus intraparenchymal hemorrhage similar local mass effect with 5 mm RIGHT to LEFT midline shift. Intraventricular extension with  layering blood products in occipital horns. Severe parenchymal brain volume loss most conspicuous within temporal lobes without hydrocephalus. Confluent supratentorial white matter hypodensities. No acute large vascular territory infarct. No abnormal extra-axial fluid collections. VASCULAR: Mild calcific atherosclerosis of the carotid siphons. SKULL: No skull fracture. No significant scalp soft tissue swelling. Frontal scalp scarring. SINUSES/ORBITS: Minimal frothy secretions RIGHT posterior ethmoid air cells. Subcentimeter LEFT maxillary mucosal retention. Trace LEFT mastoid effusion. The included ocular globes and orbital contents are  non-suspicious. Status post bilateral ocular lens implants. OTHER: None. CTA HEAD ANTERIOR CIRCULATION: Patent cervical internal carotid arteries, petrous, cavernous and supra clinoid internal carotid arteries. Patent anterior communicating artery. Patent anterior and middle cerebral arteries. Tandem severe stenosis bilateral ACA. Severe stenosis proximal M2 segment. No large vessel occlusion, contrast extravasation or aneurysm. POSTERIOR CIRCULATION: Patent vertebral arteries, vertebrobasilar junction and basilar artery, as well as main branch vessels. Patent posterior cerebral arteries. Moderate tandem stenoses bilateral posterior cerebral arteries. Symmetric severe stenosis bilateral P3 segments. No large vessel occlusion, contrast extravasation or aneurysm. VENOUS SINUSES: Major dural venous sinuses are patent though not tailored for evaluation on this angiographic examination. ANATOMIC VARIANTS: None. DELAYED PHASE: Not performed. MIP images reviewed. IMPRESSION: CT HEAD: 1. Stable evolving acute RIGHT basal ganglia/thalamus hematoma with intraventricular extension. No hydrocephalus. 2. Stable regional mass effect with 5 mm RIGHT to LEFT midline shift. 3. Stable severe atrophy and chronic small vessel ischemic disease. CTA HEAD: 1. No emergent large vessel occlusion, no aneurysm. 2. Intracranial atherosclerosis resulting in multifocal severe stenosis anterior and posterior circulation. Electronically Signed   By: Cesar Metro M.D.   On: 02/28/2017 01:41   Dg Chest 2 View  Result Date: 02/27/2017 CLINICAL DATA:  Cough and shortness of breath. EXAM: CHEST  2 VIEW COMPARISON:  02/23/2017 FINDINGS: The cardiac silhouette, mediastinal and hilar contours are within normal limits and stable. There is mild tortuosity of the thoracic aorta. Chronic underlying emphysema. New bilateral pleural effusions and left lower lobe infiltrate IMPRESSION: Left lower lobe pneumonia and bilateral pleural effusions.  Electronically Signed   By: Cesar Meyer M.D.   On: 02/27/2017 19:34   Dg Chest 2 View  Result Date: 02/23/2017 CLINICAL DATA:  Weakness EXAM: CHEST  2 VIEW COMPARISON:  04/17/2016 FINDINGS: There is hyperinflation of the lungs compatible with COPD. Small bilateral effusions on the lateral view. No confluent airspace opacities. Heart is normal size. : Small bilateral pleural effusions.  COPD. Electronically Signed   By: Cesar Nose M.D.   On: 02/23/2017 13:01   Dg Abd 1 View  Result Date: 02/17/2017 CLINICAL DATA:  Periumbilical abdominal pain and cramping for the past 2 weeks. EXAM: ABDOMEN - 1 VIEW COMPARISON:  None. FINDINGS: The bowel gas pattern is normal. No radio-opaque calculi or other significant radiographic abnormality are seen. IMPRESSION: Negative. Electronically Signed   By: Cesar Dredge M.D.   On: 02/17/2017 13:30   Ct Head Wo Contrast  Result Date: 02/27/2017 CLINICAL DATA:  Head trauma, moderate to severe, low G CS. Fall on Wednesday evening with altered mental status. EXAM: CT HEAD WITHOUT CONTRAST TECHNIQUE: Contiguous axial images were obtained from the base of the skull through the vertex without intravenous contrast. COMPARISON:  02/23/2017 FINDINGS: Brain: 3.3 x 2.4 x 2.5 cm (~20cc) hematoma centered in the right basal ganglia, centered at the genu of the internal capsule and contacting the putamen, caudate head, anterior thalamus. There is intraventricular extension with layering hazy clot in the occipital horns. There is ventriculomegaly, but stable from  prior and attributed to advanced brain atrophy. Remote lacunar infarct in the left caudate head. No evidence of cortical infarct. Hazy low-density in the periventricular white matter. Vascular: Atherosclerotic calcification. Skull: No acute or aggressive finding. Sinuses/Orbits: Bilateral cataract resection. Other: Critical Value/emergent results were called by telephone at the time of interpretation on 02/27/2017 at 8:02 pm  to Cesar Maldonado , who verbally acknowledged these results. IMPRESSION: 1. ~20 cc hematoma centered in the right basal ganglia and internal capsule. There is intraventricular hemorrhage with blood layering in the occipital horns of lateral ventricles. 2. Severe brain atrophy with chronic small vessel ischemia. Electronically Signed   By: Cesar Spring M.D.   On: 02/27/2017 20:02   Ct Head Wo Contrast  Result Date: 02/23/2017 CLINICAL DATA:  84 year old male with altered level of consciousness. EXAM: CT HEAD WITHOUT CONTRAST TECHNIQUE: Contiguous axial images were obtained from the base of the skull through the vertex without intravenous contrast. COMPARISON:  07/23/2015 MR and prior studies FINDINGS: Brain: No evidence of acute infarction, hemorrhage, hydrocephalus, extra-axial collection or mass lesion/mass effect. Moderate to severe atrophy, chronic small-vessel white matter ischemic changes and remote left basal ganglia infarct are again noted. Vascular: Intracranial atherosclerotic calcifications identified. Skull: Normal. Negative for fracture or focal lesion. Sinuses/Orbits: No acute finding. Other: None IMPRESSION: 1. No evidence of acute intracranial abnormality 2. Atrophy, chronic small-vessel white matter ischemic changes and remote left basal ganglia infarct. Electronically Signed   By: Cesar Pier M.D.   On: 02/23/2017 14:16    Carotid Doppler  pending  TTE pending  MRI pending   PHYSICAL EXAM  Temp:  [97.7 F (36.5 C)-101.3 F (38.5 C)] 98.1 F (36.7 C) (02/15 0800) Pulse Rate:  [81-99] 85 (02/15 1100) Resp:  [18-32] 24 (02/15 1100) BP: (114-148)/(59-114) 123/68 (02/15 1100) SpO2:  [76 %-100 %] 95 % (02/15 1100) Weight:  [103 lb 9.9 oz (47 kg)] 103 lb 9.9 oz (47 kg) (02/14 2325)  General - Well nourished, well developed, in no apparent distress.  Ophthalmologic - fundi not visualized due to noncooperation.  Cardiovascular - Regular rate and rhythm.  Neuro -  sleepy but easily to arouse. Orientated to self, age and people, but not to time or place.  Following simple commands, paucity of speech, dysarthria.  Naming 1/2, able to repeat simple sentences.  PERRL, EOMI, blinking to visual threat bilaterally, left facial droop, moderate dysarthria, tongue midline.  Moving all extremities symmetrically and equally, at least 4/5.  DTR 1+, no Babinski.  Sensation symmetrical, coordination slow but intact.  Gait deferred.   ASSESSMENT/Maldonado Cesar Maldonado is a 84 y.o. male with history of dementia, HTN, remote lacunar infarcts on MRI, and BPH admitted for worsening confusion, decreased p.o. intake for 1-2 days. No tPA given due to ICH.    ICH:  right BG/thalamus ICH with IVH likely secondary to small vessel disease source  Resultant left facial droop  MRI  Pending in am  CTA head -no aneurysm or AVM, diffuse atherosclerosis including left M2, bilateral ACAs and bilateral distal PCAs  Carotid Doppler pending  2D Echo pending  LDL 120  HgbA1c 6.4  SCDs for VTE prophylaxis  Diet NPO time specified  Fall precautions   No antithrombotic prior to admission, now on No antithrombotic due to ICH  Ongoing aggressive stroke risk factor management  Therapy recommendations: Pending  Disposition: Pending  Dementia   History of mild dementia as per daughter  Able to perform most ADLs at home except  finance  Deficit on short-term memory  No behavior disturbance  Hypertension Stable BP goal < 160 On home cardizem  Hyperlipidemia  Home meds:  none   LDL 120, goal < 70  Consider low dose statin once passed swallow  Continue statin at discharge  Dysphagia  Moderate dysarthria  Failed bedside swallow screening  Speech on board  Consider to resume home meds and d/c IVF once passed swallow  Other Stroke Risk Factors  Advanced age  Hx stroke/TIA on imaging  Other Active Problems  Mild hypokalemia - supplement  Anemia of  chronic disease  Follows with Dr. Howard Pouch at [facility] day # 1  This patient is critically ill due to right BG ICH with IVH and at significant risk of neurological worsening, death form hematoma expansion, hydrocephalus, cerebral edema and brain herniation. This patient's care requires constant monitoring of vital signs, hemodynamics, respiratory and cardiac monitoring, review of multiple databases, neurological assessment, discussion with family, other specialists and medical decision making of high complexity. I spent 35 minutes of neurocritical care time in the care of this patient. I had long discussion with wife and daughter at bedside, updated pt current condition, treatment Maldonado and potential prognosis. They expressed understanding and appreciation.    Cesar Maldonado, Cesar Maldonado Stroke Neurology 02/28/2017 11:50 AM    To contact Stroke Continuity provider, please refer to [email or web address]. After hours, contact General Neurology

## 2017-02-28 NOTE — Progress Notes (Signed)
*  PRELIMINARY RESULTS* Vascular Ultrasound Carotid Duplex (Doppler) has been completed.   Findings suggest 1-39% internal carotid artery stenosis bilaterally. Vertebral arteries are patent with antegrade flow.  02/28/2017 1:59 PM Gertie FeyMichelle Nyla Creason, BS, RVT, RDCS, RDMS

## 2017-02-28 NOTE — Evaluation (Signed)
Physical Therapy Evaluation Patient Details Name: Cesar Maldonado MRN: [id number] DOB: [birth year] Today's Date: 02/28/2017   History of Present Illness  84 y.o. male PMHx: dementia, HTN and chronic white matter disease.  Admitted for worsening confusion and reduced food intake  CT showed acute right basal ganglia/thalamus hematoma with intraventricular extension  Clinical Impression  Pt pleasant with flat affect, pt stating he is "80". Pt unaware of place or situation able to follow single step commands with increased time. Pt with decreased strength bil LE, impaired balance, decreased coordination LLE, decreased transfers and gait with assist needed for all functional mobility who was living at home with spouse caring for himself without need for AD. Pt will benefit from acute therapy to maximize mobility, function, gait and strength to decrease burden of care and fall risk.   BP 114/65 sitting     Follow Up Recommendations CIR;Supervision/Assistance - 24 hour    Equipment Recommendations  3in1 (PT);Wheelchair (measurements PT);Wheelchair cushion (measurements PT)    Recommendations for Other Services Speech consult;Rehab consult     Precautions / Restrictions Precautions Precautions: Fall      Mobility  Bed Mobility Overal bed mobility: Needs Assistance Bed Mobility: Supine to Sit     Supine to sit: Min assist;+2 for physical assistance;HOB elevated     General bed mobility comments: cues to initiate with assist to move legs to edge and elevate trunk  Transfers Overall transfer level: Needs assistance   Transfers: Sit to/from Stand;Stand Pivot Transfers Sit to Stand: Mod assist;+2 safety/equipment;+2 physical assistance Stand pivot transfers: Max assist;+2 physical assistance;+2 safety/equipment       General transfer comment: mod assist to stand from bed, chair and BSC with cues for hand placement, pt unable to scoot forward first and stands from back on surface. Pt  with assist to rise, extend hips and achieve anterior translation. With pivot max assist to move hand from surface to surface, control hips and pivot to chair with multimodal cues  Ambulation/Gait Ambulation/Gait assistance: Max assist;+2 physical assistance Ambulation Distance (Feet): 2 Feet Assistive device: 2 person hand held assist Gait Pattern/deviations: Narrow base of support;Scissoring   Gait velocity interpretation: Below normal speed for age/gender General Gait Details: pt with posterior lean with narrow BOS and scissoring with bil UE assist to maintain balance and posture x 3' with assist to bring chair to pt as he could not step backward. Attempted RW for 2nd trial but pt maintains increased flexed posture with RW too anterior and only took 1 step with chair pulled to him.   Stairs            Wheelchair Mobility    Modified Rankin (Stroke Patients Only) Modified Rankin (Stroke Patients Only) Pre-Morbid Rankin Score: Slight disability Modified Rankin: Severe disability     Balance Overall balance assessment: Needs assistance   Sitting balance-Leahy Scale: Poor Sitting balance - Comments: left lean in sitting Postural control: Posterior lean;Left lateral lean Standing balance support: Bilateral upper extremity supported Standing balance-Leahy Scale: Zero Standing balance comment: bil UE support and physical assist to maintain balance                             Pertinent Vitals/Pain Pain Assessment: No/denies pain    Home Living Family/patient expects to be discharged to:: Private residence Living Arrangements: Spouse/significant other Available Help at Discharge: Family Type of Home: House Home Access: Stairs to enter Entrance Stairs-Rails: Right   Home Layout:  One level Home Equipment: Shower seat;Grab bars - tub/shower Additional Comments: home setup from prior admission and function from RN reported by family    Prior Function Level of  Independence: Independent         Comments: lives with wife, daughter helps with bills and checks on parents     Hand Dominance        Extremity/Trunk Assessment   Upper Extremity Assessment Upper Extremity Assessment: Defer to OT evaluation    Lower Extremity Assessment Lower Extremity Assessment: Generalized weakness;LLE deficits/detail LLE Deficits / Details: grossly 3/5, functionally, difficulty controlling and advancing with gait    Cervical / Trunk Assessment Cervical / Trunk Assessment: Kyphotic  Communication   Communication: No difficulties  Cognition Arousal/Alertness: Awake/alert Behavior During Therapy: Flat affect Overall Cognitive Status: No family/caregiver present to determine baseline cognitive functioning Area of Impairment: Orientation;Attention;Memory;Following commands;Safety/judgement                 Orientation Level: Disoriented to;Time;Situation;Place Current Attention Level: Sustained Memory: Decreased short-term memory;Decreased recall of precautions Following Commands: Follows one step commands inconsistently;Follows one step commands with increased time Safety/Judgement: Decreased awareness of safety;Decreased awareness of deficits            General Comments      Exercises     Assessment/Plan    PT Assessment Patient needs continued PT services  PT Problem List Decreased strength;Decreased mobility;Decreased safety awareness;Decreased activity tolerance;Decreased cognition;Decreased balance;Decreased knowledge of use of DME;Decreased coordination       PT Treatment Interventions Gait training;Therapeutic exercise;Patient/family education;Functional mobility training;Balance training;Neuromuscular re-education;DME instruction;Therapeutic activities;Cognitive remediation    PT Goals (Current goals can be found in the Care Plan section)  Acute Rehab PT Goals Patient Stated Goal: return home to eat and sleep PT Goal  Formulation: Patient unable to participate in goal setting Time For Goal Achievement: 03/07/17 Potential to Achieve Goals: Fair    Frequency Min 4X/week   Barriers to discharge Decreased caregiver support      Co-evaluation PT/OT/SLP Co-Evaluation/Treatment: Yes Reason for Co-Treatment: Complexity of the patient's impairments (multi-system involvement);For patient/therapist safety PT goals addressed during session: Mobility/safety with mobility;Balance;Proper use of DME         AM-PAC PT "6 Clicks" Daily Activity  Outcome Measure Difficulty turning over in bed (including adjusting bedclothes, sheets and blankets)?: A Lot Difficulty moving from lying on back to sitting on the side of the bed? : Unable Difficulty sitting down on and standing up from a chair with arms (e.g., wheelchair, bedside commode, etc,.)?: Unable Help needed moving to and from a bed to chair (including a wheelchair)?: A Lot Help needed walking in hospital room?: A Lot Help needed climbing 3-5 steps with a railing? : Total 6 Click Score: 9    End of Session Equipment Utilized During Treatment: Gait belt Activity Tolerance: Patient tolerated treatment well Patient left: in chair;with call bell/phone within reach;with chair alarm set Nurse Communication: Mobility status;Precautions PT Visit Diagnosis: Other abnormalities of gait and mobility (R26.89);Unsteadiness on feet (R26.81);Muscle weakness (generalized) (M62.81)    Time: [id number] PT Time Calculation (min) (ACUTE ONLY): 27 min   Charges:   PT Evaluation $PT Eval Moderate Complexity: 1 Mod     PT G Codes:        Delaney MeigsMaija Tabor Suheily Birks, PT [phone]   Erminie Foulks B Alyra Patty 02/28/2017, 12:53 PM

## 2017-02-28 NOTE — Progress Notes (Addendum)
  Echocardiogram 2D Echocardiogram has been performed.  Extremely difficult exam due to patient rib spacing.  Cesar Maldonado 02/28/2017, 2:18 PM

## 2017-02-28 NOTE — Evaluation (Signed)
Occupational Therapy Evaluation Patient Details Name: Cesar Maldonado MRN: [id number] DOB: [birth year] Today's Date: 02/28/2017    History of Present Illness [age] y.o. male PMHx: dementia, HTN and chronic white matter disease.  Admitted for worsening confusion and reduced food intake  CT showed acute right basal ganglia/thalamus hematoma with intraventricular extension   Clinical Impression   This 84 yo male admitted with above presents to acute OT with decreased mobility, decreased balance, decreased use of left side, and slow to respond all affecting his PLOF of being able to get around by himself and do most of his basic ADLs with S. He will benefit from acute OT with follow up OT on CIR.    Follow Up Recommendations  CIR;Supervision/Assistance - 24 hour    Equipment Recommendations  Other (comment)(TBD at next venue)       Precautions / Restrictions Precautions Precautions: Fall Restrictions Weight Bearing Restrictions: No      Mobility Bed Mobility Overal bed mobility: Needs Assistance Bed Mobility: Supine to Sit     Supine to sit: Min assist;+2 for physical assistance;HOB elevated     General bed mobility comments: cues to initiate with assist to move legs to edge and elevate trunk  Transfers Overall transfer level: Needs assistance   Transfers: Sit to/from Stand;Stand Pivot Transfers Sit to Stand: Mod assist;+2 safety/equipment;+2 physical assistance Stand pivot transfers: Max assist;+2 physical assistance;+2 safety/equipment       General transfer comment: mod assist to stand from bed, chair and BSC with cues for hand placement, pt unable to scoot forward first and stands from back on surface. Pt with assist to rise, extend hips and achieve anterior translation. With pivot max assist to move hand from surface to surface, control hips and pivot to chair with multimodal cues    Balance Overall balance assessment: Needs assistance Sitting-balance support: Bilateral  upper extremity supported;Feet supported Sitting balance-Leahy Scale: Poor Sitting balance - Comments: left lean in sitting Postural control: Posterior lean;Left lateral lean Standing balance support: Bilateral upper extremity supported Standing balance-Leahy Scale: Zero Standing balance comment: bil UE support and physical assist to maintain balance                           ADL either performed or assessed with clinical judgement   ADL Overall ADL's : Needs assistance/impaired Eating/Feeding: Total assistance   Grooming: Moderate assistance;Wash/dry face Grooming Details (indicate cue type and reason): supported sitting Upper Body Bathing: Maximal assistance Upper Body Bathing Details (indicate cue type and reason): supported sitting Lower Body Bathing: Total assistance Lower Body Bathing Details (indicate cue type and reason): Mod A +2 sit<>stand Upper Body Dressing : Total assistance Upper Body Dressing Details (indicate cue type and reason): supported sitting Lower Body Dressing: Total assistance Lower Body Dressing Details (indicate cue type and reason): Mod A +2 sit<>stand Toilet Transfer: Maximal assistance;+2 for safety/equipment;Stand-pivot;BSC   Toileting- Clothing Manipulation and Hygiene: Total assistance Toileting - Clothing Manipulation Details (indicate cue type and reason): Mod A +2 sit<>stand             Vision Baseline Vision/History: No visual deficits Additional Comments: To be further tested            Pertinent Vitals/Pain Pain Assessment: No/denies pain     Hand Dominance Right   Extremity/Trunk Assessment Upper Extremity Assessment Upper Extremity Assessment: RUE deficits/detail;LUE deficits/detail RUE Deficits / Details: genrealized weakness LUE Coordination: decreased fine motor;decreased gross motor  Communication Communication Communication: No difficulties   Cognition Arousal/Alertness: Awake/alert Behavior  During Therapy: Flat affect Overall Cognitive Status: No family/caregiver present to determine baseline cognitive functioning Area of Impairment: Orientation;Attention;Memory;Following commands;Safety/judgement                 Orientation Level: Disoriented to;Time;Situation;Place Current Attention Level: Sustained Memory: Decreased short-term memory;Decreased recall of precautions Following Commands: Follows one step commands inconsistently;Follows one step commands with increased time Safety/Judgement: Decreased awareness of safety;Decreased awareness of deficits                    Home Living Family/patient expects to be discharged to:: Skilled nursing facility Living Arrangements: Spouse/significant other Available Help at Discharge: Family Type of Home: House Home Access: Stairs to enter Secretary/administrator of Steps: 2 Entrance Stairs-Rails: Right Home Layout: One level     Bathroom Shower/Tub: Chief Strategy Officer: Standard     Home Equipment: Shower seat;Grab bars - tub/shower   Additional Comments: home setup from prior admission and function from RN reported by family      Prior Functioning/Environment Level of Independence: Independent        Comments: lives with wife, daughter helps with bills and checks on parents        OT Problem List: Decreased strength;Decreased range of motion;Impaired balance (sitting and/or standing);Decreased coordination;Decreased safety awareness      OT Treatment/Interventions: Self-care/ADL training;Balance training;Visual/perceptual remediation/compensation;Patient/family education;Neuromuscular education;Therapeutic activities;Therapeutic exercise    OT Goals(Current goals can be found in the care plan section) Acute Rehab OT Goals Patient Stated Goal: return home to eat and sleep OT Goal Formulation: With patient Time For Goal Achievement: 03/14/17 Potential to Achieve Goals: Good  OT  Frequency: Min 3X/week           Co-evaluation PT/OT/SLP Co-Evaluation/Treatment: Yes Reason for Co-Treatment: Complexity of the patient's impairments (multi-system involvement);For patient/therapist safety   OT goals addressed during session: ADL's and self-care;Strengthening/ROM      AM-PAC PT "6 Clicks" Daily Activity     Outcome Measure Help from another person eating meals?: Total Help from another person taking care of personal grooming?: A Lot Help from another person toileting, which includes using toliet, bedpan, or urinal?: Total Help from another person bathing (including washing, rinsing, drying)?: A Lot Help from another person to put on and taking off regular upper body clothing?: Total Help from another person to put on and taking off regular lower body clothing?: Total 6 Click Score: 8   End of Session Equipment Utilized During Treatment: Gait belt Nurse Communication: Mobility status  Activity Tolerance: Patient tolerated treatment well Patient left: in chair;with call bell/phone within reach;with chair alarm set  OT Visit Diagnosis: Unsteadiness on feet (R26.81);Other abnormalities of gait and mobility (R26.89);Muscle weakness (generalized) (M62.81);Hemiplegia and hemiparesis Hemiplegia - Right/Left: Left Hemiplegia - dominant/non-dominant: Non-Dominant Hemiplegia - caused by: Other Nontraumatic intracranial hemorrhage                Time: 1209-1238 OT Time Calculation (min): 29 min Charges:  OT General Charges $OT Visit: 1 Visit OT Evaluation $OT Eval Moderate Complexity: [address], [place] [phone] 02/28/2017

## 2017-02-28 NOTE — Evaluation (Signed)
Clinical/Bedside Swallow Evaluation Patient Details  Name: Cesar Maldonado MRN: [id number] Date of Birth: 03/15/1924  Today's Date: 02/28/2017 Time: SLP Start Time (ACUTE ONLY): 1514 SLP Stop Time (ACUTE ONLY): 1538 SLP Time Calculation (min) (ACUTE ONLY): 24 min  Past Medical History:  Past Medical History:  Diagnosis Date  . BPH (benign prostatic hyperplasia)   . 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 Dementia   . Hypertension   . Left ventricular diastolic dysfunction, NYHA class 1   . Mental disorder    alzhiemers  . Syncope    Cardionet 04/14/12   Past Surgical History:  Past Surgical History:  Procedure Laterality Date  . BREAST SURGERY     left tumor  . PARS PLANA VITRECTOMY  12/19/2010   Procedure: PARS PLANA VITRECTOMY WITH INTRAOCULAR LENS;  Surgeon: Shade FloodGreer Geiger, MD;  [facility];  Service: Ophthalmology;  Laterality: Right;  PARS PLANA VITRECTOMY WITH INTRAOCULAR LENS [900]membrane peel,endolaser right eye   HPI:  Pt is a 84 y.o.malepast medical history of dementia, hypertension and chronic white matter disease [facility].He was brought by family for worsening confusion and reduced food intake since last 1-2 days. Head CT was performed which showed acuterightbasal ganglia/thalamus hematoma with intraventricular extension. Chest x ray with left lower lobe pneumonia and bilateral pleural effusions.   Assessment / Plan / Recommendation Clinical Impression  Pt with reduced swallow sequencing this date in setting of dementia and acute CVA. Pt lethargic and required consistent cues, unable to consistently follow simple commands or adequately sustain attention. Trialed ice chips with oral holding, delayed oral transit and suspected delay in swallow initiation. 1/4 teaspoon of puree trialed in anterior portion of oral cavity  however poor bolus awareness noted from the pt and puree extracted from oral cavity. Recommend continue NPO with medicines via alternative means. Note upcoming palliative consult. Will follow for goals of care/family wishes.  SLP Visit Diagnosis: Dysphagia, unspecified (R13.10)    Aspiration Risk  Severe aspiration risk;Risk for inadequate nutrition/hydration    Diet Recommendation   NPO  Medication Administration: Via alternative means    Other  Recommendations Oral Care Recommendations: Oral care QID   Follow up Recommendations 24 hour supervision/assistance      Frequency and Duration min 1 x/week  1 week       Prognosis Prognosis for Safe Diet Advancement: Guarded Barriers to Reach Goals: Time post onset;Severity of deficits      Swallow Study   General Date of Onset: 02/27/17 HPI: Pt is a 84 y.o.malepast medical history of dementia, hypertension and chronic white matter disease [facility].He was brought by family for worsening confusion and reduced food intake since last 1-2 days. Head CT was performed which showed acuterightbasal ganglia/thalamus hematoma with intraventricular extension. Chest x ray with left lower lobe pneumonia and bilateral pleural effusions. Type of Study: Bedside Swallow Evaluation Diet Prior to this Study: NPO Temperature Spikes Noted: No Respiratory Status: Room air History of Recent Intubation: No Behavior/Cognition: Requires cueing;Lethargic/Drowsy Oral Cavity Assessment: (reduced management of saliva with secretions noted) Oral Care Completed by SLP: Yes Oral Cavity - Dentition: Poor condition;Missing dentition Vision: Impaired for self-feeding Self-Feeding Abilities: Total assist Patient Positioning: Upright in bed Baseline Vocal Quality: Low vocal intensity Volitional Cough: Cognitively unable to elicit Volitional Swallow: Unable to elicit  Oral/Motor/Sensory  Function Overall Oral Motor/Sensory Function: Other (comment)(pt unable to follow commands)   Ice Chips Ice chips: Impaired Presentation: Spoon Oral Phase Impairments: Reduced lingual movement/coordination;Poor awareness of bolus;Impaired mastication Oral Phase Functional Implications: Oral holding;Prolonged oral transit Pharyngeal Phase Impairments: Suspected delayed Swallow;Decreased hyoid-laryngeal movement   Thin Liquid Thin Liquid: Impaired Presentation: Cup Oral Phase Impairments: Reduced lingual movement/coordination Oral Phase Functional Implications: Prolonged oral transit Pharyngeal  Phase Impairments: Suspected delayed Swallow;Wet Vocal Quality    Nectar Thick Nectar Thick Liquid: Not tested   Honey Thick Honey Thick Liquid: Not tested   Puree Puree: Impaired Presentation: Spoon Oral Phase Impairments: Poor awareness of bolus Pharyngeal Phase Impairments: Unable to trigger swallow   Solid   GO   Solid: Not tested       Jeani Sow MA, CCC-SLP Acute Care Speech Language Pathologist    Ardis Rowan Guido Comp 02/28/2017,3:41 PM

## 2017-02-28 NOTE — Progress Notes (Signed)
I will follow up Monday with pt and family to assist with planning dispo pending his progress. [phone]

## 2017-03-01 LAB — BASIC METABOLIC PANEL
Anion gap: 14 (ref 5–15)
BUN: 26 mg/dL — AB (ref 6–20)
CO2: 19 mmol/L — AB (ref 22–32)
Calcium: 8.2 mg/dL — ABNORMAL LOW (ref 8.9–10.3)
Chloride: 101 mmol/L (ref 101–111)
Creatinine, Ser: 1.2 mg/dL (ref 0.61–1.24)
GFR calc Af Amer: 59 mL/min — ABNORMAL LOW (ref >60)
GFR, EST NON AFRICAN AMERICAN: 51 mL/min — AB (ref >60)
GLUCOSE: 84 mg/dL (ref 65–99)
POTASSIUM: 5.7 mmol/L — AB (ref 3.5–5.1)
Sodium: 134 mmol/L — ABNORMAL LOW (ref 135–145)

## 2017-03-01 LAB — CBC
HEMATOCRIT: 25.9 % — AB (ref 39.0–52.0)
Hemoglobin: 8.8 g/dL — ABNORMAL LOW (ref 13.0–17.0)
MCH: 27.2 pg (ref 26.0–34.0)
MCHC: 34 g/dL (ref 30.0–36.0)
MCV: 79.9 fL (ref 78.0–100.0)
PLATELETS: 92 10*3/uL — AB (ref 150–400)
RBC: 3.24 MIL/uL — AB (ref 4.22–5.81)
RDW: 14.1 % (ref 11.5–15.5)
WBC: 6.8 10*3/uL (ref 4.0–10.5)

## 2017-03-01 NOTE — Progress Notes (Signed)
Received report re:  [place]  earlier this pm.  Patient received to room 3W20 and placed on telemetry.  Bed placed in low position and call light is within reach.  Family not present at this time.  Patient is emaciated and states feels "dry".  PO care done and is required frequently.  Does respond appropriately at times in one word responses.  Otherwise, is confused to time / situation.

## 2017-03-01 NOTE — Progress Notes (Signed)
STROKE TEAM PROGRESS NOTE   SUBJECTIVE (INTERVAL HISTORY) No family at bedside, no changes overnight   OBJECTIVE Temp:  [98.2 F (36.8 C)-99.3 F (37.4 C)] 99.3 F (37.4 C) (02/16 1200) Pulse Rate:  [84-97] 84 (02/16 0800) Cardiac Rhythm: Normal sinus rhythm (02/16 0800) Resp:  [21-33] 28 (02/16 1600) BP: (108-141)/(62-85) 138/80 (02/16 1600) SpO2:  [22 %-100 %] 100 % (02/16 0900)  Recent Labs  Lab 02/23/17 1151  GLUCAP 73   Recent Labs  Lab 02/23/17 1237 02/27/17 1850 02/28/17 0916 03/01/17 0246  NA 130* 130* 133* 134*  K 4.2 3.8 3.4* 5.7*  CL 95* 94* 99* 101  CO2 26 21* 21* 19*  GLUCOSE 95 103* 92 84  BUN 15 20 19  26*  CREATININE 1.04 1.30* 1.19 1.20  CALCIUM 9.2 8.9 8.4* 8.2*   Recent Labs  Lab 02/23/17 1237 02/27/17 1850  AST 45* 71*  ALT 12* 18  ALKPHOS 91 97  BILITOT 0.4 0.7  PROT 7.4 7.4  ALBUMIN 3.3* 3.2*   Recent Labs  Lab 02/23/17 1237 02/27/17 1850 02/28/17 0916 03/01/17 0246  WBC 5.4 6.8 7.4 6.8  NEUTROABS 3.7 6.0  --   --   HGB 9.6* 10.0* 9.7* 8.8*  HCT 29.4* 30.1* 29.9* 25.9*  MCV 81.4 80.7 80.2 79.9  PLT 88* 65* 75* 92*   No results for input(s): CKTOTAL, CKMB, CKMBINDEX, TROPONINI in the last 168 hours. No results for input(s): LABPROT, INR in the last 72 hours. Recent Labs    02/28/17 0620  COLORURINE YELLOW  LABSPEC 1.029  PHURINE 5.0  GLUCOSEU NEGATIVE  HGBUR NEGATIVE  BILIRUBINUR NEGATIVE  KETONESUR 5*  PROTEINUR 30*  NITRITE NEGATIVE  LEUKOCYTESUR NEGATIVE       Component Value Date/Time   CHOL 176 02/28/2017 0916   CHOL 189 04/11/2014 1007   TRIG 115 02/28/2017 0916   HDL 33 (L) 02/28/2017 0916   HDL 76 04/11/2014 1007   CHOLHDL 5.3 02/28/2017 0916   VLDL 23 02/28/2017 0916   LDLCALC 120 (H) 02/28/2017 0916   LDLCALC 101 (H) 04/11/2014 1007   Lab Results  Component Value Date   HGBA1C 6.4 (H) 02/28/2017      Component Value Date/Time   LABOPIA NONE DETECTED 07/23/2015 2101   COCAINSCRNUR NONE  DETECTED 07/23/2015 2101   LABBENZ NONE DETECTED 07/23/2015 2101   AMPHETMU NONE DETECTED 07/23/2015 2101   THCU NONE DETECTED 07/23/2015 2101   LABBARB NONE DETECTED 07/23/2015 2101    No results for input(s): ETH in the last 168 hours.   IMAGING  I have personally reviewed the radiological images below and agree with the radiology interpretations.  Ct Angio Head W Or Wo Contrast 02/28/2017 IMPRESSION:   CT HEAD:  1. Stable evolving acute RIGHT basal ganglia/thalamus hematoma with intraventricular extension.   No hydrocephalus.  2. Stable regional mass effect with 5 mm RIGHT to LEFT midline shift.  3. Stable severe atrophy and chronic small vessel ischemic disease.    CTA HEAD:  1. No emergent large vessel occlusion, no aneurysm.  2. Intracranial atherosclerosis resulting in multifocal severe stenosis anterior and posterior circulation.    Dg Chest 2 View 02/27/2017 IMPRESSION:  Left lower lobe pneumonia and bilateral pleural effusions.    Dg Chest 2 View 02/23/2017 Small bilateral pleural effusions.   COPD.     Dg Abd 1 View 02/17/2017 IMPRESSION:  Negative.    Ct Head Wo Contrast 02/27/2017 IMPRESSION:  1. ~20 cc hematoma centered in the right basal ganglia  and internal capsule. There is intraventricular hemorrhage with blood layering in the occipital horns of lateral ventricles.  2. Severe brain atrophy with chronic small vessel ischemia.     Ct Head Wo Contrast 02/23/2017 IMPRESSION:  1. No evidence of acute intracranial abnormality  2. Atrophy, chronic small-vessel white matter ischemic changes and remote left basal ganglia infarct.     Mr Brain Wo Contrast 03/01/2017 IMPRESSION:  1. Moderately motion degraded examination.  2. Evolving acute RIGHT basal ganglia/thalamus hematoma with similar mass effect, 4 mm RIGHT to LEFT midline shift without ventricular entrapment.  3. Small volume redistributed subarachnoid hemorrhage. Similar layering blood  products occipital horns. No hydrocephalus.  4. Severe parenchymal brain volume loss and chronic small vessel ischemic disease.  Old LEFT basal ganglia lacunar infarct.     Vas US Carotid 02/28/2017 Right Carotid: Velocities in the right ICA are consistent with a 1-39% stenosis.  Left Carotid: Velocities in the left ICA are consistent with a 1-39% stenosis.  Vertebrals: Both vertebral arteries were patent with antegrade flow.  Subclavians: Normal flow hemodynamics were seen in bilateral subclavian arteries.     Transthoracic Echocardiogram  02/28/2017 Study Conclusions - Left ventricle: Systolic function was normal. The estimated   ejection fraction was in the range of 55% to 60%. - Mitral valve: Calcified annulus. Moderately thickened, mildly   calcified leaflets . - Atrial septum: No defect or patent foramen ovale was identified. - Tricuspid valve: There was moderate regurgitation. - Pulmonary arteries: PA peak pressure: 50 mm Hg (S).    PHYSICAL EXAM  Temp:  [98.2 F (36.8 C)-99.3 F (37.4 C)] 99.3 F (37.4 C) (02/16 1200) Pulse Rate:  [84-97] 84 (02/16 0800) Resp:  [21-33] 28 (02/16 1600) BP: (108-141)/(62-85) 138/80 (02/16 1600) SpO2:  [22 %-100 %] 100 % (02/16 0900)  General - Well nourished, well developed, in no apparent distress.  Ophthalmologic - fundi not visualized due to noncooperation.  Cardiovascular - Regular rate and rhythm.  Neuro - sleepy but easily to arouse. Orientated to self, age and people, but not to time or place.  Following simple commands, paucity of speech, dysarthria.  Naming 1/2, able to repeat simple sentences.  PERRL, EOMI, blinking to visual threat bilaterally, left facial droop, moderate dysarthria, tongue midline.  Moving all extremities symmetrically and equally, at least 4/5.  DTR 1+, no Babinski.  Sensation symmetrical, coordination slow but intact.  Gait deferred.   ASSESSMENT/PLAN Mr. MOHMED FARVER is a 84 y.o. male with history  of dementia, HTN, remote lacunar infarcts on MRI, and BPH admitted for worsening confusion, decreased p.o. intake for 1-2 days. No tPA given due to ICH.    ICH:  right BG/thalamus ICH with IVH likely secondary to small vessel disease source  Resultant left facial droop  MRI - Rt BG hematoma - 4 mm Rt to Lt shift - small SAH - old LEFT basal ganglia lacunar infarct.   CTA head -no aneurysm or AVM, diffuse atherosclerosis including left M2, bilateral ACAs and bilateral distal PCAs  Carotid Doppler - unremarkable  2D Echo - EF 55-60%. No cardiac source of emboli identified.  LDL 120  HgbA1c 6.4  SCDs for VTE prophylaxis Diet NPO time specified  Fall precautions   No antithrombotic prior to admission, now on No antithrombotic due to ICH  Ongoing aggressive stroke risk factor management  Therapy recommendations: CIR recommended. Rehabilitation M.D. consult on chart.  Disposition: Pending  Dementia   History of mild dementia as per daughter  Able  to perform most ADLs at home except finance  Deficit on short-term memory  No behavior disturbance  Hypertension Stable BP goal < 160 On home cardizem  Hyperlipidemia  Home meds:  none   LDL 120, goal < 70  Consider low dose statin once he passes swallowing evaluation. (still NPO)  Continue statin at discharge  Dysphagia  Moderate dysarthria  Failed bedside swallow screening  Speech on board  Consider resuming home meds and d/c IVF once he passes swallowing evaluation. (still NPO)  Other Stroke Risk Factors  Advanced age  Hx stroke/TIA on imaging  Other Active Problems  Mild hypokalemia - supplement -> hyperkalemia - 5.7 - recheck in AM  Anemia of chronic disease - recheck in AM  Left lower lobe pneumonia by chest x-ray - WBCs 6.8 - afebrile - repeat CXR tomorrow.  Blood cultures no growth 2 days  Palliative care consult on chart   PLAN  [facility]    Hospital day #  2  This patient is critically ill due to right BG ICH with IVH and at significant risk of neurological worsening, death form hematoma expansion, hydrocephalus, cerebral edema and brain herniation. This patient's care requires constant monitoring of vital signs, hemodynamics, respiratory and cardiac monitoring, review of multiple databases, neurological assessment, discussion with family, other specialists and medical decision making of high complexity. I spent 35 minutes of neurocritical care time in the care of this patient. I had long discussion with wife and daughter at bedside, updated pt current condition, treatment plan and potential prognosis. They expressed understanding and appreciation.       To contact Stroke Continuity provider, please refer to [email or web address]. After hours, contact General Neurology

## 2017-03-01 NOTE — Progress Notes (Signed)
  Speech Language Pathology Treatment: Dysphagia  Patient Details Name: Cesar Maldonado MRN: [id number] DOB: 1924-02-05 Today's Date: 03/01/2017 Time: [id number] SLP Time Calculation (min) (ACUTE ONLY): 10 min  Assessment / Plan / Recommendation Clinical Impression  Patient seen for follow-up for dysphagia to assess readiness for POs. He remains lethargic, keeping his eyes closed throughout session, though he does open them briefly with verbal cue. Follows simple commands, opening mouth and protruding his tongue on command to allow oral care. SLP attempted to facilitate initiation of swallow sequence, providing thermal, tactile stimulation with ice chips to lips, anterior tongue. Pt makes minimal efforts to contain, and SLP suctioned to remove. With SLP assistance to bring cup, straw to lips, pt does close his lips but makes no efforts to retrieve bolus. At this time recommend he remain NPO; will continue to follow for PO readiness. No family present; d/w palliative NP, RN.     HPI HPI: Pt is a 84 y.o.malepast medical history of dementia, hypertension and chronic white matter disease [facility].He was brought by family for worsening confusion and reduced food intake since last 1-2 days. Head CT was performed which showed acuterightbasal ganglia/thalamus hematoma with intraventricular extension. Chest x ray with left lower lobe pneumonia and bilateral pleural effusions.      SLP Plan  Continue with current plan of care       Recommendations  Diet recommendations: NPO Medication Administration: Via alternative means                Oral Care Recommendations: Oral care QID Follow up Recommendations: Other (comment)(tbd) SLP Visit Diagnosis: Dysphagia, unspecified (R13.10) Plan: Continue with current plan of care       GO               Rondel BatonMary Beth Abdurahman Rugg, MS, CCC-SLP Speech-Language  Pathologist [phone]  Arlana LindauMary E Malissa Slay 03/01/2017, 11:51 AM

## 2017-03-01 NOTE — ED Provider Notes (Signed)
[facility] Provider Note   CSN: [id number] Arrival date & time: 02/27/17  1729     History   Chief Complaint Chief Complaint  Patient presents with  . Weakness    HPI Cesar Maldonado is a 84 y.o. male.  HPI Patient presents to the emergency department with increasing weakness and altered mental status that started on Wednesday.  The wife and daughter stated that he is also not been able to ambulate due to weakness and has been lying in bed asleep all day for most of the last 3 days.  Patient did take a fall in the bathroom on Wednesday night EMS was called and they did not bring him to the hospital.  He was recently seen in the ER on Monday for weakness.  Patient was given IV fluids and did seem to improve per the family.  Wife states he is also been having cough.  Accurate history about his symptoms due to his altered mental status. Past Medical History:  Diagnosis Date  . BPH (benign prostatic hyperplasia)   . 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 Dementia   . Hypertension   . Left ventricular diastolic dysfunction, NYHA class 1   . Mental disorder    alzhiemers  . Syncope    Cardionet 04/14/12    Patient Active Problem List   Diagnosis Date Noted  . ICH (intracerebral hemorrhage) (HCC) 02/28/2017  . Palliative care encounter   . 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 Protein-calorie malnutrition, severe (HCC) 10/23/2013  . Acute respiratory failure with hypoxia (HCC) 10/21/2013  . Bradycardia 10/21/2013  . CKD (chronic kidney disease), stage III (HCC) 10/21/2013  . Abnormal head CT 10/21/2013  . Syncope 10/20/2013  . CAP (community acquired pneumonia) 10/20/2013  . Dehydration 02/25/2012  . Syncope and collapse 02/24/2012  . Hypertension   . Dementia   . BPH (benign prostatic hyperplasia)   . Left ventricular diastolic dysfunction, NYHA class 1   .  Cerebrovascular disease     Past Surgical History:  Procedure Laterality Date  . BREAST SURGERY     left tumor  . PARS PLANA VITRECTOMY  12/19/2010   Procedure: PARS PLANA VITRECTOMY WITH INTRAOCULAR LENS;  Surgeon: Shade FloodGreer Geiger, MD;  [facility];  Service: Ophthalmology;  Laterality: Right;  PARS PLANA VITRECTOMY WITH INTRAOCULAR LENS [900]membrane peel,endolaser right eye       Home Medications    Prior to Admission medications   Medication Sig Start Date End Date Taking? Authorizing Provider  acidophilus (RISAQUAD) CAPS capsule Take 1 capsule by mouth every other day.   Yes [provider]  cholecalciferol (VITAMIN D) 1000 units tablet Take 1,000 Units by mouth every other day.   Yes [provider]  diltiazem (CARTIA XT) 120 MG 24 hr capsule Take 120 mg by mouth daily.   Yes [provider]  dorzolamide-timolol (COSOPT) 22.3-6.8 MG/ML ophthalmic solution Place 1 drop into both eyes 2 (two) times daily.    Yes [provider]  lactose free nutrition (BOOST) LIQD Take 237 mLs by mouth every other day.    Yes [provider]  latanoprost (XALATAN) 0.005 % ophthalmic solution Place 1 drop into both eyes at bedtime.   Yes [provider]  Multiple Vitamins-Minerals (CENTRUM PO) Take 1 tablet by mouth every other day.    Yes [provider]    Family History Family History  Problem Relation Age of Onset  . Cancer Sister     Social History Social History   Tobacco Use  . Smoking status: Former Smoker    Last attempt to quit: 01/15/1988    Years since quitting: 29.1  . Smokeless tobacco: Never Used  Substance Use Topics  . Alcohol use: No    Alcohol/week: 1.2 oz    Types: 2 Shots of liquor per week  . Drug use: No     Allergies   Patient has no known allergies.   Review of Systems Review of Systems Level 5 caveat applies due to altered mental status  Physical Exam Updated Vital Signs BP 138/80    Pulse 84   Temp 99.3 F (37.4 C) (Oral)   Resp (!) 28   Ht 5\' 8"  (1.727 m)   Wt 47 kg (103 lb 9.9 oz)   SpO2 100%   BMI 15.75 kg/m   Physical Exam  Constitutional: He is oriented to person, place, and time. He appears well-developed and well-nourished. No distress.  HENT:  Head: Normocephalic and atraumatic.  Mouth/Throat: Oropharynx is clear and moist.  Eyes: Pupils are equal, round, and reactive to light.  Neck: Normal range of motion. Neck supple.  Cardiovascular: Normal rate, regular rhythm and normal heart sounds. Exam reveals no gallop and no friction rub.  No murmur heard. Pulmonary/Chest: Effort normal. No respiratory distress. He has no wheezes. He has rhonchi.  Abdominal: Soft. Bowel sounds are normal. He exhibits no distension. There is no tenderness.  Neurological: He is alert and oriented to person, place, and time. He exhibits normal muscle tone. Coordination normal.  Skin: Skin is warm and dry. Capillary refill takes less than 2 seconds. No rash noted. No erythema.  Psychiatric: He has a normal mood and affect. His behavior is normal.  Nursing note and vitals reviewed.    ED Treatments / Results  Labs (all labs ordered are listed, but only abnormal results are displayed) Labs Reviewed  COMPREHENSIVE METABOLIC PANEL - Abnormal; Notable for the following components:      Result Value   Sodium 130 (*)    Chloride 94 (*)    CO2 21 (*)    Glucose, Bld 103 (*)    Creatinine, Ser 1.30 (*)    Albumin 3.2 (*)    AST 71 (*)    GFR calc non Af Amer 46 (*)    GFR calc Af Amer 54 (*)    All other components within normal limits  CBC WITH DIFFERENTIAL/PLATELET - Abnormal; Notable for the following components:   RBC 3.73 (*)    Hemoglobin 10.0 (*)    HCT 30.1 (*)    Platelets 65 (*)    Lymphs Abs 0.5 (*)    All other components within normal limits  BLOOD GAS, ARTERIAL - Abnormal; Notable for the following components:   pH, Arterial 7.499 (*)    pCO2 arterial 28.4  (*)    pO2, Arterial 59.7 (*)    All other components within normal limits  CBC - Abnormal; Notable for the following components:   RBC 3.73 (*)    Hemoglobin 9.7 (*)    HCT 29.9 (*)    Platelets 75 (*)    All other components within normal limits  BASIC METABOLIC PANEL - Abnormal; Notable for the following components:   Sodium 133 (*)    Potassium 3.4 (*)    Chloride 99 (*)    CO2 21 (*)    Calcium 8.4 (*)  GFR calc non Af Amer 52 (*)    GFR calc Af Amer 60 (*)    All other components within normal limits  LIPID PANEL - Abnormal; Notable for the following components:   HDL 33 (*)    LDL Cholesterol 120 (*)    All other components within normal limits  HEMOGLOBIN A1C - Abnormal; Notable for the following components:   Hgb A1c MFr Bld 6.4 (*)    All other components within normal limits  URINALYSIS, ROUTINE W REFLEX MICROSCOPIC - Abnormal; Notable for the following components:   APPearance CLOUDY (*)    Ketones, ur 5 (*)    Protein, ur 30 (*)    Bacteria, UA FEW (*)    Squamous Epithelial / LPF 0-5 (*)    All other components within normal limits  CBC - Abnormal; Notable for the following components:   RBC 3.24 (*)    Hemoglobin 8.8 (*)    HCT 25.9 (*)    Platelets 92 (*)    All other components within normal limits  BASIC METABOLIC PANEL - Abnormal; Notable for the following components:   Sodium 134 (*)    Potassium 5.7 (*)    CO2 19 (*)    BUN 26 (*)    Calcium 8.2 (*)    GFR calc non Af Amer 51 (*)    GFR calc Af Amer 59 (*)    All other components within normal limits  I-STAT CG4 LACTIC ACID, ED - Abnormal; Notable for the following components:   Lactic Acid, Venous 3.73 (*)    All other components within normal limits  I-STAT CG4 LACTIC ACID, ED - Abnormal; Notable for the following components:   Lactic Acid, Venous 3.15 (*)    All other components within normal limits  CULTURE, BLOOD (ROUTINE X 2)  MRSA PCR SCREENING  CULTURE, BLOOD (ROUTINE X 2)    INFLUENZA PANEL BY PCR (TYPE A & B)  VITAMIN B12  TSH  URINALYSIS, ROUTINE W REFLEX MICROSCOPIC    EKG  EKG Interpretation  Date/Time:  Thursday February 27 2017 18:33:47 EST Ventricular Rate:  87 PR Interval:    QRS Duration: 101 QT Interval:  373 QTC Calculation: 449 R Axis:   90 Text Interpretation:  Sinus rhythm Borderline right axis deviation Borderline T wave abnormalities No acute changes Confirmed by Derwood Kaplan [phone]) on 02/27/2017 7:21:42 PM Also confirmed by Derwood Kaplan [phone]), editor Elita Quick ([id number])  on 02/28/2017 7:23:36 AM       Radiology   Procedures Procedures (including critical care time)  Medications Ordered in ED Medications  acetaminophen (TYLENOL) tablet 650 mg (not administered)    Or  acetaminophen (TYLENOL) solution 650 mg (not administered)    Or  acetaminophen (TYLENOL) suppository 650 mg (not administered)  senna-docusate (Senokot-S) tablet 1 tablet (1 tablet Oral Not Given 03/01/17 0932)  pantoprazole (PROTONIX) injection 40 mg (40 mg Intravenous Not Given 02/28/17 2141)  labetalol (NORMODYNE,TRANDATE) injection 10 mg (not administered)  diltiazem (CARDIZEM CD) 24 hr capsule 240 mg (240 mg Oral Not Given 03/01/17 0931)  dorzolamide-timolol (COSOPT) 22.3-6.8 MG/ML ophthalmic solution 1 drop (1 drop Both Eyes Given 03/01/17 0931)  latanoprost (XALATAN) 0.005 % ophthalmic solution 1 drop (1 drop Both Eyes Given 02/28/17 2205)  0.9 %  sodium chloride infusion ( Intravenous New Bag/Given 03/01/17 0600)  chlorhexidine (PERIDEX) 0.12 % solution 15 mL (15 mLs Mouth Rinse Given 03/01/17 0931)  MEDLINE mouth rinse (15 mLs Mouth Rinse Given 03/01/17 1604)  sodium chloride 0.9 % bolus 1,000 mL (0 mLs Intravenous Stopped 02/27/17 2038)    And  sodium chloride 0.9 % bolus 500 mL (0 mLs Intravenous Stopped 02/27/17 2024)    And  sodium chloride 0.9 % bolus 250 mL (0 mLs Intravenous Stopped 02/27/17 2017)  ceFEPIme (MAXIPIME) 2 g in sodium  chloride 0.9 % 100 mL IVPB (0 g Intravenous Stopped 02/27/17 2039)   stroke: mapping our early stages of recovery book ( Does not apply Given 02/28/17 0100)  iopamidol (ISOVUE-370) 76 % injection (50 mLs  Contrast Given 02/28/17 0100)     Initial Impression / Assessment and Plan / ED Course  I have reviewed the triage vital signs and the nursing notes.  Pertinent labs & imaging results that were available during my care of the patient were reviewed by me and considered in my medical decision making (see chart for details).  Clinical Course as of Mar 01 1646  Thu Feb 27, 2017  2012 CT Head Wo Contrast [LC]    Clinical Course User Index [LC] Chyrl Civatte M, [place]   CRITICAL CARE Performed by: Jamesetta Orleans Florence Yeung Total critical care time: 45 minutes Critical care time was exclusive of separately billable procedures and treating other patients. Critical care was necessary to treat or prevent imminent or life-threatening deterioration. Critical care was time spent personally by me on the following activities: development of treatment plan with patient and/or surrogate as well as nursing, discussions with consultants, evaluation of patient's response to treatment, examination of patient, obtaining history from patient or surrogate, ordering and performing treatments and interventions, ordering and review of laboratory studies, ordering and review of radiographic studies, pulse oximetry and re-evaluation of patient's condition.  Patient has sepsis requiring multiple liters of fluid and IV antibiotics.  Along with an intracranial hemorrhage that is most likely due to hypertensive bleed.  I spoke with neurosurgery who believes that the are not able to offer any care to this patient as this appears to be more of a hypertensive bleeding area in the basal ganglia.  I then spoke with neurology and critical care.  The patient will be admitted to the neuro ICU.  Family is made aware of the plan and  all questions were answered. Final Clinical Impressions(s) / ED Diagnoses   Final diagnoses:  Basal ganglia hemorrhage (HCC)  Community acquired pneumonia of left lower lobe of lung [facility])    ED Discharge Orders        Ordered    Ambulatory referral to Neurology    Comments:  Pt will follow up with Dr. Lucia Gaskins at [facility] in about 2 months. Thanks.   02/28/17 1207       Charlestine Night, PA-C 03/01/17 1653    Derwood Kaplan, MD 03/01/17 1719

## 2017-03-01 NOTE — Consult Note (Signed)
Consultation Note Date: 03/01/2017   Patient Name: Cesar Maldonado  DOB: [birth year]  MRN: [id number]  Age / Sex: 84 y.o., male  PCP: Glendale Chard, MD Referring Physician: Rosalin Hawking, MD  Reason for Consultation: Establishing goals of care and Psychosocial/spiritual support  HPI/Patient Profile: 84 y.o. male  with past medical history of dementia, hypertension, admitted on 02/27/2017 with increased confusion, increased weakness, decreased oral intake.  Per CT scan, patient was found to have an acute right basal ganglia/thalamus infarct with intraventricular extension; significant white matter disease.  Consult ordered for goals of care.   Clinical Assessment and Goals of Care: Patient seen, chart reviewed.  Met briefly with patient's wife and daughter as well as discussed with speech therapy bedside evaluation.  Despite reassuring family that palliative medicine is not hospice they were visibly wary of meeting with me.   Patient's wife, Cesar Maldonado, would be patient's healthcare proxy.  She and her daughter Cesar Maldonado, are making decisions together as a family  Attempted to elicit goals and values relevant to patient.  Per family, patient was independent and "perfectly fine" prior to events leading up to this hospitalization.  Wife does share that she had noticed increased weakness, that he was sleeping more but they like in this to developing pneumonia.  I did share with family results of bedside swallow evaluation this morning.  Patient was able to participate in assessment to a very limited degree.  He did not initiate swallowing, and bolus had to be suctioned out of oral cavity.  I prepared family for questions regarding artificial feeding.  I also introduced the topics of CODE STATUS in order to confirm CODE STATUS but they shared with me that they would not be talking about that anymore.  I see that he is now  a DNR in the computer with an order placed on 02/28/2017.  I was unable to confirm that this is correct    SUMMARY OF RECOMMENDATIONS   Family agreeable to temporary feeding measures such as core track.  At this point they were I am prepared to make a decision regarding a PEG tube I did not get the feeling that they were open to meeting with palliative medicine again however it could be this provider and PMT will continue to monitor patient's clinical condition for place to reengage Code Status/Advance Care Planning:  DNR   Palliative Prophylaxis:   Aspiration, Bowel Regimen, Delirium Protocol, Eye Care, Frequent Pain Assessment, Oral Care and Turn Reposition  Additional Recommendations (Limitations, Scope, Preferences):  Full Scope Treatment except for DNR/DNI  Psycho-social/Spiritual:   Desire for further Chaplaincy support:no   Prognosis:   Unable to determine  Discharge Planning: To Be Determined      Primary Diagnoses: Present on Admission: . Basal ganglia hemorrhage ([place]) . ICH (intracerebral hemorrhage) ([place])   I have reviewed the medical record, interviewed the patient and family, and examined the patient. The following aspects are pertinent.  Past Medical History:  Diagnosis Date  . BPH (benign prostatic hyperplasia)   .  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 Dementia   . Hypertension   . Left ventricular diastolic dysfunction, NYHA class 1   . Mental disorder    alzhiemers  . Syncope    Cardionet 04/14/12   Social History   Socioeconomic History  . Marital status: Married    Spouse name: Cesar Maldonado  . Number of children: 4  . Years of education: 86  . Highest education level: None  Social Needs  . Financial resource strain: None  . Food insecurity - worry: None  . Food insecurity - inability: None  . Transportation needs - medical: None  . Transportation needs - non-medical:  None  Occupational History  . Occupation: Retired, plaster work  Tobacco Use  . Smoking status: Former Smoker    Last attempt to quit: 01/15/1988    Years since quitting: 29.1  . Smokeless tobacco: Never Used  Substance and Sexual Activity  . Alcohol use: No    Alcohol/week: 1.2 oz    Types: 2 Shots of liquor per week  . Drug use: No  . Sexual activity: None  Other Topics Concern  . None  Social History Narrative   Married.  Lives with wife.  Ambulates independently.   Caffeine use: Drinks coffee 1 cup per day at most   Drinks 1/2 can soda per day    Family History  Problem Relation Age of Onset  . Cancer Sister    Scheduled Meds: . chlorhexidine  15 mL Mouth Rinse BID  . diltiazem  240 mg Oral Daily  . dorzolamide-timolol  1 drop Both Eyes BID  . latanoprost  1 drop Both Eyes QHS  . mouth rinse  15 mL Mouth Rinse q12n4p  . pantoprazole (PROTONIX) IV  40 mg Intravenous QHS  . senna-docusate  1 tablet Oral BID   Continuous Infusions: . sodium chloride 50 mL/hr at 03/01/17 0600   PRN Meds:.acetaminophen **OR** acetaminophen (TYLENOL) oral liquid 160 mg/5 mL **OR** acetaminophen, labetalol Medications Prior to Admission:  Prior to Admission medications   Medication Sig Start Date End Date Taking? Authorizing Provider  acidophilus (RISAQUAD) CAPS capsule Take 1 capsule by mouth every other day.   Yes [provider]  cholecalciferol (VITAMIN D) 1000 units tablet Take 1,000 Units by mouth every other day.   Yes [provider]  diltiazem (CARTIA XT) 120 MG 24 hr capsule Take 120 mg by mouth daily.   Yes [provider]  dorzolamide-timolol (COSOPT) 22.3-6.8 MG/ML ophthalmic solution Place 1 drop into both eyes 2 (two) times daily.    Yes [provider]  lactose free nutrition (BOOST) LIQD Take 237 mLs by mouth every other day.    Yes [provider]  latanoprost (XALATAN) 0.005 % ophthalmic solution Place 1 drop into both eyes at  bedtime.   Yes [provider]  Multiple Vitamins-Minerals (CENTRUM PO) Take 1 tablet by mouth every other day.    Yes [provider]   No Known Allergies Review of Systems  Unable to perform ROS: Acuity of condition    Physical Exam  Constitutional:  Frail elderly man in no acute distress  HENT:  Head: Normocephalic.  Temporal wasting  Pulmonary/Chest: Effort normal.  Nursing note and vitals reviewed.   Vital Signs: BP 138/80   Pulse 84   Temp 99.3 F (37.4 C) (Oral)   Resp (!) 28   Ht '5\' 8"'$  (1.727 m)  Wt 47 kg (103 lb 9.9 oz)   SpO2 100%   BMI 15.75 kg/m  Pain Assessment: No/denies pain   Pain Score: Asleep   SpO2: SpO2: 100 % O2 Device:SpO2: 100 % O2 Flow Rate: .O2 Flow Rate (L/min): 2 L/min  IO: Intake/output summary:   Intake/Output Summary (Last 24 hours) at 03/01/2017 1634 Last data filed at 03/01/2017 1600 Gross per 24 hour  Intake 1562.5 ml  Output 775 ml  Net 787.5 ml    LBM: Last BM Date: (PTA) Baseline Weight: Weight: 47 kg (103 lb 9.9 oz) Most recent weight: Weight: 47 kg (103 lb 9.9 oz)     Palliative Assessment/Data:   Flowsheet Rows     Most Recent Value  Intake Tab  Referral Department  Neurology  Unit at Time of Referral  ICU  Palliative Care Primary Diagnosis  Neurology  Date Notified  02/27/17  Palliative Care Type  New Palliative care  Reason for referral  Clarify Goals of Care  Date of Admission  02/27/17  Date first seen by Palliative Care  03/01/17  # of days Palliative referral response time  2 Day(s)  # of days IP prior to Palliative referral  0  Clinical Assessment  Palliative Performance Scale Score  30%  Pain Max last 24 hours  Not able to report  Pain Min Last 24 hours  Not able to report  Dyspnea Max Last 24 Hours  Not able to report  Dyspnea Min Last 24 hours  Not able to report  Nausea Max Last 24 Hours  Not able to report  Nausea Min Last 24 Hours  Not able to report  Anxiety Max Last 24  Hours  Not able to report  Anxiety Min Last 24 Hours  Not able to report  Other Max Last 24 Hours  Not able to report  Psychosocial & Spiritual Assessment  Palliative Care Outcomes  Patient/Family meeting held?  Yes  Who was at the meeting?  wife, dtr  Patient/Family wishes: Interventions discontinued/not started   Mechanical Ventilation      Time In: 1530 Time Out: 1615 Time Total: 45 min Greater than 50%  of this time was spent counseling and coordinating care related to the above assessment and plan.  Signed by: Dory Horn, NP   Please contact Palliative Medicine Team phone at [phone] for questions and concerns.  For individual provider: See Shea Evans

## 2017-03-02 ENCOUNTER — Inpatient Hospital Stay (HOSPITAL_COMMUNITY): Payer: Medicare Other

## 2017-03-02 LAB — BASIC METABOLIC PANEL
Anion gap: 13 (ref 5–15)
BUN: 26 mg/dL — ABNORMAL HIGH (ref 6–20)
CHLORIDE: 107 mmol/L (ref 101–111)
CO2: 20 mmol/L — AB (ref 22–32)
Calcium: 8.9 mg/dL (ref 8.9–10.3)
Creatinine, Ser: 1.12 mg/dL (ref 0.61–1.24)
GFR calc non Af Amer: 55 mL/min — ABNORMAL LOW (ref >60)
Glucose, Bld: 91 mg/dL (ref 65–99)
POTASSIUM: 3.7 mmol/L (ref 3.5–5.1)
Sodium: 140 mmol/L (ref 135–145)

## 2017-03-02 LAB — CBC
HEMATOCRIT: 28.3 % — AB (ref 39.0–52.0)
HEMOGLOBIN: 9.2 g/dL — AB (ref 13.0–17.0)
MCH: 26.2 pg (ref 26.0–34.0)
MCHC: 32.5 g/dL (ref 30.0–36.0)
MCV: 80.6 fL (ref 78.0–100.0)
Platelets: 98 10*3/uL — ABNORMAL LOW (ref 150–400)
RBC: 3.51 MIL/uL — AB (ref 4.22–5.81)
RDW: 14 % (ref 11.5–15.5)
WBC: 10.6 10*3/uL — AB (ref 4.0–10.5)

## 2017-03-02 MED ORDER — PIPERACILLIN-TAZOBACTAM 3.375 G IVPB
3.3750 g | Freq: Three times a day (TID) | INTRAVENOUS | Status: DC
  Administered 2017-03-02 – 2017-03-04 (×7): 3.375 g via INTRAVENOUS
  Filled 2017-03-02 (×8): qty 50

## 2017-03-02 NOTE — Evaluation (Signed)
Speech Language Pathology Evaluation Patient Details Name: Cesar Maldonado MRN: [id number] DOB: 1924/08/19 Today's Date: 03/02/2017 Time: 0900-0909 SLP Time Calculation (min) (ACUTE ONLY): 9 min  Problem List:  Patient Active Problem List   Diagnosis Date Noted  . ICH (intracerebral hemorrhage) (HCC) 02/28/2017  . Palliative care encounter   . 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 Protein-calorie malnutrition, severe (HCC) 10/23/2013  . Acute respiratory failure with hypoxia (HCC) 10/21/2013  . Bradycardia 10/21/2013  . CKD (chronic kidney disease), stage III (HCC) 10/21/2013  . Abnormal head CT 10/21/2013  . Syncope 10/20/2013  . CAP (community acquired pneumonia) 10/20/2013  . Dehydration 02/25/2012  . Syncope and collapse 02/24/2012  . Hypertension   . Dementia   . BPH (benign prostatic hyperplasia)   . Left ventricular diastolic dysfunction, NYHA class 1   . Cerebrovascular disease    Past Medical History:  Past Medical History:  Diagnosis Date  . BPH (benign prostatic hyperplasia)   . 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 Dementia   . Hypertension   . Left ventricular diastolic dysfunction, NYHA class 1   . Mental disorder    alzhiemers  . Syncope    Cardionet 04/14/12   Past Surgical History:  Past Surgical History:  Procedure Laterality Date  . BREAST SURGERY     left tumor  . PARS PLANA VITRECTOMY  12/19/2010   Procedure: PARS PLANA VITRECTOMY WITH INTRAOCULAR LENS;  Surgeon: Shade Flood, MD;  Location: [facility] OR;  Service: Ophthalmology;  Laterality: Right;  PARS PLANA VITRECTOMY WITH INTRAOCULAR LENS [900]membrane peel,endolaser right eye   HPI:  Pt is a 84 y.o.malepast medical history of dementia, hypertension and chronic white matter disease transferred to [facility] after presenting to [facility] emergency room.He was brought by family for worsening confusion and  reduced food intake since last 1-2 days. Head CT was performed which showed acuterightbasal ganglia/thalamus hematoma with intraventricular extension. Chest x ray with left lower lobe pneumonia and bilateral pleural effusions.   Assessment / Plan / Recommendation Clinical Impression   Patient presents with baseline cognitive deficits due to dementia. Wife, daughter report prior to admission pt was typically oriented to self, location and date, though sometimes "there were questions he couldn't answer." Short-term memory impairments and decreased initiation/verbal expression are also baseline. Pt is oriented to self only, and follows 40% of simple single commands without cues. Long-term memory deficits are also apparent, however I suspect much of this is baseline. Decreased sustained attention noted during functional activity, with pt easily distracted during self-feeding. He requires moderate verbal, tactile and visual cues to refocus attention on feeding. SLP will follow to maximize cognition in the context of functional activities.     SLP Assessment  SLP Recommendation/Assessment: Patient needs continued Speech Lanaguage Pathology Services SLP Visit Diagnosis: Cognitive communication deficit (R41.841)    Follow Up Recommendations  Other (comment)(tbd)    Frequency and Duration min 1 x/week  1 week      SLP Evaluation Cognition  Overall Cognitive Status: Impaired/Different from baseline Arousal/Alertness: Awake/alert Orientation Level: Oriented to person;Disoriented to time;Disoriented to place;Disoriented to situation Attention: Focused;Sustained Focused Attention: Appears intact Sustained Attention: Impaired Sustained Attention Impairment: Functional basic Memory: Impaired Memory Impairment: Storage deficit;Decreased recall of new information;Decreased short term memory;Decreased long term memory Decreased Long Term Memory: Functional basic(Unable to tell SLP how many children he  has) Decreased Short Term Memory: Functional basic  Comprehension  Auditory Comprehension Overall Auditory Comprehension: Impaired Yes/No Questions: Within Functional Limits(simple Y/N) Commands: Impaired One Step Basic Commands: 25-49% accurate Conversation: Simple Interfering Components: Attention Visual Recognition/Discrimination Discrimination: Not tested Reading Comprehension Reading Status: Not tested    Expression Expression Primary Mode of Expression: Verbal Verbal Expression Overall Verbal Expression: Impaired at baseline Initiation: Impaired Automatic Speech: Name Level of Generative/Spontaneous Verbalization: Word;Phrase Written Expression Dominant Hand: Right Written Expression: Not tested   Oral / Motor  Oral Motor/Sensory Function Overall Oral Motor/Sensory Function: Generalized oral weakness(limited assessment; pt inconsistently follows commands) Motor Speech Overall Motor Speech: Appears within functional limits for tasks assessed Respiration: Within functional limits Phonation: Low vocal intensity Articulation: Within functional limitis Intelligibility: Intelligible Motor Planning: Witnin functional limits Motor Speech Errors: Not applicable Interfering Components: Premorbid status Effective Techniques: Increased vocal intensity   GO                   Rondel BatonMary Beth Asharia Lotter, MS, CCC-SLP Speech-Language Pathologist [phone]  Arlana LindauMary E Meda Dudzinski 03/02/2017, 10:29 AM

## 2017-03-02 NOTE — Progress Notes (Signed)
  Speech Language Pathology Treatment: Dysphagia  Patient Details Name: Cesar Maldonado MRN: [id number] DOB: 07-Sep-1924 Today's Date: 03/02/2017 Time: [id number] SLP Time Calculation (min) (ACUTE ONLY): 38 min  Assessment / Plan / Recommendation Clinical Impression  Patient seen for reassessment for PO readiness. Wife and daughter Cesar Maldonado present and requesting information re: next steps if pt is unable to take food by mouth. SLP educated re: impacts of dementia on swallow function in the context of acute illness, decompensation. Pt's wife reports pt did cough when eating or drinking prior to this admission. SLP provided oral care and educated family re: importance of oral hygiene to decrease bacterial load to reduce aspiration risk. Pt is more alert and interactive today. He initially declines PO, however with encouragement and SLP assist, he self-feeds thin liquids via cup and straw sips, as well as pureed solids. Prolonged oral holding noted, with intermittent anterior oral spillage. Pt does initiate what appears to be a significantly delayed swallow, with no overt signs of aspiration with ice chips or initial sip of thin liquids. As trials progressed, pt presented with both delayed and immediate wet coughing with thin liquids and puree, concerning for decreased airway protection. While pt may have had an acute decline in swallow function, I suspect this is superimposed upon underlying chronic dysphagia. Extensive education provided to family re: treatment options as well as fair prognosis for improvement in light of probable chronic issues. I provided education re: temporary alternative means of nutrition, ongoing aspiration risk even with alternative means, as well as quality of life considerations in context of pt's dementia. Family express desire for temporary means of nutrition to determine if pt will make some improvements in swallow function. Recommend NPO with ice chips PRN after thorough oral care;  MD paged. Will continue to follow.        HPI HPI: Pt is a 84 y.o.malepast medical history of dementia, hypertension and chronic white matter disease [facility]He was brought by family for worsening confusion and reduced food intake since last 1-2 days. Head CT was performed which showed acuterightbasal ganglia/thalamus hematoma with intraventricular extension. Chest x ray with left lower lobe pneumonia and bilateral pleural effusions.      SLP Plan  Continue with current plan of care       Recommendations  Diet recommendations: NPO;Other(comment)(ice chips if alert after oral care) Medication Administration: Via alternative means Supervision: Full supervision/cueing for compensatory strategies Compensations: Minimize environmental distractions Postural Changes and/or Swallow Maneuvers: Seated upright 90 degrees                Oral Care Recommendations: Oral care QID;Oral care prior to ice chip/H20 Follow up Recommendations: Other (comment)(tbd) SLP Visit Diagnosis: Dysphagia, unspecified (R13.10) Plan: Continue with current plan of care       GO              Cesar BatonMary Beth Elyssia Strausser, MS, CCC-SLP Speech-Language Pathologist [phone]  Cesar Maldonado 03/02/2017, 9:54 AM

## 2017-03-02 NOTE — Progress Notes (Signed)
STROKE TEAM PROGRESS NOTE   SUBJECTIVE (INTERVAL HISTORY) Wife and daughter at bedside. Patient failed swallow. Start feeding tomorrow and placing ngtube. Being followed by CIR rehab. Elevated WBcs and febrile with lower lobe infiltrate, started Zosyn. MRI brain evolving right BG/thal hematomia.   OBJECTIVE Temp:  [98.3 F (36.8 C)-100.5 F (38.1 C)] 100.5 F (38.1 C) (02/17 1020) Pulse Rate:  [83-93] 90 (02/17 1020) Cardiac Rhythm: Normal sinus rhythm (02/17 0700) Resp:  [15-28] 15 (02/17 1020) BP: (125-145)/(65-84) 145/76 (02/17 1020) SpO2:  [96 %-100 %] 96 % (02/17 1020)  No results for input(s): GLUCAP in the last 168 hours. Recent Labs  Lab 02/23/17 1237 02/27/17 1850 02/28/17 0916 03/01/17 0246 03/02/17 0943  NA 130* 130* 133* 134* 140  K 4.2 3.8 3.4* 5.7* 3.7  CL 95* 94* 99* 101 107  CO2 26 21* 21* 19* 20*  GLUCOSE 95 103* 92 84 91  BUN 15 20 19  26* 26*  CREATININE 1.04 1.30* 1.19 1.20 1.12  CALCIUM 9.2 8.9 8.4* 8.2* 8.9   Recent Labs  Lab 02/23/17 1237 02/27/17 1850  AST 45* 71*  ALT 12* 18  ALKPHOS 91 97  BILITOT 0.4 0.7  PROT 7.4 7.4  ALBUMIN 3.3* 3.2*   Recent Labs  Lab 02/23/17 1237 02/27/17 1850 02/28/17 0916 03/01/17 0246 03/02/17 0943  WBC 5.4 6.8 7.4 6.8 10.6*  NEUTROABS 3.7 6.0  --   --   --   HGB 9.6* 10.0* 9.7* 8.8* 9.2*  HCT 29.4* 30.1* 29.9* 25.9* 28.3*  MCV 81.4 80.7 80.2 79.9 80.6  PLT 88* 65* 75* 92* 98*   No results for input(s): CKTOTAL, CKMB, CKMBINDEX, TROPONINI in the last 168 hours. No results for input(s): LABPROT, INR in the last 72 hours. Recent Labs    02/28/17 0620  COLORURINE YELLOW  LABSPEC 1.029  PHURINE 5.0  GLUCOSEU NEGATIVE  HGBUR NEGATIVE  BILIRUBINUR NEGATIVE  KETONESUR 5*  PROTEINUR 30*  NITRITE NEGATIVE  LEUKOCYTESUR NEGATIVE       Component Value Date/Time   CHOL 176 02/28/2017 0916   CHOL 189 04/11/2014 1007   TRIG 115 02/28/2017 0916   HDL 33 (L) 02/28/2017 0916   HDL 76 04/11/2014 1007    CHOLHDL 5.3 02/28/2017 0916   VLDL 23 02/28/2017 0916   LDLCALC 120 (H) 02/28/2017 0916   LDLCALC 101 (H) 04/11/2014 1007   Lab Results  Component Value Date   HGBA1C 6.4 (H) 02/28/2017      Component Value Date/Time   LABOPIA NONE DETECTED 07/23/2015 2101   COCAINSCRNUR NONE DETECTED 07/23/2015 2101   LABBENZ NONE DETECTED 07/23/2015 2101   AMPHETMU NONE DETECTED 07/23/2015 2101   THCU NONE DETECTED 07/23/2015 2101   LABBARB NONE DETECTED 07/23/2015 2101    No results for input(s): ETH in the last 168 hours.   IMAGING  I have personally reviewed the radiological images below and agree with the radiology interpretations.  Ct Angio Head W Or Wo Contrast 02/28/2017 IMPRESSION:   CT HEAD:  1. Stable evolving acute RIGHT basal ganglia/thalamus hematoma with intraventricular extension.   No hydrocephalus.  2. Stable regional mass effect with 5 mm RIGHT to LEFT midline shift.  3. Stable severe atrophy and chronic small vessel ischemic disease.    CTA HEAD:  1. No emergent large vessel occlusion, no aneurysm.  2. Intracranial atherosclerosis resulting in multifocal severe stenosis anterior and posterior circulation.     Dg Chest 2 View 03/02/2017 IMPRESSION: Relatively unchanged left lower lobe infiltrate.  Dg Chest 2 View 02/27/2017 IMPRESSION:  Left lower lobe pneumonia and bilateral pleural effusions.    Dg Chest 2 View 02/23/2017 Small bilateral pleural effusions.   COPD.     Dg Abd 1 View 02/17/2017 IMPRESSION:  Negative.    Ct Head Wo Contrast 02/27/2017 IMPRESSION:  1. ~20 cc hematoma centered in the right basal ganglia and internal capsule. There is intraventricular hemorrhage with blood layering in the occipital horns of lateral ventricles.  2. Severe brain atrophy with chronic small vessel ischemia.     Ct Head Wo Contrast 02/23/2017 IMPRESSION:  1. No evidence of acute intracranial abnormality  2. Atrophy, chronic small-vessel white matter  ischemic changes and remote left basal ganglia infarct.     Mr Brain Wo Contrast 03/01/2017 IMPRESSION:  1. Moderately motion degraded examination.  2. Evolving acute RIGHT basal ganglia/thalamus hematoma with similar mass effect, 4 mm RIGHT to LEFT midline shift without ventricular entrapment.  3. Small volume redistributed subarachnoid hemorrhage. Similar layering blood products occipital horns. No hydrocephalus.  4. Severe parenchymal brain volume loss and chronic small vessel ischemic disease. Old LEFT basal ganglia lacunar infarct.     Vas US Carotid 02/28/2017 Right Carotid: Velocities in the right ICA are consistent with a 1-39% stenosis.  Left Carotid: Velocities in the left ICA are consistent with a 1-39% stenosis.  Vertebrals: Both vertebral arteries were patent with antegrade flow.  Subclavians: Normal flow hemodynamics were seen in bilateral subclavian arteries.     Transthoracic Echocardiogram  02/28/2017 Study Conclusions - Left ventricle: Systolic function was normal. The estimated   ejection fraction was in the range of 55% to 60%. - Mitral valve: Calcified annulus. Moderately thickened, mildly   calcified leaflets . - Atrial septum: No defect or patent foramen ovale was identified. - Tricuspid valve: There was moderate regurgitation. - Pulmonary arteries: PA peak pressure: 50 mm Hg (S).  Repeat MRI brain:   1. Moderately motion degraded examination. 2. Evolving acute RIGHT basal ganglia/thalamus hematoma with similar mass effect, 4 mm RIGHT to LEFT midline shift without ventricular entrapment. 3. Small volume redistributed subarachnoid hemorrhage. Similar layering blood products occipital horns. No hydrocephalus. 4. Severe parenchymal brain volume loss and chronic small vessel ischemic disease. Old LEFT basal ganglia lacunar infarct.  PHYSICAL EXAM  Temp:  [98.3 F (36.8 C)-100.5 F (38.1 C)] 100.5 F (38.1 C) (02/17 1020) Pulse Rate:  [83-93] 90  (02/17 1020) Resp:  [15-28] 15 (02/17 1020) BP: (125-145)/(65-84) 145/76 (02/17 1020) SpO2:  [96 %-100 %] 96 % (02/17 1020)  General - Well nourished, well developed, in no apparent distress.  Ophthalmologic - fundi not visualized due to noncooperation.  Cardiovascular - Regular rate and rhythm.  Neuro - sleepy but easily to arouse. Orientated to self, age and people, but not to time or place.  Following simple commands, paucity of speech, dysarthria.  Naming 1/2, able to repeat simple sentences.  PERRL, EOMI, blinking to visual threat bilaterally, left facial droop, moderate dysarthria, tongue midline.  Moving all extremities symmetrically and equally, at least 4/5.  DTR 1+, no Babinski.  Sensation symmetrical, coordination slow but intact.  Gait deferred.   ASSESSMENT/PLAN Mr. Cesar Maldonado is a 84 y.o. male with history of dementia, HTN, remote lacunar infarcts on MRI, and BPH admitted for worsening confusion, decreased p.o. intake for 1-2 days. No tPA given due to ICH.    ICH:  right BG/thalamus ICH with IVH likely secondary to small vessel disease source  Resultant left facial droop  MRI - Rt BG hematoma - 4 mm Rt to Lt shift - small SAH - old LEFT basal ganglia lacunar infarct. Repeat evolving acute RIGHT basal ganglia/thalamus hematoma with similar mass effect  CTA head -no aneurysm or AVM, diffuse atherosclerosis including left M2, bilateral ACAs and bilateral distal PCAs  Carotid Doppler - unremarkable  2D Echo - EF 55-60%. No cardiac source of emboli identified.  LDL 120  HgbA1c 6.4  SCDs for VTE prophylaxis  Failed swallow, placing ngtube with feeds in the morning Diet NPO time specified  Fall precautions   No antithrombotic prior to admission, now on No antithrombotic due to ICH  Ongoing aggressive stroke risk factor management  Therapy recommendations: CIR recommended. Rehabilitation M.D. consult on chart, following  Disposition: Pending  Dementia    History of mild dementia as per daughter  Able to perform most ADLs at home except finance  Deficit on short-term memory  No behavior disturbance  Hypertension Stable BP goal < 160 On home cardizem  Hyperlipidemia  Home meds:  none   LDL 120, goal < 70  Consider low dose statin once he passes swallowing evaluation. (still NPO)  Continue statin at discharge  Dysphagia  Moderate dysarthria  Failed bedside swallow screening  Speech on board  Consider resuming home meds and d/c IVF once he passes swallowing evaluation. (still NPO)  Other Stroke Risk Factors  Advanced age  Hx stroke/TIA on imaging  Other Active Problems  Mild hypokalemia - supplement -> hyperkalemia - 5.7 - recheck -> 3.7  Anemia of chronic disease - recheck in AM  Left lower lobe pneumonia by chest x-ray - WBCs 10.6 - Temp 100.5 - repeat CXR -> unchanged.  Blood cultures no growth 2 days  Palliative care consult on chart   PLAN  Follows with Dr. Lucia Gaskins at [facility]    Cortrak and feedings tomorrow (Cortraks not placed on Sundays) Ordered for Monday.  Start Zosyn for possible aspiration pneumonia. Increase IV NS to 75 cc / hr.  Hospital day # 3   Personally examined patient and images, and have participated in and made any corrections needed to history, physical, neuro exam,assessment and plan as stated above.  I have personally obtained the history, evaluated lab date, reviewed imaging studies and agree with radiology interpretations.    Naomie Dean, MD Stroke Neurology    To contact Stroke Continuity provider, please refer to [email or web address]. After hours, contact General Neurology

## 2017-03-03 LAB — CBC
HCT: 26.6 % — ABNORMAL LOW (ref 39.0–52.0)
Hemoglobin: 8.7 g/dL — ABNORMAL LOW (ref 13.0–17.0)
MCH: 26.4 pg (ref 26.0–34.0)
MCHC: 32.7 g/dL (ref 30.0–36.0)
MCV: 80.9 fL (ref 78.0–100.0)
PLATELETS: 87 10*3/uL — AB (ref 150–400)
RBC: 3.29 MIL/uL — AB (ref 4.22–5.81)
RDW: 14.1 % (ref 11.5–15.5)
WBC: 11.1 10*3/uL — ABNORMAL HIGH (ref 4.0–10.5)

## 2017-03-03 LAB — GLUCOSE, CAPILLARY: GLUCOSE-CAPILLARY: 94 mg/dL (ref 65–99)

## 2017-03-03 LAB — BASIC METABOLIC PANEL
Anion gap: 13 (ref 5–15)
BUN: 22 mg/dL — AB (ref 6–20)
CALCIUM: 8.8 mg/dL — AB (ref 8.9–10.3)
CO2: 20 mmol/L — ABNORMAL LOW (ref 22–32)
CREATININE: 1.15 mg/dL (ref 0.61–1.24)
Chloride: 110 mmol/L (ref 101–111)
GFR calc Af Amer: >60 mL/min (ref >60)
GFR, EST NON AFRICAN AMERICAN: 54 mL/min — AB (ref >60)
Glucose, Bld: 93 mg/dL (ref 65–99)
POTASSIUM: 3.6 mmol/L (ref 3.5–5.1)
SODIUM: 143 mmol/L (ref 135–145)

## 2017-03-03 MED ORDER — FREE WATER
30.0000 mL | Freq: Four times a day (QID) | Status: DC
Start: 2017-03-03 — End: 2017-03-04
  Administered 2017-03-03 – 2017-03-04 (×5): 30 mL

## 2017-03-03 MED ORDER — DILTIAZEM HCL ER COATED BEADS 180 MG PO CP24: 180.0000 mg | ORAL_CAPSULE | Freq: Every day | ORAL | Status: DC

## 2017-03-03 MED ORDER — JEVITY 1.2 CAL PO LIQD
1000.0000 mL | ORAL | Status: DC
  Administered 2017-03-03: 20 mL/h
  Filled 2017-03-03 (×5): qty 1000

## 2017-03-03 MED ORDER — ATORVASTATIN CALCIUM 10 MG PO TABS
20.0000 mg | ORAL_TABLET | Freq: Every day | ORAL | Status: DC
  Administered 2017-03-03: 20 mg via ORAL
  Filled 2017-03-03: qty 2

## 2017-03-03 NOTE — Progress Notes (Signed)
Nutrition Follow Up Note   DOCUMENTATION CODES:   Severe malnutrition in context of chronic illness  INTERVENTION:   Recommend monitor K, Mg, and P labs as pt at refeeding risk   Jevity 1.2 @ goal rate of 12m/hr- Initiate ate 272mhr and increase by 1518mr every 8 hrs until goal rate is reached  Free water flushes 21m57m6 hrs via tube  Regimen provides 1728kcal/day, 80g/day protein, 26g/day fiber, 1282ml31me water   Bowel regimen as needed per MD  NUTRITION DIAGNOSIS:   Severe Malnutrition related to chronic illness(AMS) as evidenced by severe muscle depletion, severe fat depletion.  GOAL:   Patient will meet greater than or equal to 90% of their needs  - not met  MONITOR:   PO intake, Labs, I & O's, Weight trends, TF tolerance  ASSESSMENT:   89 yo58ale admitted 2/14 with worsening AMS secondary to ICH, [place] poor PO intake. PMH severe PCM, CKD3, CVD.   Pt s/p Cortrak placement today. RD received consult to initiate tube feeds. Pt being followed by SLP unable to get approved for oral diet. Per chart, pt is weight stable. Tube feed recommendations above. No BM since admit; recommend bowel regimen as needed. Recommend monitor K, Mg, and P as pt at moderate refeeding risk. Palliative following.   Medications reviewed and include: protonix, senokot, zosyn, NaCl '@75ml'$ /hr  Labs reviewed: K 3.6 wnl, BUN 22(H), Ca 8.8(L) Wbc- 11.1(H), Ca 8.7(L), Hct 26.6(L)  Diet Order:  Diet NPO time specified Fall precautions  EDUCATION NEEDS:   Not appropriate for education at this time  Skin: Reviewed RN Assessment  Last BM:  PTA  Height:   Ht Readings from Last 1 Encounters:  02/27/17 '5\' 8"'$  (1.727 m)    Weight:   Wt Readings from Last 1 Encounters:  03/03/17 116 lb 10 oz (52.9 kg)    Ideal Body Weight:  69.9 kg  BMI:  Body mass index is 17.73 kg/m.  Estimated Nutritional Needs:   Kcal:  1600-1800kcal/day   Protein:  79-90g/day   Fluid:  >1.3L/day   CaseyKoleen DistanceRD, LDN Pager #- [phone] Hours Pager: [phone]

## 2017-03-03 NOTE — Care Management Important Message (Signed)
Important Message  Patient Details  Name: Cesar Maldonado MRN: [id number] Date of Birth: 1924/12/31   Medicare Important Message Given:  Yes    Cesar Maldonado 03/03/2017, 12:23 PM

## 2017-03-03 NOTE — Progress Notes (Signed)
I met with pt at bedside briefly and then contacted his wife and daughter by phone. We will meet at pt's bedside this morning together upon their arrival. I will follow up when they arrive. [phone]

## 2017-03-03 NOTE — Care Management Note (Signed)
Case Management Note  Patient Details  Name: Cesar Maldonado MRN: [id number] Date of Birth: July 13, 1924  Subjective/Objective:   Pt admitted with basal ganglia hemorrhage. He is from home with his spouse.                Action/Plan: Recommendations are for CIR. Pt received Cortrak tube today. CM following for d/c disposition.  Expected Discharge Date:  (unknown)               Expected Discharge Plan:  IP Rehab Facility  In-House Referral:  Clinical Social Work  Discharge planning Services     Post Acute Care Choice:    Choice offered to:     DME Arranged:    DME Agency:     HH Arranged:    HH Agency:     Status of Service:  In process, will continue to follow  If discussed at Long Length of Stay Meetings, dates discussed:    Additional Comments:  Kermit BaloKelli F Adeyemi Hamad, RN 03/03/2017, 12:28 PM

## 2017-03-03 NOTE — Progress Notes (Signed)
Cortrak Tube Team Note:  Consult received to place a Cortrak feeding tube.   A 10 F Cortrak tube was placed in the left nare and secured with a nasal bridle at 79 cm. Per the Cortrak monitor reading the tube tip is at the pyloris.   No x-ray is required. RN may begin using tube.   If the tube becomes dislodged please keep the tube and contact the Cortrak team at [email or web address] ([email or web address]) for replacement.  If after hours and replacement cannot be delayed, place a NG tube and confirm placement with an abdominal x-ray.    Betsey Holidayasey Levis Nazir MS, RD, LDN Pager #- [phone] After Hours Pager: [phone]

## 2017-03-03 NOTE — Progress Notes (Signed)
Physical Therapy Treatment Patient Details Name: Cesar Maldonado MRN: [id number] DOB: 04/21/1924 Today's Date: 03/03/2017    History of Present Illness 84 y.o. male PMHx: dementia, HTN and chronic white matter disease.  Admitted for worsening confusion and reduced food intake  CT showed acute right basal ganglia/thalamus hematoma with intraventricular extension    PT Comments    Pt tolerating treatment well and following commands well with increased time. Pt requires physical assist for bed mobility, transfers, and ambulation at this time. Focused on seated balance/trunk control this session with noted improvement in sitting balance and ability to reach outside BOS and return to midline at completion of activity. Family present and supportive throughout session. Pt to benefit from comprehensive inpatient therapies to maximize functional mobility and independence to regain PLOF as pt was previously living at home performing ADLs and ambulating without the need for an AD. Will continue to follow acutely and progress as tolerated.    Follow Up Recommendations  CIR;Supervision/Assistance - 24 hour     Equipment Recommendations  3in1 (PT);Wheelchair (measurements PT);Wheelchair cushion (measurements PT)    Recommendations for Other Services       Precautions / Restrictions Precautions Precautions: Fall Restrictions Weight Bearing Restrictions: No    Mobility  Bed Mobility Overal bed mobility: Needs Assistance Bed Mobility: Supine to Sit     Supine to sit: Max assist;+2 for physical assistance;HOB elevated     General bed mobility comments: cues to initiate movement with UEs, assist to move legs off EOB, to elevate trunk, and to scoot pelvis to EOB  Transfers Overall transfer level: Needs assistance   Transfers: Sit to/from Stand;Stand Pivot Transfers Sit to Stand: Mod assist;+2 physical assistance Stand pivot transfers: Max assist;+2 physical assistance       General  transfer comment: mod assist +2 to stand from bed with assist for power up, cues for anterior translation, upright posture and hip extension. Max assist stand step transfer +2 with assist and multimodal cues for weight shift, balance, hand placement. slow intiation noted throughout  Ambulation/Gait Ambulation/Gait assistance: Max assist;+2 physical assistance Ambulation Distance (Feet): 1 Feet Assistive device: 2 person hand held assist Gait Pattern/deviations: Narrow base of support;Trunk flexed;Leaning posteriorly;Staggering right;Staggering left   Gait velocity interpretation: Below normal speed for age/gender General Gait Details: stand step to chair with decreased BOS requiring max assist +2 for weight shift and balance/trunk control. took ~4 small steps before sitting in Haematologistrecliner chair   Stairs            Wheelchair Mobility    Modified Rankin (Stroke Patients Only) Modified Rankin (Stroke Patients Only) Pre-Morbid Rankin Score: Slight disability Modified Rankin: Severe disability     Balance Overall balance assessment: Needs assistance Sitting-balance support: Bilateral upper extremity supported;Feet supported Sitting balance-Leahy Scale: Poor Sitting balance - Comments: left lateral lean with posterior buas Postural control: Posterior lean;Left lateral lean Standing balance support: Bilateral upper extremity supported Standing balance-Leahy Scale: Zero Standing balance comment: physical assist with bil UE support required for balance, noted left lateral lean with posterior bias                            Cognition Arousal/Alertness: Awake/alert Behavior During Therapy: Flat affect                       Current Attention Level: Sustained   Following Commands: Follows one step commands inconsistently;Follows one step commands with increased time  Safety/Judgement: Decreased awareness of safety;Decreased awareness of deficits     General  Comments: pt not talking throughout treatment session      Exercises  Reaching activity outside BOS for trunk control at EOB x5 min. Emphasis on improving trunk flexion and engaging abdominal muscles to decreased left lateral lean. Noted improvement with min guard assist at end of activity.     General Comments General comments (skin integrity, edema, etc.): family present and supportive throughout session      Pertinent Vitals/Pain Pain Assessment: Faces Faces Pain Scale: No hurt    Home Living                      Prior Function            PT Goals (current goals can now be found in the care plan section) Acute Rehab PT Goals Patient Stated Goal: return home PT Goal Formulation: Patient unable to participate in goal setting Time For Goal Achievement: 03/07/17 Potential to Achieve Goals: Fair Progress towards PT goals: Progressing toward goals    Frequency    Min 4X/week      PT Plan Current plan remains appropriate    Co-evaluation              AM-PAC PT "6 Clicks" Daily Activity  Outcome Measure  Difficulty turning over in bed (including adjusting bedclothes, sheets and blankets)?: Unable Difficulty moving from lying on back to sitting on the side of the bed? : Unable Difficulty sitting down on and standing up from a chair with arms (e.g., wheelchair, bedside commode, etc,.)?: Unable Help needed moving to and from a bed to chair (including a wheelchair)?: A Lot Help needed walking in hospital room?: A Lot Help needed climbing 3-5 steps with a railing? : Total 6 Click Score: 8    End of Session Equipment Utilized During Treatment: Gait belt Activity Tolerance: Patient tolerated treatment well Patient left: in chair;with call bell/phone within reach;with chair alarm set;with family/visitor present Nurse Communication: Mobility status PT Visit Diagnosis: Other abnormalities of gait and mobility (R26.89);Unsteadiness on feet (R26.81);Muscle  weakness (generalized) (M62.81)     Time: 0981-1914 PT Time Calculation (min) (ACUTE ONLY): 22 min  Charges:  $Therapeutic Activity: 8-22 mins                    G Codes:       Barrie Dunker, SPT   Barrie Dunker 03/03/2017, 1:29 PM

## 2017-03-03 NOTE — PMR Pre-admission (Signed)
PMR Admission Coordinator Pre-Admission Assessment  Patient: Cesar Maldonado is an 84 y.o., male MRN: [id number] DOB: [birth year] Height: 5\' 8"  (172.7 cm) Weight: 52.9 kg (116 lb 10 oz)              Insurance Information HMO: yes    PPO:      PCP:      IPA:      80/20:      OTHER: medicare advantage plan PRIMARY: [employer] Medicare      Policy#: [id number]      Subscriber: pt CM Name: Cesar Maldonado      Phone#: [phone]     Fax#: [phone] Pre-Cert#: [id number] approved for 7 days with f/u Kathlen Brunswick phone [phone] fax [phone]      Employer: retired Benefits:  Phone #: [phone]     Name: 03/03/17 Eff. Date: 01/14/2017     Deduct: none      Out of Pocket Max: [phone]      Life Max: none CIR: $430 co pay per day days 1 until 4      SNF: no co pay days 1 to 20: $160 co pay per day days 21 to 62; no co pay days 63 to 100 Outpatient: $40 co pay per visit     Co-Pay: visits per medical neccesity Home Health: 100%      Co-Pay: visits per medical neccesity DME: 80%     Co-Pay: 20% Providers: in network  SECONDARY: none       Medicaid Application Date:       Case Manager:  Disability Application Date:       Case Worker:   Emergency Conservator, museum/gallery Information    Name Relation Home Work [place] Spouse [phone]     Pecha,velma Daughter   [phone]     Current Medical History  Patient Admitting Diagnosis: right basal ganglia/thalamic hemorrhage  History of Present Illness:  HPI:  Cesar Lovering Nobleis a 84 y.o.RH male with history of dementia, HTN, BPH who was admitted on 02/27/17 with progressive issues with po intake, lethargy, abdominal pain and weakness. Was seen in ED 2/10 and treated for dehydration--CT head negative. Reports of fall 2 days PTA. Repeat CT the head done showed acute right basal ganglia/thalamus hematoma.   CTA of head was negative for AVM and showed evolving right basal ganglia/thalamus hematoma with IV extension,  stable right to left mass effect,stable severe atrophy and multifocal severe stenosis in anterior and posterior circulation due to atherosclerosis.  MRI brain showed evolving right basal ganglia/thalamus hematoma, stable mass effect and small volume SAH layering in occipital horns.  Carotid dopplers without significant ICA stenosis. 2D echo with EF 55-60% with moderately thickened MV and moderate TVR.   CXR showed LLL pneumonia and he was started on Cefepime for treatment. Swallow evaluation done and patient and limited due to lethargy as well as minimal efforts at swallow--kept NPO with cortrak for nutritional support and family would like to monitor progress prior to decision regarding PEG placement.  Patient with improvement in mentation, oriented to self only and able to follow simple with increased time.   Total: 7 NIHSS    Past Medical History  Past Medical History:  Diagnosis Date  . BPH (benign prostatic hyperplasia)   . 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 Dementia   . Hypertension   . Left ventricular diastolic dysfunction, NYHA  class 1   . Mental disorder    alzhiemers  . Syncope    Cardionet 04/14/12    Family History  family history includes Cancer in his sister.  Prior Rehab/Hospitalizations:  Has the patient had major surgery during 100 days prior to admission? No  Current Medications   Current Facility-Administered Medications:  .  0.9 %  sodium chloride infusion, , Intravenous, Continuous, Costello, Mary A, NP, Last Rate: 75 mL/hr at 03/04/17 0838 .  acetaminophen (TYLENOL) tablet 650 mg, 650 mg, Oral, Q4H PRN **OR** acetaminophen (TYLENOL) solution 650 mg, 650 mg, Per Tube, Q4H PRN **OR** acetaminophen (TYLENOL) suppository 650 mg, 650 mg, Rectal, Q4H PRN, Aroor, Georgiana Spinner R, MD .  atorvastatin (LIPITOR) tablet 20 mg, 20 mg, Oral, q1800, Costello, Mary A, NP, 20 mg at 03/03/17 1715 .   chlorhexidine (PERIDEX) 0.12 % solution 15 mL, 15 mL, Mouth Rinse, BID, Marvel Plan, MD, 15 mL at 03/04/17 1005 .  diltiazem (CARDIZEM CD) 24 hr capsule 180 mg, 180 mg, Oral, Daily, Costello, Mary A, NP .  dorzolamide-timolol (COSOPT) 22.3-6.8 MG/ML ophthalmic solution 1 drop, 1 drop, Both Eyes, BID, Aroor, Dara Lords, MD, 1 drop at 03/04/17 1005 .  feeding supplement (JEVITY 1.2 CAL) liquid 1,000 mL, 1,000 mL, Per Tube, Continuous, Micki Riley, MD, Last Rate: 50 mL/hr at 03/04/17 0649, 1,000 mL at 03/04/17 0649 .  free water 30 mL, 30 mL, Per Tube, Q6H, Micki Riley, MD, 30 mL at 03/04/17 1221 .  labetalol (NORMODYNE,TRANDATE) injection 10 mg, 10 mg, Intravenous, Q2H PRN, Aroor, Georgiana Spinner R, MD .  latanoprost (XALATAN) 0.005 % ophthalmic solution 1 drop, 1 drop, Both Eyes, QHS, Aroor, Dara Lords, MD, 1 drop at 03/03/17 2132 .  MEDLINE mouth rinse, 15 mL, Mouth Rinse, q12n4p, Marvel Plan, MD, 15 mL at 03/04/17 1221 .  pantoprazole (PROTONIX) injection 40 mg, 40 mg, Intravenous, QHS, Aroor, Dara Lords, MD, 40 mg at 03/03/17 2131 .  phosphorus (K PHOS NEUTRAL) tablet 500 mg, 500 mg, Oral, TID AC & HS, Micki Riley, MD, 500 mg at 03/04/17 1005 .  piperacillin-tazobactam (ZOSYN) IVPB 3.375 g, 3.375 g, Intravenous, Q8H, Rinehuls, David L, PA-C, Stopped at 03/04/17 1006 .  senna-docusate (Senokot-S) tablet 1 tablet, 1 tablet, Oral, BID, Aroor, Dara Lords, MD, 1 tablet at 03/04/17 1005  Facility-Administered Medications Ordered in Other Encounters:  .  0.3 mL EPINEPHrine 1 mg/mL(1:1000) in BSS (500 mL) irrigation, , , PRN, Shade Flood, MD .  balanced salts (BSS) ophthalmic solution, , , PRN, Shade Flood, MD, 500 mL at 12/19/10 1045 .  ceFAZolin (ANCEF) 200 mg/mL injection, , , PRN, Shade Flood, MD, 1 mL at 12/19/10 0940  Patients Current Diet: Diet NPO time specified Fall precautions.   Precautions / Restrictions Precautions Precautions: Fall Restrictions Weight Bearing Restrictions:  No   Has the patient had 2 or more falls or a fall with injury in the past year?No Fell 2 days pta, seen at Urgent care and discharged home Prior Activity Level Limited Community (1-2x/wk): Independnet without AD pta; has not driven in 10 years due to glaucoma  Home Assistive Devices / Equipment Home Assistive Devices/Equipment: None Home Equipment: Shower seat, Grab bars - tub/shower  Prior Device Use: Indicate devices/aids used by the patient prior to current illness, exacerbation or injury? None of the above  Prior Functional Level Prior Function Level of Independence: Independent Comments: lives with wife, daughter helps with bills and checks on parents  Self Care: Did the patient need  help bathing, dressing, using the toilet or eating?  Independent  Indoor Mobility: Did the patient need assistance with walking from room to room (with or without device)? Independent  Stairs: Did the patient need assistance with internal or external stairs (with or without device)? Independent  Functional Cognition: Did the patient need help planning regular tasks such as shopping or remembering to take medications? Needed some help  Current Functional Level Cognition  Arousal/Alertness: Awake/alert Overall Cognitive Status: Impaired/Different from baseline Current Attention Level: Sustained Orientation Level: Oriented to person, Disoriented to place, Disoriented to time, Disoriented to situation Following Commands: Follows one step commands inconsistently, Follows one step commands with increased time Safety/Judgement: Decreased awareness of safety, Decreased awareness of deficits General Comments: pt not talking throughout treatment session Attention: Focused, Sustained Focused Attention: Appears intact Sustained Attention: Impaired Sustained Attention Impairment: Functional basic Memory: Impaired Memory Impairment: Storage deficit, Decreased recall of new information, Decreased short  term memory, Decreased long term memory Decreased Long Term Memory: Functional basic(Unable to tell SLP how many children he has) Decreased Short Term Memory: Functional basic    Extremity Assessment (includes Sensation/Coordination)  Upper Extremity Assessment: RUE deficits/detail, LUE deficits/detail RUE Deficits / Details: genrealized weakness LUE Coordination: decreased fine motor, decreased gross motor  Lower Extremity Assessment: Generalized weakness, LLE deficits/detail LLE Deficits / Details: grossly 3/5, functionally, difficulty controlling and advancing with gait    ADLs  Overall ADL's : Needs assistance/impaired Eating/Feeding: Total assistance Grooming: Moderate assistance, Wash/dry face Grooming Details (indicate cue type and reason): supported sitting Upper Body Bathing: Maximal assistance Upper Body Bathing Details (indicate cue type and reason): supported sitting Lower Body Bathing: Total assistance Lower Body Bathing Details (indicate cue type and reason): Mod A +2 sit<>stand Upper Body Dressing : Total assistance Upper Body Dressing Details (indicate cue type and reason): supported sitting Lower Body Dressing: Total assistance Lower Body Dressing Details (indicate cue type and reason): Mod A +2 sit<>stand Toilet Transfer: Maximal assistance, +2 for safety/equipment, Stand-pivot, BSC Toileting- Clothing Manipulation and Hygiene: Total assistance Toileting - Clothing Manipulation Details (indicate cue type and reason): Mod A +2 sit<>stand    Mobility  Overal bed mobility: Needs Assistance Bed Mobility: Supine to Sit Supine to sit: Max assist, +2 for physical assistance, HOB elevated General bed mobility comments: cues to initiate movement with UEs, assist to move legs off EOB, to elevate trunk, and to scoot pelvis to EOB    Transfers  Overall transfer level: Needs assistance Transfers: Sit to/from Stand, Stand Pivot Transfers Sit to Stand: Mod assist, +2 physical  assistance Stand pivot transfers: Max assist, +2 physical assistance General transfer comment: mod assist +2 to stand from bed with assist for power up, cues for anterior translation, upright posture and hip extension. Max assist stand step transfer +2 with assist and multimodal cues for weight shift, balance, hand placement. slow intiation noted throughout    Ambulation / Gait / Stairs / Psychologist, prison and probation services  Ambulation/Gait Ambulation/Gait assistance: Max assist, +2 physical assistance Ambulation Distance (Feet): 1 Feet Assistive device: 2 person hand held assist Gait Pattern/deviations: Narrow base of support, Trunk flexed, Leaning posteriorly, Staggering right, Staggering left General Gait Details: stand step to chair with decreased BOS requiring max assist +2 for weight shift and balance/trunk control. took ~4 small steps before sitting in recliner chair Gait velocity interpretation: Below normal speed for age/gender    Posture / Balance Dynamic Sitting Balance Sitting balance - Comments: left lateral lean with posterior buas Balance Overall balance assessment: Needs assistance  Sitting-balance support: Bilateral upper extremity supported, Feet supported Sitting balance-Leahy Scale: Poor Sitting balance - Comments: left lateral lean with posterior buas Postural control: Posterior lean, Left lateral lean Standing balance support: Bilateral upper extremity supported Standing balance-Leahy Scale: Zero Standing balance comment: physical assist with bil UE support required for balance, noted left lateral lean with posterior bias    Special needs/care consideration BiPAP/CPAP  N/a CPM  N/a Continuous Drip IV  IVF at 75 cc/hr Dialysis  N/a Life Vest  N/a Oxygen  N/a Special Bed  N/a Trach Size  N/a Wound Vac n/a Skin skin dry and flaky                            Bowel mgmt: no documented LBM since admit Bladder mgmt: incontinent; external catheter Diabetic mgmt n/a Palliative team  consulted for goals of care on 03/01/2017.Pt is DNR, but family wary of those discussions per Palliative team   Previous Home Environment Living Arrangements: Spouse/significant other  Lives With: Spouse Available Help at Discharge: Family, Available 24 hours/day(wife, local son and daughter can assist per daughter, Velma) Type of Home: House Home Layout: One level Home Access: Stairs to enter Entrance Stairs-Rails: Right Entrance Stairs-Number of Steps: 3 at front with rails both sides and 3 steps in back without rails Bathroom Shower/Tub: Engineer, manufacturing systemsTub/shower unit Bathroom Toilet: Standard Bathroom Accessibility: Yes How Accessible: Accessible via walker Home Care Services: No Additional Comments: home lay out clarified with family  Discharge Living Setting Plans for Discharge Living Setting: Lives with (comment), Patient's home Type of Home at Discharge: House Discharge Home Layout: One level Discharge Home Access: Stairs to enter Entrance Stairs-Rails: Right, Left Entrance Stairs-Number of Steps: 3 steps in front with rails both sides and 3 steps in back with no rails Discharge Bathroom Shower/Tub: Tub/shower unit Discharge Bathroom Toilet: Standard Discharge Bathroom Accessibility: Yes How Accessible: Accessible via walker Does the patient have any problems obtaining your medications?: No  Social/Family/Support Systems Patient Roles: Spouse, Parent(2 local children and 2 children out of town) SolicitorContact Information: Landscape architectVelma, daughter and pt's wife, [name] Anticipated Caregiver: wife and children Anticipated Industrial/product designerCaregiver's Contact Information: see above Caregiver Availability: 24/7 Discharge Plan Discussed with Primary Caregiver: Yes Is Caregiver In Agreement with Plan?: Yes Does Caregiver/Family have Issues with Lodging/Transportation while Pt is in Rehab?: No  Goals/Additional Needs Patient/Family Goal for Rehab: supervision to min assist with PT, OT, and SLP Expected length of stay:  ELOS 17 to 24 days Pt/Family Agrees to Admission and willing to participate: Yes Program Orientation Provided & Reviewed with Pt/Caregiver Including Roles  & Responsibilities: Yes  Decrease burden of Care through IP rehab admission: n/a  Possible need for SNF placement upon discharge: not anticipated  Patient Condition: This patient's medical and functional status has changed since the consult dated: 02/28/2017 in which the Rehabilitation Physician determined and documented that the patient's condition is appropriate for intensive rehabilitative care in an inpatient rehabilitation facility. See "History of Present Illness" (above) for medical update. Functional changes are: max assist. Patient's medical and functional status update has been discussed with the Rehabilitation physician and patient remains appropriate for inpatient rehabilitation. Will admit to inpatient rehab today.  Preadmission Screen Completed By:  Clois DupesBoyette, Dorwin Fitzhenry Godwin, 03/04/2017 12:44 PM ______________________________________________________________________   Discussed status with Dr. Riley KillSwartz on 03/04/2017 at 1244 and received telephone approval for admission today.  Admission Coordinator:  Clois DupesBoyette, Kessie Croston Godwin, time [id number] Date 03/04/2017

## 2017-03-03 NOTE — Progress Notes (Signed)
STROKE TEAM PROGRESS NOTE   SUBJECTIVE (INTERVAL HISTORY) Wife and daughter at bedside. Patient found in bed in NAD. Cortrak placed this AM and TF's in progress. WBC at 11.1 today, remains afebrile. IV Zosyn remains in progress for lower lobe infiltrate. CIR following for possible admission later this week. Long discussion with wife and daughter regarding POC. They would like to proceed with Cortrak/TF's and CIR admission for now. Informed they would need to make decision for PEG by end of  this week if that is how they would like to proceed with his care.  OBJECTIVE Temp:  [98 F (36.7 C)-99.8 F (37.7 C)] 98.3 F (36.8 C) (02/18 1220) Pulse Rate:  [80-99] 80 (02/18 1220) Cardiac Rhythm: Normal sinus rhythm (02/18 0800) Resp:  [16-20] 18 (02/18 1220) BP: (100-153)/(59-79) 100/59 (02/18 1220) SpO2:  [98 %-100 %] 100 % (02/18 1220) Weight:  [52.9 kg (116 lb 10 oz)] 52.9 kg (116 lb 10 oz) (02/18 1022)  No results for input(s): GLUCAP in the last 168 hours. Recent Labs  Lab 02/27/17 1850 02/28/17 0916 03/01/17 0246 03/02/17 0943 03/03/17 0229  NA 130* 133* 134* 140 143  K 3.8 3.4* 5.7* 3.7 3.6  CL 94* 99* 101 107 110  CO2 21* 21* 19* 20* 20*  GLUCOSE 103* 92 84 91 93  BUN 20 19 26* 26* 22*  CREATININE 1.30* 1.19 1.20 1.12 1.15  CALCIUM 8.9 8.4* 8.2* 8.9 8.8*   Recent Labs  Lab 02/27/17 1850  AST 71*  ALT 18  ALKPHOS 97  BILITOT 0.7  PROT 7.4  ALBUMIN 3.2*   Recent Labs  Lab 02/27/17 1850 02/28/17 0916 03/01/17 0246 03/02/17 0943 03/03/17 0229  WBC 6.8 7.4 6.8 10.6* 11.1*  NEUTROABS 6.0  --   --   --   --   HGB 10.0* 9.7* 8.8* 9.2* 8.7*  HCT 30.1* 29.9* 25.9* 28.3* 26.6*  MCV 80.7 80.2 79.9 80.6 80.9  PLT 65* 75* 92* 98* 87*   No results for input(s): CKTOTAL, CKMB, CKMBINDEX, TROPONINI in the last 168 hours. No results for input(s): LABPROT, INR in the last 72 hours. No results for input(s): COLORURINE, LABSPEC, PHURINE, GLUCOSEU, HGBUR, BILIRUBINUR,  KETONESUR, PROTEINUR, UROBILINOGEN, NITRITE, LEUKOCYTESUR in the last 72 hours.  Invalid input(s): APPERANCEUR     Component Value Date/Time   CHOL 176 02/28/2017 0916   CHOL 189 04/11/2014 1007   TRIG 115 02/28/2017 0916   HDL 33 (L) 02/28/2017 0916   HDL 76 04/11/2014 1007   CHOLHDL 5.3 02/28/2017 0916   VLDL 23 02/28/2017 0916   LDLCALC 120 (H) 02/28/2017 0916   LDLCALC 101 (H) 04/11/2014 1007   Lab Results  Component Value Date   HGBA1C 6.4 (H) 02/28/2017      Component Value Date/Time   LABOPIA NONE DETECTED 07/23/2015 2101   COCAINSCRNUR NONE DETECTED 07/23/2015 2101   LABBENZ NONE DETECTED 07/23/2015 2101   AMPHETMU NONE DETECTED 07/23/2015 2101   THCU NONE DETECTED 07/23/2015 2101   LABBARB NONE DETECTED 07/23/2015 2101    No results for input(s): ETH in the last 168 hours.   IMAGING  I have personally reviewed the radiological images below and agree with the radiology interpretations.  Ct Angio Head W Or Wo Contrast 02/28/2017 IMPRESSION:   CT HEAD:  1. Stable evolving acute RIGHT basal ganglia/thalamus hematoma with intraventricular extension.   No hydrocephalus.  2. Stable regional mass effect with 5 mm RIGHT to LEFT midline shift.  3. Stable severe atrophy and chronic small  vessel ischemic disease.    CTA HEAD:  1. No emergent large vessel occlusion, no aneurysm.  2. Intracranial atherosclerosis resulting in multifocal severe stenosis anterior and posterior circulation.   Dg Chest 2 View 03/02/2017 IMPRESSION: Relatively unchanged left lower lobe infiltrate.  Dg Chest 2 View 02/27/2017 IMPRESSION:  Left lower lobe pneumonia and bilateral pleural effusions.   Dg Chest 2 View 02/23/2017 Small bilateral pleural effusions.   COPD.   Dg Abd 1 View 02/17/2017 IMPRESSION:  Negative.   Ct Head Wo Contrast 02/27/2017 IMPRESSION:  1. ~20 cc hematoma centered in the right basal ganglia and internal capsule. There is intraventricular hemorrhage with  blood layering in the occipital horns of lateral ventricles.  2. Severe brain atrophy with chronic small vessel ischemia.    Ct Head Wo Contrast 02/23/2017 IMPRESSION:  1. No evidence of acute intracranial abnormality  2. Atrophy, chronic small-vessel white matter ischemic changes and remote left basal ganglia infarct.    Mr Brain Wo Contrast 03/01/2017 IMPRESSION:  1. Moderately motion degraded examination.  2. Evolving acute RIGHT basal ganglia/thalamus hematoma with similar mass effect, 4 mm RIGHT to LEFT midline shift without ventricular entrapment.  3. Small volume redistributed subarachnoid hemorrhage. Similar layering blood products occipital horns. No hydrocephalus.  4. Severe parenchymal brain volume loss and chronic small vessel ischemic disease. Old LEFT basal ganglia lacunar infarct.   Vas [place] Carotid 02/28/2017 Right Carotid: Velocities in the right ICA are consistent with a 1-39% stenosis.  Left Carotid: Velocities in the left ICA are consistent with a 1-39% stenosis.  Vertebrals: Both vertebral arteries were patent with antegrade flow.  Subclavians: Normal flow hemodynamics were seen in bilateral subclavian arteries.   Transthoracic Echocardiogram  02/28/2017 Study Conclusions - Left ventricle: Systolic function was normal. The estimated   ejection fraction was in the range of 55% to 60%. - Mitral valve: Calcified annulus. Moderately thickened, mildly   calcified leaflets . - Atrial septum: No defect or patent foramen ovale was identified. - Tricuspid valve: There was moderate regurgitation. - Pulmonary arteries: PA peak pressure: 50 mm Hg (S).  Repeat MRI brain:  03/01/2017  1. Moderately motion degraded examination. 2. Evolving acute RIGHT basal ganglia/thalamus hematoma with similar mass effect, 4 mm RIGHT to LEFT midline shift without ventricular entrapment. 3. Small volume redistributed subarachnoid hemorrhage. Similar layering blood products occipital horns.  No hydrocephalus. 4. Severe parenchymal brain volume loss and chronic small vessel ischemic disease. Old LEFT basal ganglia lacunar infarct.  PHYSICAL EXAM  Temp:  [98 F (36.7 C)-99.8 F (37.7 C)] 98.3 F (36.8 C) (02/18 1220) Pulse Rate:  [80-99] 80 (02/18 1220) Resp:  [16-20] 18 (02/18 1220) BP: (100-153)/(59-79) 100/59 (02/18 1220) SpO2:  [98 %-100 %] 100 % (02/18 1220) Weight:  [52.9 kg (116 lb 10 oz)] 52.9 kg (116 lb 10 oz) (02/18 1022)  General - Well nourished, well developed, in no apparent distress.  Ophthalmologic - fundi not visualized due to noncooperation.  Cardiovascular - Regular rate and rhythm.  Neuro - sleepy but easily to arouse. Oriented to self, age and people, but not to time or place.  Following simple commands, paucity of speech, dysarthria.  Naming 1/2, able to repeat simple sentences.  PERRL, EOMI, blinking to visual threat bilaterally, left facial droop, moderate dysarthria, tongue midline.  Moving all extremities. at least 4/5 but Left side appears weaker than Right.  DTR 1+, no Babinski.  Sensation symmetrical, coordination slow but intact.  Gait deferred.  ASSESSMENT/PLAN Mr. [name] is  a 84 y.o. male with history of dementia, HTN, remote lacunar infarcts on MRI, and BPH admitted for worsening confusion, decreased p.o. intake for 1-2 days. No tPA given due to ICH.    ICH:  right BG/thalamus ICH with IVH likely secondary to small vessel disease source  Resultant left facial droop  MRI - Rt BG hematoma - 4 mm Rt to Lt shift - small SAH - old LEFT basal ganglia lacunar infarct. Repeat evolving acute RIGHT basal ganglia/thalamus hematoma with similar mass effect  CTA head -no aneurysm or AVM, diffuse atherosclerosis including left M2, bilateral ACAs and bilateral distal PCAs  Carotid Doppler - unremarkable  2D Echo - EF 55-60%. No cardiac source of emboli identified.  LDL 120  HgbA1c 6.4  SCDs for VTE prophylaxis  Failed swallow, placing  ngtube with feeds in the morning Diet NPO time specified  Fall precautions   No antithrombotic prior to admission, now on No antithrombotic due to ICH  Ongoing aggressive stroke risk factor management  Therapy recommendations: CIR recommended.  Disposition: CIR decision -Pending  Dementia   History of mild dementia as per daughter  Able to perform most ADLs at home except finance  Deficit on short-term memory  No behavior disturbance  Hypertension Stable, one episode of hypotension overnight BP goal < 160 Cardizem home dose decreased PRN Labetalol  Hyperlipidemia  Home meds:  none   LDL 120, goal < 70  Lipitor 20 mg added  Continue statin at discharge  Dysphagia  Moderate dysarthria  Failed bedside swallow screening  SLP to re-evaluate swallow in AM  Home meds now per tube   Other Stroke Risk Factors  Advanced age  Hx stroke/TIA on imaging  Other Active Problems  Mild hypokalemia - supplement -> then hyperkalemia - 5.7 - recheck -> 3.7>3.6  Anemia of chronic disease - recheck in AM- Stable  Left lower lobe pneumonia by chest x-ray - WBCs 11.1 - Temp 98.1  Repeat CXR -> unchanged.  Blood cultures no growth 3 days  Palliative care consult on chart. Family not interested in P.Care at this time.  PLAN  Follows with Dr. Lucia Gaskins at [facility]    Cortrak placed 2/18 and TF's in progress  IV Zosyn in progress for possible aspiration pneumonia.   Hospital day # 4   Brita Romp Stroke Neurology Team 03/03/2017 1:14 PM I have personally examined this patient, reviewed notes, independently viewed imaging studies, participated in medical decision making and plan of care.ROS completed by me personally and pertinent positives fully documented  I have made any additions or clarifications directly to the above note. Agree with note above.  I had a long discussion with the patient, wife and daughter regarding his endocrine hemorrhage, dysphagia and  left hemiparesis. Family understands that it may take him a long time to recover and he may not recover completely and may need a lot of help and support which they may or may not be able to provide at home. They agree with DO NOT RESUSCITATE but would like to continue tube feeds for the next few days and see how he does. They're willing to discuss later this week decision on PEG tube if his swallowing does not improve. Greater than 50% time during this 35 minute visit was spent on counseling and coordination of care about his intracerebral hemorrhage, dysphagia, hemiplegia and discussing his prognosis and answered questions  Delia Heady, MD Medical Director [facility] Pager: [phone] 03/03/2017 3:13 PM  To contact Stroke Continuity provider, please refer to [email or web address] After hours, contact General Neurology

## 2017-03-03 NOTE — Progress Notes (Signed)
I met at pt's bedside with his wife and daughter, Suzi Roots. Pta pt was independent with all adls without AD. They wish to pursue an inpt rehab admit prior to return home with his wife with family assist. I will begin insurance authorization with [facility] for a possible admit tomorrow if medically ready per Dr. Leonie Man. [phone]

## 2017-03-03 NOTE — H&P (Signed)
Physical Medicine and Rehabilitation Admission H&P    Chief Complaint  Patient presents with  . Stroke with functional deficits.     HPI:  Cesar Maldonado is a 84 y.o. RH male with history of dementia, HTN, BPH who was admitted on 02/27/17 with progressive issues with po intake, lethargy, abdominal pain and weakness. Was seen in ED 2/10 and treated for dehydration--CT head negative. Reports of fall 2 days PTA. Repeat CT the head done showed acute right basal ganglia/ thalamus hematoma.   CTA of head was negative for AVM and showed evolving right basal ganglia/thalamus hematoma with IV extension, stable right to left mass effect,stable severe atrophy and multifocal severe stenosis in anterior and posterior circulation due to atherosclerosis.  MRI brain showed evolving right basal ganglia/thalamus hematoma, stable mass effect and small volume SAH layering in occipital horns.  Carotid dopplers without significant ICA stenosis. 2D echo with EF 55-60% with moderately thickened MV and moderate TVR.   CXR showed LLL pneumonia and was monitored till he developed fever 2/17. Repeat CXR without improvement therefore Zosyn added for treatment.  Swallow evaluation done and patient and limited due to lethargy as well as minimal efforts at swallow--kept NPO with cortak for nutritional support and family would like to monitor progress prior to decision regarding PEG placement.  Patient with improvement in mentation, oriented to self only and able to follow simple with increased time. CIR recommended due to functional deficits.    Review of Systems  Unable to perform ROS: Mental acuity  Musculoskeletal: Positive for myalgias and neck pain.      Past Medical History:  Diagnosis Date  . BPH (benign prostatic hyperplasia)   . 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 Dementia   . Hypertension   . Left ventricular diastolic  dysfunction, NYHA class 1   . Mental disorder    alzhiemers  . Syncope    Cardionet 04/14/12    Past Surgical History:  Procedure Laterality Date  . BREAST SURGERY     left tumor  . PARS PLANA VITRECTOMY  12/19/2010   Procedure: PARS PLANA VITRECTOMY WITH INTRAOCULAR LENS;  Surgeon: Adonis Brook, MD;  Location: [facility];  Service: Ophthalmology;  Laterality: Right;  PARS PLANA VITRECTOMY WITH INTRAOCULAR LENS [900]membrane peel,endolaser right eye    Family History  Problem Relation Age of Onset  . Cancer Sister     Social History:  Married. Lives with family and independent PTA. Per reports that he quit smoking about 29 years ago. he has never used smokeless tobacco. He reports that he does not drink alcohol or use drugs.    Allergies: No Known Allergies    Medications Prior to Admission  Medication Sig Dispense Refill  . acidophilus (RISAQUAD) CAPS capsule Take 1 capsule by mouth every other day.    . cholecalciferol (VITAMIN D) 1000 units tablet Take 1,000 Units by mouth every other day.    . diltiazem (CARTIA XT) 120 MG 24 hr capsule Take 120 mg by mouth daily.    . dorzolamide-timolol (COSOPT) 22.3-6.8 MG/ML ophthalmic solution Place 1 drop into both eyes 2 (two) times daily.     Marland Kitchen lactose free nutrition (BOOST) LIQD Take 237 mLs by mouth every other day.     . latanoprost (XALATAN) 0.005 % ophthalmic solution Place 1 drop into both eyes at bedtime.    . Multiple Vitamins-Minerals (CENTRUM PO) Take 1 tablet by  mouth every other day.       Drug Regimen Review  Drug regimen was reviewed and remains appropriate with no significant issues identified  Home: Home Living Family/patient expects to be discharged to:: Skilled nursing facility Living Arrangements: Spouse/significant other Available Help at Discharge: Family, Available 24 hours/day(wife, local son and daughter can assist per daughter, Velma) Type of Home: House Home Access: Stairs to enter Technical brewer of  Steps: 2 Entrance Stairs-Rails: Right Home Layout: One level Bathroom Shower/Tub: Chiropodist: Standard Bathroom Accessibility: Yes Home Equipment: Civil engineer, contracting, Grab bars - tub/shower Additional Comments: home lay out clarified with family  Lives With: Spouse   Functional History: Prior Function Level of Independence: Independent Comments: lives with wife, daughter helps with bills and checks on parents  Functional Status:  Mobility: Bed Mobility Overal bed mobility: Needs Assistance Bed Mobility: Supine to Sit Supine to sit: Max assist, +2 for physical assistance, HOB elevated General bed mobility comments: cues to initiate movement with UEs, assist to move legs off EOB, to elevate trunk, and to scoot pelvis to EOB Transfers Overall transfer level: Needs assistance Transfers: Sit to/from Stand, Stand Pivot Transfers Sit to Stand: Mod assist, +2 physical assistance Stand pivot transfers: Max assist, +2 physical assistance General transfer comment: mod assist +2 to stand from bed with assist for power up, cues for anterior translation, upright posture and hip extension. Max assist stand step transfer +2 with assist and multimodal cues for weight shift, balance, hand placement. slow intiation noted throughout Ambulation/Gait Ambulation/Gait assistance: Max assist, +2 physical assistance Ambulation Distance (Feet): 1 Feet Assistive device: 2 person hand held assist Gait Pattern/deviations: Narrow base of support, Trunk flexed, Leaning posteriorly, Staggering right, Staggering left General Gait Details: stand step to chair with decreased BOS requiring max assist +2 for weight shift and balance/trunk control. took ~4 small steps before sitting in recliner chair Gait velocity interpretation: Below normal speed for age/gender    ADL: ADL Overall ADL's : Needs assistance/impaired Eating/Feeding: Total assistance Grooming: Moderate assistance, Wash/dry  face Grooming Details (indicate cue type and reason): supported sitting Upper Body Bathing: Maximal assistance Upper Body Bathing Details (indicate cue type and reason): supported sitting Lower Body Bathing: Total assistance Lower Body Bathing Details (indicate cue type and reason): Mod A +2 sit<>stand Upper Body Dressing : Total assistance Upper Body Dressing Details (indicate cue type and reason): supported sitting Lower Body Dressing: Total assistance Lower Body Dressing Details (indicate cue type and reason): Mod A +2 sit<>stand Toilet Transfer: Maximal assistance, +2 for safety/equipment, Stand-pivot, BSC Toileting- Clothing Manipulation and Hygiene: Total assistance Toileting - Clothing Manipulation Details (indicate cue type and reason): Mod A +2 sit<>stand  Cognition: Cognition Overall Cognitive Status: Impaired/Different from baseline Arousal/Alertness: Awake/alert Orientation Level: Oriented to person, Disoriented to time, Disoriented to place, Disoriented to situation Attention: Focused, Sustained Focused Attention: Appears intact Sustained Attention: Impaired Sustained Attention Impairment: Functional basic Memory: Impaired Memory Impairment: Storage deficit, Decreased recall of new information, Decreased short term memory, Decreased long term memory Decreased Long Term Memory: Functional basic(Unable to tell SLP how many children he has) Decreased Short Term Memory: Functional basic Cognition Arousal/Alertness: Awake/alert Behavior During Therapy: Flat affect Overall Cognitive Status: Impaired/Different from baseline Area of Impairment: Orientation, Attention, Memory, Following commands, Safety/judgement Orientation Level: Disoriented to, Time, Situation, Place Current Attention Level: Sustained Memory: Decreased short-term memory, Decreased recall of precautions Following Commands: Follows one step commands inconsistently, Follows one step commands with increased  time Safety/Judgement: Decreased awareness of safety,  Decreased awareness of deficits General Comments: pt not talking throughout treatment session   Blood pressure 136/74, pulse 79, temperature 98.1 F (36.7 C), temperature source Oral, resp. rate 18, height '5\' 8"'$  (1.727 m), weight 52.9 kg (116 lb 10 oz), SpO2 100 %. Physical Exam  Nursing note and vitals reviewed. Constitutional: He appears well-developed. He appears lethargic. He appears cachectic. He is easily aroused. No distress.  HENT:  Head: Normocephalic and atraumatic.  Mouth/Throat: Oropharynx is clear and moist.  Eyes: Conjunctivae are normal. Right eye exhibits exudate. Left eye exhibits exudate.  Pin point pupils.  Neck:  Decreased ROM  Cardiovascular: Normal rate and regular rhythm.  Respiratory: Effort normal. No stridor. No respiratory distress. He has no wheezes.  Musculoskeletal: He exhibits no edema or tenderness.  RLE internally rotated and decreased ROM bilateral knees.   Neurological: He is easily aroused. He appears lethargic. He displays abnormal reflex.  Right inattention. Right facial weakness with moderate to sever dysarthria. Left ptosis. Keeps eyes closed.  Kept falling asleep.  Delayed processing --was able to state name and chose place with choice of two. Age " 84 years old". He was unable to follow simple motor commands for MMT.   Skin: He is not diaphoretic.  Psychiatric: His affect is inappropriate. His speech is slurred. He is slowed. Cognition and memory are impaired.    Results for orders placed or performed during the hospital encounter of 02/27/17 (from the past 48 hour(s))  CBC     Status: Abnormal   Collection Time: 03/02/17  9:43 AM  Result Value Ref Range   WBC 10.6 (H) 4.0 - 10.5 K/uL   RBC 3.51 (L) 4.22 - 5.81 MIL/uL   Hemoglobin 9.2 (L) 13.0 - 17.0 g/dL   HCT 28.3 (L) 39.0 - 52.0 %   MCV 80.6 78.0 - 100.0 fL   MCH 26.2 26.0 - 34.0 pg   MCHC 32.5 30.0 - 36.0 g/dL   RDW 14.0 11.5 -  15.5 %   Platelets 98 (L) 150 - 400 K/uL    Comment: CONSISTENT WITH PREVIOUS RESULT Performed at [facility] [address] [place], [place] [address]   Basic metabolic panel     Status: Abnormal   Collection Time: 03/02/17  9:43 AM  Result Value Ref Range   Sodium 140 135 - 145 mmol/L   Potassium 3.7 3.5 - 5.1 mmol/L    Comment: DELTA CHECK NOTED   Chloride 107 101 - 111 mmol/L   CO2 20 (L) 22 - 32 mmol/L   Glucose, Bld 91 65 - 99 mg/dL   BUN 26 (H) 6 - 20 mg/dL   Creatinine, Ser 1.12 0.61 - 1.24 mg/dL   Calcium 8.9 8.9 - 10.3 mg/dL   GFR calc non Af Amer 55 (L) >60 mL/min   GFR calc Af Amer >60 >60 mL/min    Comment: (NOTE) The eGFR has been calculated using the CKD EPI equation. This calculation has not been validated in all clinical situations. eGFR's persistently <60 mL/min signify possible Chronic Kidney Disease.    Anion gap 13 5 - 15    Comment: Performed at [facility] [address] [place] [address]  CBC     Status: Abnormal   Collection Time: 03/03/17  2:29 AM  Result Value Ref Range   WBC 11.1 (H) 4.0 - 10.5 K/uL   RBC 3.29 (L) 4.22 - 5.81 MIL/uL   Hemoglobin 8.7 (L) 13.0 - 17.0 g/dL  HCT 26.6 (L) 39.0 - 52.0 %   MCV 80.9 78.0 - 100.0 fL   MCH 26.4 26.0 - 34.0 pg   MCHC 32.7 30.0 - 36.0 g/dL   RDW 14.1 11.5 - 15.5 %   Platelets 87 (L) 150 - 400 K/uL    Comment: CONSISTENT WITH PREVIOUS RESULT Performed at [facility] [address], [place], [place] [address]   Basic metabolic panel     Status: Abnormal   Collection Time: 03/03/17  2:29 AM  Result Value Ref Range   Sodium 143 135 - 145 mmol/L   Potassium 3.6 3.5 - 5.1 mmol/L   Chloride 110 101 - 111 mmol/L   CO2 20 (L) 22 - 32 mmol/L   Glucose, Bld 93 65 - 99 mg/dL   BUN 22 (H) 6 - 20 mg/dL   Creatinine, Ser 1.15 0.61 - 1.24 mg/dL   Calcium 8.8 (L) 8.9 - 10.3 mg/dL   GFR calc non Af Amer 54 (L) >60 mL/min   GFR calc Af Amer >60 >60 mL/min    Comment: (NOTE) The  eGFR has been calculated using the CKD EPI equation. This calculation has not been validated in all clinical situations. eGFR's persistently <60 mL/min signify possible Chronic Kidney Disease.    Anion gap 13 5 - 15    Comment: Performed at [facility] [address], [place], [place] [address]  Glucose, capillary     Status: None   Collection Time: 03/03/17  4:33 PM  Result Value Ref Range   Glucose-Capillary 94 65 - 99 mg/dL   Comment 1 Notify RN    Comment 2 Document in Chart    Dg Chest Port 1 View  Result Date: 03/02/2017 CLINICAL DATA:  Pneumonia. EXAM: PORTABLE CHEST 1 VIEW COMPARISON:  Chest x-ray dated February 27, 2017. FINDINGS: The heart size and mediastinal contours are within normal limits. Normal pulmonary vascularity. The lungs remain hyperinflated. Relatively unchanged reticulonodular interstitial and patchy alveolar opacities in the left lower lobe. No large pleural effusion. No pneumothorax. No acute osseous abnormality. IMPRESSION: Relatively unchanged left lower lobe infiltrate. Electronically Signed   By: Titus Dubin M.D.   On: 03/02/2017 09:37       Medical Problem List and Plan: 1.  Functional deficits and left hemiparesis secondary to right BG/thalamic infarct   - 2.  DVT Prophylaxis/Anticoagulation: Mechanical:  Antiembolism stockings, knee (TED hose) Bilateral lower extremities 3. Pain Management: tylenol prn 4. Mood: LCSW to follow for evaluation when appropriate 5. Neuropsych: This patient is not capable of making decisions on his own behalf. 6. Skin/Wound Care: routine pressure relief measures.  7. Fluids/Electrolytes/Nutrition: Continue tube feeds. D/C IVF--> change to water flusehes.  8. HTN: Monitor BP bid--SBP goal <140. Change Cardizem to 60 mg tid so can be administered via tube.  9. New diagnosis DM: Hgb A1C-6.4 10. Dysphagia: NPO--K phos added due to low phosphorous levels.  Keep HOB > 30 degrees to prevent aspiration.   11.  Thrombocytopenia: Monitor for signs of bleeding. Has been in 6- 80 range  12. Anemia: Monitor for signs of bleeding --9.6 at admisison--> 8.3 today 13. LLL PNA: on Zosyn D # 3/7   Post Admission Physician Evaluation: 1. Functional deficits secondary  to right BG/thalamic infarct. 2. Patient is admitted to receive collaborative, interdisciplinary care between the physiatrist, rehab nursing staff, and therapy team. 3. Patient's level of medical complexity and substantial therapy needs in context of that medical necessity cannot be provided  at a lesser intensity of care such as a SNF. 4. Patient has experienced substantial functional loss from his/her baseline which was documented above under the "Functional History" and "Functional Status" headings.  Judging by the patient's diagnosis, physical exam, and functional history, the patient has potential for functional progress which will result in measurable gains while on inpatient rehab.  These gains will be of substantial and practical use upon discharge  in facilitating mobility and self-care at the household level. 5. Physiatrist will provide 24 hour management of medical needs as well as oversight of the therapy plan/treatment and provide guidance as appropriate regarding the interaction of the two. 6. The Preadmission Screening has been reviewed and patient status is unchanged unless otherwise stated above. 7. 24 hour rehab nursing will assist with bladder management, bowel management, safety, skin/wound care, disease management, medication administration, pain management and patient education  and help integrate therapy concepts, techniques,education, etc. 8. PT will assess and treat for/with: Lower extremity strength, range of motion, stamina, balance, functional mobility, safety, adaptive techniques and equipment, NMR, visual-spatial awareness.   Goals are: supervision to min assist. 9. OT will assess and treat for/with: ADL's, functional  mobility, safety, upper extremity strength, adaptive techniques and equipment, NMR, visual-spatial awareness, family ed.   Goals are: supervision to min assist. Therapy may proceed with showering this patient. 10. SLP will assess and treat for/with: cognition, communication.  Goals are: supervision to min assist. 11. Case Management and Social Worker will assess and treat for psychological issues and discharge planning. 12. Team conference will be held weekly to assess progress toward goals and to determine barriers to discharge. 13. Patient will receive at least 3 hours of therapy per day at least 5 days per week. 14. ELOS: 17-24 days       15. Prognosis:  excellent/good     Meredith Staggers, MD, [facility] 03/04/2017  Bary Leriche, PA-C 03/03/2017

## 2017-03-03 NOTE — Plan of Care (Signed)
  Progressing Education: Knowledge of General Education information will improve 03/03/2017 1511 - Progressing by Melany GuernseyMcLean, Dusan Lipford M, RN Health Behavior/Discharge Planning: Ability to manage health-related needs will improve 03/03/2017 1511 - Progressing by Melany GuernseyMcLean, Latasha Puskas M, RN Clinical Measurements: Ability to maintain clinical measurements within normal limits will improve 03/03/2017 1511 - Progressing by Melany GuernseyMcLean, Claudis Giovanelli M, RN Will remain free from infection 03/03/2017 1511 - Progressing by Melany GuernseyMcLean, Lacrecia Delval M, RN Diagnostic test results will improve 03/03/2017 1511 - Progressing by Melany GuernseyMcLean, Clotee Schlicker M, RN Respiratory complications will improve 03/03/2017 1511 - Progressing by Melany GuernseyMcLean, Zykira Matlack M, RN Cardiovascular complication will be avoided 03/03/2017 1511 - Progressing by Melany GuernseyMcLean, Callahan Peddie M, RN Activity: Risk for activity intolerance will decrease 03/03/2017 1511 - Progressing by Melany GuernseyMcLean, Hadlie Gipson M, RN Nutrition: Adequate nutrition will be maintained 03/03/2017 1511 - Progressing by Melany GuernseyMcLean, Skilynn Durney M, RN Elimination: Will not experience complications related to bowel motility 03/03/2017 1511 - Progressing by Melany GuernseyMcLean, Jaimie Redditt M, RN Will not experience complications related to urinary retention 03/03/2017 1511 - Progressing by Melany GuernseyMcLean, Annamae Shivley M, RN Pain Managment: General experience of comfort will improve 03/03/2017 1511 - Progressing by Melany GuernseyMcLean, Xzavion Doswell M, RN Safety: Ability to remain free from injury will improve 03/03/2017 1511 - Progressing by Melany GuernseyMcLean, Elvera Almario M, RN Skin Integrity: Risk for impaired skin integrity will decrease 03/03/2017 1511 - Progressing by Melany GuernseyMcLean, Doylene Splinter M, RN Self-Care: Ability to participate in self-care as condition permits will improve 03/03/2017 1511 - Progressing by Melany GuernseyMcLean, Facundo Allemand M, RN Verbalization of feelings and concerns over difficulty with self-care will improve 03/03/2017 1511 - Progressing by Melany GuernseyMcLean, Laikynn Pollio M, RN Ability to communicate  needs accurately will improve 03/03/2017 1511 - Progressing by Melany GuernseyMcLean, Lukah Goswami M, RN Nutrition: Risk of aspiration will decrease 03/03/2017 1511 - Progressing by Melany GuernseyMcLean, Refoel Palladino M, RN Dietary intake will improve 03/03/2017 1511 - Progressing by Melany GuernseyMcLean, Naquisha Whitehair M, RN Intracerebral Hemorrhage Tissue Perfusion: Complications of Intracerebral Hemorrhage will be minimized 03/03/2017 1511 - Progressing by Melany GuernseyMcLean, Robby Pirani M, RN

## 2017-03-04 ENCOUNTER — Inpatient Hospital Stay (HOSPITAL_COMMUNITY)
Admission: RE | Admit: 2017-03-04 | Discharge: 2017-03-04 | DRG: 056 | Disposition: A | Discharge status: Other Acute Inpatient Hospital | Payer: Medicare Other | Source: Intra-hospital | Attending: Physical Medicine & Rehabilitation | Admitting: Physical Medicine & Rehabilitation

## 2017-03-04 ENCOUNTER — Other Ambulatory Visit: Payer: Self-pay

## 2017-03-04 ENCOUNTER — Encounter (HOSPITAL_COMMUNITY): Payer: Self-pay | Admitting: Internal Medicine

## 2017-03-04 ENCOUNTER — Inpatient Hospital Stay (HOSPITAL_COMMUNITY): Payer: Medicare Other

## 2017-03-04 ENCOUNTER — Inpatient Hospital Stay (HOSPITAL_COMMUNITY)
Admission: AD | Admit: 2017-03-04 | Discharge: 2017-03-07 | Disposition: A | Discharge status: Nursing Home (Includes ICF) | Payer: Medicare Other | Source: Ambulatory Visit | Attending: Family Medicine | Admitting: Family Medicine

## 2017-03-04 DIAGNOSIS — N183 Chronic kidney disease, stage 3 unspecified: Secondary | ICD-10-CM | POA: Diagnosis present

## 2017-03-04 DIAGNOSIS — R000 Tachycardia, unspecified: Secondary | ICD-10-CM | POA: Diagnosis not present

## 2017-03-04 DIAGNOSIS — Z515 Encounter for palliative care: Secondary | ICD-10-CM

## 2017-03-04 DIAGNOSIS — J189 Pneumonia, unspecified organism: Secondary | ICD-10-CM | POA: Diagnosis present

## 2017-03-04 DIAGNOSIS — R0902 Hypoxemia: Secondary | ICD-10-CM | POA: Diagnosis not present

## 2017-03-04 DIAGNOSIS — J9601 Acute respiratory failure with hypoxia: Secondary | ICD-10-CM

## 2017-03-04 DIAGNOSIS — R1310 Dysphagia, unspecified: Secondary | ICD-10-CM | POA: Diagnosis present

## 2017-03-04 DIAGNOSIS — Z87891 Personal history of nicotine dependence: Secondary | ICD-10-CM

## 2017-03-04 DIAGNOSIS — R0603 Acute respiratory distress: Secondary | ICD-10-CM

## 2017-03-04 DIAGNOSIS — E43 Unspecified severe protein-calorie malnutrition: Secondary | ICD-10-CM | POA: Diagnosis present

## 2017-03-04 DIAGNOSIS — I10 Essential (primary) hypertension: Secondary | ICD-10-CM | POA: Diagnosis present

## 2017-03-04 DIAGNOSIS — I69354 Hemiplegia and hemiparesis following cerebral infarction affecting left non-dominant side: Secondary | ICD-10-CM | POA: Diagnosis present

## 2017-03-04 DIAGNOSIS — A419 Sepsis, unspecified organism: Secondary | ICD-10-CM | POA: Diagnosis present

## 2017-03-04 DIAGNOSIS — E1122 Type 2 diabetes mellitus with diabetic chronic kidney disease: Secondary | ICD-10-CM | POA: Diagnosis present

## 2017-03-04 DIAGNOSIS — J690 Pneumonitis due to inhalation of food and vomit: Secondary | ICD-10-CM | POA: Diagnosis present

## 2017-03-04 DIAGNOSIS — I610 Nontraumatic intracerebral hemorrhage in hemisphere, subcortical: Secondary | ICD-10-CM | POA: Diagnosis present

## 2017-03-04 DIAGNOSIS — I619 Nontraumatic intracerebral hemorrhage, unspecified: Secondary | ICD-10-CM | POA: Diagnosis present

## 2017-03-04 DIAGNOSIS — D649 Anemia, unspecified: Secondary | ICD-10-CM | POA: Diagnosis present

## 2017-03-04 DIAGNOSIS — Z79899 Other long term (current) drug therapy: Secondary | ICD-10-CM

## 2017-03-04 DIAGNOSIS — D696 Thrombocytopenia, unspecified: Secondary | ICD-10-CM | POA: Diagnosis present

## 2017-03-04 DIAGNOSIS — I129 Hypertensive chronic kidney disease with stage 1 through stage 4 chronic kidney disease, or unspecified chronic kidney disease: Secondary | ICD-10-CM | POA: Diagnosis present

## 2017-03-04 DIAGNOSIS — R0602 Shortness of breath: Secondary | ICD-10-CM

## 2017-03-04 DIAGNOSIS — I5189 Other ill-defined heart diseases: Secondary | ICD-10-CM | POA: Diagnosis present

## 2017-03-04 DIAGNOSIS — F0390 Unspecified dementia without behavioral disturbance: Secondary | ICD-10-CM | POA: Diagnosis present

## 2017-03-04 DIAGNOSIS — I519 Heart disease, unspecified: Secondary | ICD-10-CM | POA: Diagnosis present

## 2017-03-04 LAB — BLOOD GAS, ARTERIAL
Acid-base deficit: 2.4 mmol/L — ABNORMAL HIGH (ref 0.0–2.0)
BICARBONATE: 20.4 mmol/L (ref 20.0–28.0)
Drawn by: 51147
O2 Saturation: 91.8 %
PCO2 ART: 26.4 mmHg — AB (ref 32.0–48.0)
PH ART: 7.498 — AB (ref 7.350–7.450)
Patient temperature: 98
pO2, Arterial: 60.9 mmHg — ABNORMAL LOW (ref 83.0–108.0)

## 2017-03-04 LAB — CBC
HEMATOCRIT: 25.4 % — AB (ref 39.0–52.0)
HEMOGLOBIN: 8.3 g/dL — AB (ref 13.0–17.0)
MCH: 26.6 pg (ref 26.0–34.0)
MCHC: 32.7 g/dL (ref 30.0–36.0)
MCV: 81.4 fL (ref 78.0–100.0)
Platelets: 78 10*3/uL — ABNORMAL LOW (ref 150–400)
RBC: 3.12 MIL/uL — ABNORMAL LOW (ref 4.22–5.81)
RDW: 14.5 % (ref 11.5–15.5)
WBC: 13.8 10*3/uL — ABNORMAL HIGH (ref 4.0–10.5)

## 2017-03-04 LAB — BASIC METABOLIC PANEL
Anion gap: 13 (ref 5–15)
BUN: 26 mg/dL — AB (ref 6–20)
CHLORIDE: 113 mmol/L — AB (ref 101–111)
CO2: 21 mmol/L — AB (ref 22–32)
CREATININE: 1.26 mg/dL — AB (ref 0.61–1.24)
Calcium: 8.9 mg/dL (ref 8.9–10.3)
GFR calc Af Amer: 56 mL/min — ABNORMAL LOW (ref >60)
GFR calc non Af Amer: 48 mL/min — ABNORMAL LOW (ref >60)
Glucose, Bld: 154 mg/dL — ABNORMAL HIGH (ref 65–99)
Potassium: 3.3 mmol/L — ABNORMAL LOW (ref 3.5–5.1)
Sodium: 147 mmol/L — ABNORMAL HIGH (ref 135–145)

## 2017-03-04 LAB — CULTURE, BLOOD (ROUTINE X 2): CULTURE: NO GROWTH

## 2017-03-04 LAB — MAGNESIUM: Magnesium: 2.2 mg/dL (ref 1.7–2.4)

## 2017-03-04 LAB — GLUCOSE, CAPILLARY: Glucose-Capillary: 123 mg/dL — ABNORMAL HIGH (ref 65–99)

## 2017-03-04 LAB — PHOSPHORUS: PHOSPHORUS: 2.1 mg/dL — AB (ref 2.5–4.6)

## 2017-03-04 MED ORDER — CHLORHEXIDINE GLUCONATE 0.12 % MT SOLN
15.0000 mL | Freq: Two times a day (BID) | OROMUCOSAL | Status: DC
  Filled 2017-03-04: qty 15

## 2017-03-04 MED ORDER — BOOST PO LIQD
237.0000 mL | ORAL | Status: DC
  Filled 2017-03-04 (×2): qty 237

## 2017-03-04 MED ORDER — ADULT MULTIVITAMIN LIQUID CH
15.0000 mL | Freq: Every day | ORAL | Status: DC
  Filled 2017-03-04 (×2): qty 15

## 2017-03-04 MED ORDER — POTASSIUM CHLORIDE 10 MEQ/100ML IV SOLN
10.0000 meq | INTRAVENOUS | Status: AC
  Administered 2017-03-04 – 2017-03-05 (×2): 10 meq via INTRAVENOUS
  Filled 2017-03-04 (×2): qty 100

## 2017-03-04 MED ORDER — FREE WATER
200.0000 mL | Freq: Three times a day (TID) | Status: DC
  Administered 2017-03-04: 200 mL

## 2017-03-04 MED ORDER — PANTOPRAZOLE SODIUM 40 MG IV SOLR
40.0000 mg | Freq: Every day | INTRAVENOUS | Status: DC
  Filled 2017-03-04: qty 40

## 2017-03-04 MED ORDER — ACETAMINOPHEN 650 MG RE SUPP
650.0000 mg | Freq: Four times a day (QID) | RECTAL | Status: DC | PRN
  Administered 2017-03-04 – 2017-03-05 (×2): 650 mg via RECTAL
  Filled 2017-03-04 (×2): qty 1

## 2017-03-04 MED ORDER — IPRATROPIUM BROMIDE 0.02 % IN SOLN
0.5000 mg | RESPIRATORY_TRACT | Status: DC
  Administered 2017-03-04 – 2017-03-05 (×2): 0.5 mg via RESPIRATORY_TRACT
  Filled 2017-03-04 (×2): qty 2.5

## 2017-03-04 MED ORDER — DILTIAZEM HCL ER COATED BEADS 120 MG PO CP24
120.0000 mg | ORAL_CAPSULE | Freq: Every day | ORAL | Status: DC
  Filled 2017-03-04: qty 1

## 2017-03-04 MED ORDER — LEVALBUTEROL HCL 1.25 MG/0.5ML IN NEBU
1.2500 mg | INHALATION_SOLUTION | Freq: Four times a day (QID) | RESPIRATORY_TRACT | Status: DC
  Administered 2017-03-04 – 2017-03-05 (×4): 1.25 mg via RESPIRATORY_TRACT
  Filled 2017-03-04 (×6): qty 0.5

## 2017-03-04 MED ORDER — VITAMIN D 1000 UNITS PO TABS
1000.0000 [IU] | ORAL_TABLET | ORAL | Status: DC
  Filled 2017-03-04: qty 1

## 2017-03-04 MED ORDER — DORZOLAMIDE HCL-TIMOLOL MAL 2-0.5 % OP SOLN
1.0000 [drp] | Freq: Two times a day (BID) | OPHTHALMIC | Status: DC
  Filled 2017-03-04: qty 10

## 2017-03-04 MED ORDER — ALUM & MAG HYDROXIDE-SIMETH 200-200-20 MG/5ML PO SUSP: 30.0000 mL | ORAL | Status: DC | PRN

## 2017-03-04 MED ORDER — INSULIN ASPART 100 UNIT/ML ~~LOC~~ SOLN
0.0000 [IU] | Freq: Three times a day (TID) | SUBCUTANEOUS | Status: DC
  Administered 2017-03-04: 1 [IU] via SUBCUTANEOUS

## 2017-03-04 MED ORDER — PIPERACILLIN-TAZOBACTAM 3.375 G IVPB
3.3750 g | Freq: Three times a day (TID) | INTRAVENOUS | Status: DC
  Filled 2017-03-04 (×2): qty 50

## 2017-03-04 MED ORDER — CENTRUM PO LIQD: 5.0000 mL | Freq: Every day | ORAL | Status: DC

## 2017-03-04 MED ORDER — DM-GUAIFENESIN ER 30-600 MG PO TB12: 1.0000 | ORAL_TABLET | Freq: Two times a day (BID) | ORAL | Status: DC | PRN

## 2017-03-04 MED ORDER — K PHOS MONO-SOD PHOS DI & MONO 155-852-130 MG PO TABS
500.0000 mg | ORAL_TABLET | Freq: Three times a day (TID) | ORAL | Status: DC
Start: 2017-03-04 — End: 2017-03-04
  Administered 2017-03-04: 500 mg
  Filled 2017-03-04 (×3): qty 2

## 2017-03-04 MED ORDER — POLYETHYLENE GLYCOL 3350 17 G PO PACK
17.0000 g | PACK | Freq: Every day | ORAL | Status: DC | PRN
  Administered 2017-03-04: 17 g
  Filled 2017-03-04: qty 1

## 2017-03-04 MED ORDER — SENNOSIDES-DOCUSATE SODIUM 8.6-50 MG PO TABS
1.0000 | ORAL_TABLET | Freq: Two times a day (BID) | ORAL | Status: DC
  Filled 2017-03-04: qty 1

## 2017-03-04 MED ORDER — ORAL CARE MOUTH RINSE
15.0000 mL | Freq: Two times a day (BID) | OROMUCOSAL | Status: DC
  Administered 2017-03-04: 15 mL via OROMUCOSAL

## 2017-03-04 MED ORDER — PROCHLORPERAZINE 25 MG RE SUPP: 12.5000 mg | Freq: Four times a day (QID) | RECTAL | Status: DC | PRN

## 2017-03-04 MED ORDER — LATANOPROST 0.005 % OP SOLN
1.0000 [drp] | Freq: Every day | OPHTHALMIC | Status: DC
  Administered 2017-03-05 – 2017-03-06 (×3): 1 [drp] via OPHTHALMIC
  Filled 2017-03-04: qty 2.5

## 2017-03-04 MED ORDER — JEVITY 1.2 CAL PO LIQD
1000.0000 mL | ORAL | Status: DC
  Filled 2017-03-04 (×2): qty 1000

## 2017-03-04 MED ORDER — ATORVASTATIN CALCIUM 20 MG PO TABS
20.0000 mg | ORAL_TABLET | Freq: Every day | ORAL | Status: DC
Start: 2017-03-04 — End: 2017-03-04
  Administered 2017-03-04: 20 mg
  Filled 2017-03-04: qty 1

## 2017-03-04 MED ORDER — POTASSIUM PHOSPHATE MONOBASIC 500 MG PO TABS
500.0000 mg | ORAL_TABLET | Freq: Three times a day (TID) | ORAL | Status: DC
  Filled 2017-03-04: qty 1

## 2017-03-04 MED ORDER — DORZOLAMIDE HCL-TIMOLOL MAL 2-0.5 % OP SOLN
1.0000 [drp] | Freq: Two times a day (BID) | OPHTHALMIC | Status: DC
  Administered 2017-03-05 – 2017-03-07 (×6): 1 [drp] via OPHTHALMIC
  Filled 2017-03-04: qty 10

## 2017-03-04 MED ORDER — BISACODYL 10 MG RE SUPP: 10.0000 mg | Freq: Every day | RECTAL | Status: DC | PRN

## 2017-03-04 MED ORDER — GUAIFENESIN-DM 100-10 MG/5ML PO SYRP: 5.0000 mL | ORAL_SOLUTION | Freq: Four times a day (QID) | ORAL | Status: DC | PRN

## 2017-03-04 MED ORDER — PIPERACILLIN-TAZOBACTAM 3.375 G IVPB
3.3750 g | Freq: Three times a day (TID) | INTRAVENOUS | Status: DC
  Administered 2017-03-04 – 2017-03-06 (×6): 3.375 g via INTRAVENOUS
  Filled 2017-03-04 (×7): qty 50

## 2017-03-04 MED ORDER — ACETAMINOPHEN 325 MG PO TABS: 325.0000 mg | ORAL_TABLET | ORAL | Status: DC | PRN

## 2017-03-04 MED ORDER — LATANOPROST 0.005 % OP SOLN
1.0000 [drp] | Freq: Every day | OPHTHALMIC | Status: DC
Start: 2017-03-04 — End: 2017-03-04
  Filled 2017-03-04: qty 2.5

## 2017-03-04 MED ORDER — K PHOS MONO-SOD PHOS DI & MONO 155-852-130 MG PO TABS
500.0000 mg | ORAL_TABLET | Freq: Three times a day (TID) | ORAL | Status: DC
  Administered 2017-03-04: 500 mg via ORAL
  Filled 2017-03-04 (×3): qty 2

## 2017-03-04 MED ORDER — POTASSIUM CHLORIDE CRYS ER 20 MEQ PO TBCR: 20.0000 meq | EXTENDED_RELEASE_TABLET | Freq: Two times a day (BID) | ORAL | Status: DC

## 2017-03-04 MED ORDER — DILTIAZEM HCL ER COATED BEADS 180 MG PO CP24: 180.0000 mg | ORAL_CAPSULE | Freq: Every day | ORAL | Status: DC

## 2017-03-04 MED ORDER — PROCHLORPERAZINE MALEATE 5 MG PO TABS: 5.0000 mg | ORAL_TABLET | Freq: Four times a day (QID) | ORAL | Status: DC | PRN

## 2017-03-04 MED ORDER — FLEET ENEMA 7-19 GM/118ML RE ENEM
1.0000 | ENEMA | Freq: Once | RECTAL | Status: AC | PRN
  Administered 2017-03-04: 1 via RECTAL
  Filled 2017-03-04: qty 1

## 2017-03-04 MED ORDER — PROCHLORPERAZINE EDISYLATE 5 MG/ML IJ SOLN: 5.0000 mg | Freq: Four times a day (QID) | INTRAMUSCULAR | Status: DC | PRN

## 2017-03-04 MED ORDER — DILTIAZEM HCL 60 MG PO TABS: 60.0000 mg | ORAL_TABLET | Freq: Three times a day (TID) | ORAL | Status: DC

## 2017-03-04 MED ORDER — LEVALBUTEROL HCL 0.63 MG/3ML IN NEBU
0.6300 mg | INHALATION_SOLUTION | Freq: Four times a day (QID) | RESPIRATORY_TRACT | Status: DC | PRN
  Administered 2017-03-04: 0.63 mg via RESPIRATORY_TRACT
  Filled 2017-03-04: qty 3

## 2017-03-04 MED ORDER — RISAQUAD PO CAPS: 1.0000 | ORAL_CAPSULE | ORAL | Status: DC

## 2017-03-04 NOTE — Progress Notes (Signed)
Discussed code status with wife and daughter. They do not want a feeding tube but they want him to be full code.

## 2017-03-04 NOTE — Progress Notes (Signed)
Physical Medicine and Rehabilitation Consult  Reason for Consult:Decreased functional mobility  Referring Physician: Dr Roda Shutters  HPI: Cesar Maldonado is a 84 y.o. right handed male with history of dementia, hypertension. Per chart review patient lives with spouse. Independent prior to admission. Daughter helps with finances. One level home. Presented 02/28/2017 with altered mental status decrease in appetite over the past 1-2 days. CT the head showed acute right basal ganglia/ thalamus hematoma. No reports of trauma. Blood pressure with systolics in the 140s in the ED. CTA of head did not show any aneurysm or AVM. Echocardiogram is pending.MRI pending. Physical therapy evaluation completed 02/28/2017 with recommendations of physical medicine rehabilitation consult.  Review of Systems  Constitutional: Positive for weight loss. Negative for fever.  Decreased appetite over the past couple of days  HENT: Negative for hearing loss.  Respiratory: Negative for cough and shortness of breath.  Cardiovascular: Negative for chest pain, palpitations and leg swelling.  Gastrointestinal: Positive for constipation. Negative for nausea.  Genitourinary: Positive for urgency.  Musculoskeletal: Negative for myalgias.  Psychiatric/Behavioral: Positive for memory loss.  All other systems reviewed and are negative.       Past Medical History:  Diagnosis Date  . BPH (benign prostatic hyperplasia)   . 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 Dementia   . Hypertension   . Left ventricular diastolic dysfunction, NYHA class 1   . Mental disorder    alzhiemers  . Syncope    Cardionet 04/14/12        Past Surgical History:  Procedure Laterality Date  . BREAST SURGERY     left tumor  . PARS PLANA VITRECTOMY  12/19/2010   Procedure: PARS PLANA VITRECTOMY WITH INTRAOCULAR LENS; Surgeon: Shade Flood, MD; Location: [facility] OR; Service: Ophthalmology;  Laterality: Right; PARS PLANA VITRECTOMY WITH INTRAOCULAR LENS [900]membrane peel,endolaser right eye        Family History  Problem Relation Age of Onset  . Cancer Sister    Social History: reports that he quit smoking about 29 years ago. he has never used smokeless tobacco. He reports that he does not drink alcohol or use drugs.  Allergies: No Known Allergies        Medications Prior to Admission  Medication Sig Dispense Refill  . acidophilus (RISAQUAD) CAPS capsule Take 1 capsule by mouth every other day.    . cholecalciferol (VITAMIN D) 1000 units tablet Take 1,000 Units by mouth every other day.    . diltiazem (CARTIA XT) 120 MG 24 hr capsule Take 120 mg by mouth daily.    . dorzolamide-timolol (COSOPT) 22.3-6.8 MG/ML ophthalmic solution Place 1 drop into both eyes 2 (two) times daily.     Marland Kitchen lactose free nutrition (BOOST) LIQD Take 237 mLs by mouth every other day.     . latanoprost (XALATAN) 0.005 % ophthalmic solution Place 1 drop into both eyes at bedtime.    . Multiple Vitamins-Minerals (CENTRUM PO) Take 1 tablet by mouth every other day.      Home:  Home Living  Family/patient expects to be discharged to:: Private residence  Living Arrangements: Spouse/significant other  Available Help at Discharge: Family  Type of Home: House  Home Access: Stairs to enter  Entrance Stairs-Rails: Right  Home Layout: One level  Bathroom Shower/Tub: Medical sales representative: Standard  Home Equipment: Shower seat, Grab bars - tub/shower  Additional Comments: home setup from prior admission and function from  RN reported by family  Functional History:  Prior Function  Level of Independence: Independent  Comments: lives with wife, daughter helps with bills and checks on parents  Functional Status:  Mobility:  Bed Mobility  Overal bed mobility: Needs Assistance  Bed Mobility: Supine to Sit  Supine to sit: Min assist, +2 for physical assistance, HOB elevated  General bed mobility  comments: cues to initiate with assist to move legs to edge and elevate trunk  Transfers  Overall transfer level: Needs assistance  Transfers: Sit to/from Stand, Stand Pivot Transfers  Sit to Stand: Mod assist, +2 safety/equipment, +2 physical assistance  Stand pivot transfers: Max assist, +2 physical assistance, +2 safety/equipment  General transfer comment: mod assist to stand from bed, chair and BSC with cues for hand placement, pt unable to scoot forward first and stands from back on surface. Pt with assist to rise, extend hips and achieve anterior translation. With pivot max assist to move hand from surface to surface, control hips and pivot to chair with multimodal cues  Ambulation/Gait  Ambulation/Gait assistance: Max assist, +2 physical assistance  Ambulation Distance (Feet): 2 Feet  Assistive device: 2 person hand held assist  Gait Pattern/deviations: Narrow base of support, Scissoring  General Gait Details: pt with posterior lean with narrow BOS and scissoring with bil UE assist to maintain balance and posture x 3' with assist to bring chair to pt as he could not step backward. Attempted RW for 2nd trial but pt maintains increased flexed posture with RW too anterior and only took 1 step with chair pulled to him.  Gait velocity interpretation: Below normal speed for age/gender   ADL:   Cognition:  Cognition  Overall Cognitive Status: No family/caregiver present to determine baseline cognitive functioning  Orientation Level: Oriented to person  Cognition  Arousal/Alertness: Awake/alert  Behavior During Therapy: Flat affect  Overall Cognitive Status: No family/caregiver present to determine baseline cognitive functioning  Area of Impairment: Orientation, Attention, Memory, Following commands, Safety/judgement  Orientation Level: Disoriented to, Time, Situation, Place  Current Attention Level: Sustained  Memory: Decreased short-term memory, Decreased recall of precautions   Following Commands: Follows one step commands inconsistently, Follows one step commands with increased time  Safety/Judgement: Decreased awareness of safety, Decreased awareness of deficits  Blood pressure 114/65, pulse 85, temperature 98.1 F (36.7 C), temperature source Oral, resp. rate (!) 24, height 5\' 8"  (1.727 m), weight 47 kg (103 lb 9.9 oz), SpO2 95 %.  Physical Exam  Vitals reviewed.  Constitutional:  84 year old right-handed male sitting up in chair  HENT:  Head: Normocephalic.  Eyes:  Pupils reactive to light  Neck: Normal range of motion. Neck supple. No thyromegaly present.  Cardiovascular: Normal rate, regular rhythm and normal heart sounds.  Respiratory: Effort normal and breath sounds normal. No respiratory distress.  GI: Soft. Bowel sounds are normal. He exhibits no distension.  Neurological:  Alert. Makes eye contact. Thought he was at "home". He appears to have a bit of a right gaze preference. He did provide his name and age. Followed simple commands. LUE 2-3/5 prox to distal. LLE: 3/5. Some left inattention. RUE and RLE 3 to 3+/5. Minimal withdrawal in pain in all 4 limbs.  Skin: Skin is warm and dry.  Psychiatric:  flat   Lab Results Last 24 Hours  Imaging Results (Last 48 hours)     Assessment/Plan:  Diagnosis: Right basal ganglia/thalamic hemorrhage  1. Does the need for close, 24 hr/day medical supervision in concert with the patient's rehab needs make it unreasonable for this patient to be served in a less intensive setting? Yes and Potentially 2. Co-Morbidities requiring supervision/potential complications: dementia 3. Due to bladder management, bowel management, safety, skin/wound care, disease management, medication administration and patient education, does the patient require 24 hr/day rehab nursing? Yes and Potentially 4. Does the patient require coordinated care of a physician, rehab nurse, PT (1-2 hrs/day, 5 days/week), OT (1-2 hrs/day, 5 days/week) and SLP (1-2 hrs/day, 5 days/week) to address physical and functional deficits in the context of the above medical diagnosis(es)? Yes Addressing deficits in the following areas: balance, endurance, locomotion, strength, transferring, bowel/bladder control, bathing, dressing, feeding, grooming, toileting, cognition, speech, language, swallowing and psychosocial support 5. Can the patient actively participate in an intensive therapy program of at least 3 hrs of therapy per day at least 5 days per week? Yes and Potentially 6. The potential for patient to make measurable gains while on inpatient rehab is potentially good 7. Anticipated functional outcomes upon discharge from inpatient rehab are supervision and min assist with PT, supervision and min assist with OT, supervision and min assist with SLP. 8. Estimated rehab length of stay to reach the above functional goals is: potentially 17-24 days 9. Anticipated D/C setting: Home 10. Anticipated post D/C treatments: HH  therapy and Outpatient therapy 11. Overall Rehab/Functional Prognosis: good RECOMMENDATIONS:  This patient's condition is appropriate for continued rehabilitative care in the following setting: potentially CIR. pt just admitted last night. will follow along as work up proceeds.  Patient has agreed to participate in recommended program. N/A  Note that insurance prior authorization may be required for reimbursement for recommended care.  Comment:  Ranelle OysterZachary T. Swartz, MD, Louisville Va Medical CenterFAAPMR  [facility]  02/28/2017  Mcarthur Rossettianiel J Angiulli, PA-C  02/28/2017

## 2017-03-04 NOTE — Progress Notes (Signed)
I have insurance approval to admit pt to inpt rehab today. I notified Daughter by phone and she is in agreement. I will meet with she and her Mom at bedside to complete his admission upon their arrival this morning. Corrie DandyMary, NP for stroke service made aware, as well as RN CM and SW. I will make the arrangements to admit today. [phone]

## 2017-03-04 NOTE — Progress Notes (Signed)
Pharmacy Antibiotic Note  Cesar Maldonado is a 84 y.o. male admitted on 03/04/2017 from CIR after emesis and an aspiration event. Patient had recent stroke. Pharmacy has been consulted for Zosyn dosing for aspiration pneumonia. SCr 1.26, CrCl ~28 ml/min. WBC elevated at 13.8, Tm 101.8.  Plan: Zosyn 3.375g IV q8h (4 hour infusion).  Monitor renal function, clinical progress     Temp (24hrs), Avg:99.1 F (37.3 C), Min:98 F (36.7 C), Max:101.8 F (38.8 C)  Recent Labs  Lab 02/27/17 1906 02/27/17 2019 02/28/17 0916 03/01/17 0246 03/02/17 0943 03/03/17 0229 03/04/17 0421  WBC  --   --  7.4 6.8 10.6* 11.1* 13.8*  CREATININE  --   --  1.19 1.20 1.12 1.15 1.26*  LATICACIDVEN 3.73* 3.15*  --   --   --   --   --     Estimated Creatinine Clearance: 28.2 mL/min (A) (by C-G formula based on SCr of 1.26 mg/dL (H)).    No Known Allergies    Thank you for allowing pharmacy to be a part of this patient's care.  Loura BackJennifer [place], PharmD, BCPS Clinical Pharmacist Phone for today [phone] Main pharmacy - [phone] 03/04/2017 10:30 PM

## 2017-03-04 NOTE — Discharge Summary (Signed)
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 BPH (benign prostatic hyperplasia)   . 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 Dementia   . Hypertension   . Left ventricular diastolic dysfunction, NYHA class 1   . Mental disorder    alzhiemers  . Syncope    Cardionet 04/14/12   Past Surgical History:  Procedure Laterality Date  . BREAST SURGERY     left tumor  . PARS PLANA VITRECTOMY  12/19/2010   Procedure: PARS PLANA VITRECTOMY WITH INTRAOCULAR LENS;  Surgeon: Shade FloodGreer Geiger, MD;  [facility];  Service: Ophthalmology;  Laterality: Right;  PARS PLANA VITRECTOMY WITH INTRAOCULAR LENS [900]membrane peel,endolaser right eye    Medications to be continued on Rehab . atorvastatin  20 mg Oral q1800  . chlorhexidine  15 mL Mouth Rinse BID  . diltiazem  180 mg Oral Daily  . dorzolamide-timolol  1 drop Both Eyes BID  . free water  30 mL Per Tube Q6H  . latanoprost  1 drop Both Eyes QHS  . mouth rinse  15 mL Mouth Rinse q12n4p  . pantoprazole (PROTONIX) IV  40 mg Intravenous QHS  . phosphorus  500 mg Oral TID AC & HS  . senna-docusate  1 tablet Oral BID    LABORATORY STUDIES CBC    Component Value Date/Time    WBC 13.8 (H) 03/04/2017 0421   RBC 3.12 (L) 03/04/2017 0421   HGB 8.3 (L) 03/04/2017 0421   HCT 25.4 (L) 03/04/2017 0421   PLT 78 (L) 03/04/2017 0421   MCV 81.4 03/04/2017 0421   MCH 26.6 03/04/2017 0421   MCHC 32.7 03/04/2017 0421   RDW 14.5 03/04/2017 0421   RDW 16.6 (H) 04/11/2014 1007   LYMPHSABS 0.5 (L) 02/27/2017 1850   MONOABS 0.3 02/27/2017 1850   EOSABS 0.0 02/27/2017 1850   BASOSABS 0.0 02/27/2017 1850   CMP    Component Value Date/Time   NA 147 (H) 03/04/2017 0421   NA 139 04/11/2014 1007   K 3.3 (L) 03/04/2017 0421   CL 113 (H) 03/04/2017 0421   CO2 21 (L) 03/04/2017 0421   GLUCOSE 154 (H) 03/04/2017 0421   BUN 26 (H) 03/04/2017 0421   BUN 15 04/11/2014 1007   CREATININE 1.26 (H) 03/04/2017 0421   CALCIUM 8.9 03/04/2017 0421   PROT 7.4 02/27/2017 1850   PROT 7.2 04/11/2014 1007   ALBUMIN 3.2 (L) 02/27/2017 1850   ALBUMIN 4.2 04/11/2014 1007   AST 71 (H) 02/27/2017 1850   ALT 18 02/27/2017 1850   ALKPHOS 97 02/27/2017 1850   BILITOT 0.7 02/27/2017 1850   BILITOT 0.3 04/11/2014 1007   GFRNONAA 48 (L) 03/04/2017 0421   GFRAA 56 (L) 03/04/2017 0421   COAGS Lab Results  Component Value Date   INR  1.04 07/23/2015   INR 0.97 09/02/2010   Lipid Panel    Component Value Date/Time   CHOL 176 02/28/2017 0916   CHOL 189 04/11/2014 1007   TRIG 115 02/28/2017 0916   HDL 33 (L) 02/28/2017 0916   HDL 76 04/11/2014 1007   CHOLHDL 5.3 02/28/2017 0916   VLDL 23 02/28/2017 0916   LDLCALC 120 (H) 02/28/2017 0916   LDLCALC 101 (H) 04/11/2014 1007   HgbA1C  Lab Results  Component Value Date   HGBA1C 6.4 (H) 02/28/2017   Urinalysis    Component Value Date/Time   COLORURINE YELLOW 02/28/2017 0620   APPEARANCEUR CLOUDY (A) 02/28/2017 0620   LABSPEC 1.029 02/28/2017 0620   PHURINE 5.0 02/28/2017 0620   GLUCOSEU NEGATIVE 02/28/2017 0620   HGBUR NEGATIVE 02/28/2017 0620   BILIRUBINUR NEGATIVE 02/28/2017 0620   KETONESUR 5 (A) 02/28/2017 0620   PROTEINUR 30  (A) 02/28/2017 0620   UROBILINOGEN 0.2 10/21/2013 0928   NITRITE NEGATIVE 02/28/2017 0620   LEUKOCYTESUR NEGATIVE 02/28/2017 0620   Urine Drug Screen     Component Value Date/Time   LABOPIA NONE DETECTED 07/23/2015 2101   COCAINSCRNUR NONE DETECTED 07/23/2015 2101   LABBENZ NONE DETECTED 07/23/2015 2101   AMPHETMU NONE DETECTED 07/23/2015 2101   THCU NONE DETECTED 07/23/2015 2101   LABBARB NONE DETECTED 07/23/2015 2101    Alcohol Level    Component Value Date/Time   ETH <5 07/23/2015 2000   SIGNIFICANT DIAGNOSTIC STUDIES Ct Angio Head W Or Wo Contrast 02/28/2017 IMPRESSION:   CT HEAD:  1. Stable evolving acute RIGHT basal ganglia/thalamus hematoma with intraventricular extension.              No hydrocephalus.  2. Stable regional mass effect with 5 mm RIGHT to LEFT midline shift.  3. Stable severe atrophy and chronic small vessel ischemic disease.    CTA HEAD:  1. No emergent large vessel occlusion, no aneurysm.  2. Intracranial atherosclerosis resulting in multifocal severe stenosis anterior and posterior circulation.   Dg Chest 2 View 03/02/2017 IMPRESSION: Relatively unchanged left lower lobe infiltrate.  Dg Chest 2 View 02/27/2017 IMPRESSION:  Left lower lobe pneumonia and bilateral pleural effusions.   Dg Chest 2 View 02/23/2017 Small bilateral pleural effusions.   COPD.   Dg Abd 1 View 02/17/2017 IMPRESSION:  Negative.   Ct Head Wo Contrast 02/27/2017 IMPRESSION:  1. ~20 cc hematoma centered in the right basal ganglia and internal capsule. There is intraventricular hemorrhage with blood layering in the occipital horns of lateral ventricles.  2. Severe brain atrophy with chronic small vessel ischemia.    Ct Head Wo Contrast 02/23/2017 IMPRESSION:  1. No evidence of acute intracranial abnormality  2. Atrophy, chronic small-vessel white matter ischemic changes and remote left basal ganglia infarct.    Mr Brain Wo  Contrast 03/01/2017 IMPRESSION:  1. Moderately motion degraded examination.  2. Evolving acute RIGHT basal ganglia/thalamus hematoma with similar mass effect, 4 mm RIGHT to LEFT midline shift without ventricular entrapment.  3. Small volume redistributed subarachnoid hemorrhage. Similar layering blood products occipital horns. No hydrocephalus.  4. Severe parenchymal brain volume loss and chronic small vessel ischemic disease. Old LEFT basal ganglia lacunar infarct.   Vas US Carotid 02/28/2017 Right Carotid: Velocities in the right ICA are consistent with a 1-39% stenosis.  Left Carotid: Velocities in the left ICA are consistent with a 1-39% stenosis.  Vertebrals: Both vertebral arteries were patent with antegrade flow.  Subclavians: Normal flow hemodynamics were seen in bilateral subclavian  arteries.   Transthoracic Echocardiogram  02/28/2017 Study Conclusions - Left ventricle: Systolic function was normal. The estimated ejection fraction was in the range of 55% to 60%. - Mitral valve: Calcified annulus. Moderately thickened, mildly calcified leaflets . - Atrial septum: No defect or patent foramen ovale was identified. - Tricuspid valve: There was moderate regurgitation. - Pulmonary arteries: PA peak pressure: 50 mm Hg (S).  Repeat MRI brain:  03/01/2017  1. Moderately motion degraded examination. 2. Evolving acute RIGHT basal ganglia/thalamus hematoma with similar mass effect, 4 mm RIGHT to LEFT midline shift without ventricular entrapment. 3. Small volume redistributed subarachnoid hemorrhage. Similar layering blood products occipital horns. No hydrocephalus. 4. Severe parenchymal brain volume loss and chronic small vessel ischemic disease. Old LEFT basal ganglia lacunar infarct.    HISTORY OF PRESENT ILLNESS   &   HOSPITAL COURSE AUBERT CHOYCE is an 84 y.o. male past medical history of dementia, hypertension and chronic white matter disease transferred to [facility] after presenting to [facility] emergency room.  He was  brought by family for worsening confusion and reduced food intake since last 1-2 days. Head CT was performed which showed acute right basal ganglia/thalamus hematoma with intraventricular extension. His blood pressure has remained below 140 systolic throughout his ER stay and on arrival.   Repeat CT head was stable and CTA head did not show any aneurysm/AVM. Does have history of dementia significant white matter disease. Is not on any blood thinners at home.  Date last known well: 2.13.18 tPA Given: hemorrhage  Intracerebral Hemorrhage (ICH) Score  Glascow Coma Score  13-15 0  Age >/= 80 yes +1  ICH volume >/= 30ml no 0  IVH yes +1  Infratentorial origin yes no 0 Total: 2  ASSESSMENT/PLAN Mr. [facility] ST[facility]LE[facility] is a 84 y.o. male with history of dementia, HTN, remote lacunar infarcts on MRI, and BPH admitted for worsening confusion, decreased p.o. intake for 1-2 days. No tPA given due to ICH.    ICH:  right BG/thalamus ICH with IVH likely secondary to small vessel disease source  Resultant left facial droop  MRI - Rt BG hematoma - 4 mm Rt to Lt shift - small SAH - old LEFT basal ganglia lacunar infarct. Repeat evolving acute RIGHT basal ganglia/thalamus hematoma with similar mass effect  CTA head -no aneurysm or AVM, diffuse atherosclerosis including left M2, bilateral ACAs and bilateral distal PCAs  Carotid Doppler - unremarkable  2D Echo - EF 55-60%. No cardiac source of emboli identified.  LDL 120  HgbA1c 6.4  SCDs for VTE prophylaxis  Failed swallow, placing ngtube with feeds in the morning  Diet NPO time specified  Fall precautions   No antithrombotic prior to admission, now on No antithrombotic due to ICH  Ongoing aggressive stroke risk factor management  Therapy recommendations: CIR  Disposition: CIR   Dementia   History of mild dementia as per daughter  Able to perform most ADLs at  home except finance  Deficit on short-term memory  No behavior disturbance  Hypertension  Stable, No episodes of hypotension overnight  BP goal < 160  Cardizem home dose decreased, continue to monitor need to resume full dose  PRN Labetalol  Hyperlipidemia  Home meds:  none   LDL 120, goal < 70  Lipitor 20 mg added  Continue statin at discharge  Dysphagia  Moderate dysarthria  Failed bedside swallow screening  SLP following, Cortrak in place 03/03/2017 and TF's in progress  Other Stroke  Risk Factors  Advanced age  Hx stroke/TIA on imaging  Other Active Problems  Hypokalemia - KPhos in progress, K+3.3 today  Hypophosphorous - K Phos in progress, Phos 2.1 today  Anemia of chronic disease - Stable, CIR will continue to monitor  Left lower lobe PNA/Asp PNA by chest x-ray - WBCs 13.8 - Temp 99.1 today  Repeat CXR -> unchanged 03/02/2017. IV Zosyn x 7 days in progress  Blood cultures no growth 4 days, U/A Negative on 02/28/2017  Palliative care consult on chart. Family not interested in P.Care at this time.  DISCHARGE EXAM Blood pressure (!) 144/71, pulse 97, temperature 99.1 F (37.3 C), temperature source Axillary, resp. rate 18, height 5\' 8"  (1.727 m), weight 52.9 kg (116 lb 10 oz), SpO2 95 %. General - Well nourished, well developed, in no apparent distress.  Ophthalmologic - fundi not visualized due to noncooperation.  Cardiovascular - Regular rate and rhythm.  Neuro - sleepy but easily to arouse. Oriented to self, age and people, but not to time or place.  Following simple commands, paucity of speech, dysarthria.  Naming 1/2, able to repeat simple sentences.  PERRL, EOMI, blinking to visual threat bilaterally, left facial droop, moderate dysarthria, tongue midline.  Moving all extremities. at least 4/5 but Left side appears weaker than Right.  DTR 1+, no Babinski.  Sensation symmetrical, coordination slow but intact.  Gait deferred.  Discharge  Diet  Diet NPO time specified Fall precautions liquids  DISCHARGE PLAN  Disposition:  Transfer to [facility] for ongoing PT, OT and ST  No antithrombotic and due to ICH for secondary stroke prevention.  Recommend ongoing risk factor control by Primary Care Physician at time of discharge from inpatient rehabilitation.  Follow-up Dorothyann Peng, MD in 2 weeks following discharge from rehab.  Follow-up with Dr. [facility] in 6 weeks, office to schedule an appointment.   Greater than 35 minutes were spent preparing discharge.  Brita Romp Stroke Neurology Team 03/04/2017 10:48 AM  I have personally examined this patient, reviewed notes, independently viewed imaging studies, participated in medical decision making and plan of care.ROS completed by me personally and pertinent positives fully documented  I have made any additions or clarifications directly to the above note. Agree with note above.  Delia Heady, MD Medical Director [facility] Pager: [phone] 03/04/2017 3:54 PM

## 2017-03-04 NOTE — Progress Notes (Signed)
Patient arrived to unit. Difficult to arouse but opened eyes, denied pain and answered question about name and birthdate. Patient resting in bed with call light in place.

## 2017-03-04 NOTE — Progress Notes (Signed)
Standley Brooking, RN  Rehab Admission Coordinator  Physical Medicine and Rehabilitation  PMR Pre-admission  Signed  Date of Service:  03/03/2017 4:59 PM       Related encounter: ED to Hosp-Admission (Current) from 02/27/2017 in [facility]      Signed           [] Hide copied text  [] Hover for details   PMR Admission Coordinator Pre-Admission Assessment  Patient: Cesar Maldonado is an 84 y.o., male MRN: [id number] DOB: Jun 16, 1924 Height: 5\' 8"  (172.7 cm) Weight: 52.9 kg (116 lb 10 oz)                                                                                                                                                  Insurance Information HMO: yes    PPO:      PCP:      IPA:      80/20:      OTHER: medicare advantage plan PRIMARY: [employer] Medicare      Policy#: [id number]      Subscriber: pt CM Name: Gweneth Dimitri      Phone#: [phone]     Fax#: [phone] Pre-Cert#: [id number] approved for 7 days with f/u Kathlen Brunswick phone [phone] fax [phone]      Employer: retired Benefits:  Phone #: [phone]     Name: 03/03/17 Eff. Date: 01/14/2017     Deduct: none      Out of Pocket Max: [phone]      Life Max: none CIR: $430 co pay per day days 1 until 4      SNF: no co pay days 1 to 20: $160 co pay per day days 21 to 62; no co pay days 63 to 100 Outpatient: $40 co pay per visit     Co-Pay: visits per medical neccesity Home Health: 100%      Co-Pay: visits per medical neccesity DME: 80%     Co-Pay: 20% Providers: in network  SECONDARY: none       Medicaid Application Date:       Case Manager:  Disability Application Date:       Case Worker:   Emergency Actuary Information    Name Relation Home Work Vass I Spouse [phone]     Mccune,velma Daughter   [phone]     Current Medical History  Patient Admitting Diagnosis: right basal ganglia/thalamic  hemorrhage  History of Present Illness:  GUR:KYHCWC E Nobleis a 84 y.o.RH male with history ofdementia,HTN, BPH who was admitted on 02/27/17 with progressive issues with po intake, lethargy, abdominal pain and weakness. Was seen in ED 2/10 and treated for dehydration--CT head negative. Reports of fall 2 days PTA. RepeatCT the headdoneshowed acute right basal ganglia/thalamus hematoma. CTA of  headwas negative for AVM and showed evolving right basal ganglia/thalamus hematoma with IV extension, stable right to left mass effect,stable severe atrophy and multifocal severe stenosis in anterior and posterior circulation due to atherosclerosis. MRI brain showed evolving right basal ganglia/thalamus hematoma, stable mass effect and small volume SAH layering in occipital horns. Carotid dopplers without significant ICA stenosis. 2D echo with EF 55-60% with moderately thickened MV and moderate TVR.   CXR showed LLL pneumonia and he was started on Cefepime for treatment. Swallow evaluation done and patient and limited due to lethargy as well as minimal efforts at swallow--kept NPO with cortrak for nutritional support and family would like to monitor progress prior to decision regarding PEG placement. Patient with improvement in mentation, oriented to self only and able to follow simple with increased time.   Total: 7 NIHSS  Past Medical History      Past Medical History:  Diagnosis Date  . BPH (benign prostatic hyperplasia)   . 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 Dementia   . Hypertension   . Left ventricular diastolic dysfunction, NYHA class 1   . Mental disorder    alzhiemers  . Syncope    Cardionet 04/14/12    Family History  family history includes Cancer in his sister.  Prior Rehab/Hospitalizations:  Has the patient had major surgery during 100 days prior to admission? No  Current  Medications   Current Facility-Administered Medications:  .  0.9 %  sodium chloride infusion, , Intravenous, Continuous, Costello, Mary A, NP, Last Rate: 75 mL/hr at 03/04/17 0838 .  acetaminophen (TYLENOL) tablet 650 mg, 650 mg, Oral, Q4H PRN **OR** acetaminophen (TYLENOL) solution 650 mg, 650 mg, Per Tube, Q4H PRN **OR** acetaminophen (TYLENOL) suppository 650 mg, 650 mg, Rectal, Q4H PRN, Aroor, Georgiana Spinner R, MD .  atorvastatin (LIPITOR) tablet 20 mg, 20 mg, Oral, q1800, Costello, Mary A, NP, 20 mg at 03/03/17 1715 .  chlorhexidine (PERIDEX) 0.12 % solution 15 mL, 15 mL, Mouth Rinse, BID, Marvel Plan, MD, 15 mL at 03/04/17 1005 .  diltiazem (CARDIZEM CD) 24 hr capsule 180 mg, 180 mg, Oral, Daily, Costello, Mary A, NP .  dorzolamide-timolol (COSOPT) 22.3-6.8 MG/ML ophthalmic solution 1 drop, 1 drop, Both Eyes, BID, Aroor, Dara Lords, MD, 1 drop at 03/04/17 1005 .  feeding supplement (JEVITY 1.2 CAL) liquid 1,000 mL, 1,000 mL, Per Tube, Continuous, Micki Riley, MD, Last Rate: 50 mL/hr at 03/04/17 0649, 1,000 mL at 03/04/17 0649 .  free water 30 mL, 30 mL, Per Tube, Q6H, Micki Riley, MD, 30 mL at 03/04/17 1221 .  labetalol (NORMODYNE,TRANDATE) injection 10 mg, 10 mg, Intravenous, Q2H PRN, Aroor, Georgiana Spinner R, MD .  latanoprost (XALATAN) 0.005 % ophthalmic solution 1 drop, 1 drop, Both Eyes, QHS, Aroor, Dara Lords, MD, 1 drop at 03/03/17 2132 .  MEDLINE mouth rinse, 15 mL, Mouth Rinse, q12n4p, Marvel Plan, MD, 15 mL at 03/04/17 1221 .  pantoprazole (PROTONIX) injection 40 mg, 40 mg, Intravenous, QHS, Aroor, Dara Lords, MD, 40 mg at 03/03/17 2131 .  phosphorus (K PHOS NEUTRAL) tablet 500 mg, 500 mg, Oral, TID AC & HS, Micki Riley, MD, 500 mg at 03/04/17 1005 .  piperacillin-tazobactam (ZOSYN) IVPB 3.375 g, 3.375 g, Intravenous, Q8H, Rinehuls, David L, PA-C, Stopped at 03/04/17 1006 .  senna-docusate (Senokot-S) tablet 1 tablet, 1 tablet, Oral, BID, Aroor, Dara Lords, MD, 1 tablet at 03/04/17  1005  Facility-Administered Medications  Ordered in Other Encounters:  .  0.3 mL EPINEPHrine 1 mg/mL(1:1000) in BSS (500 mL) irrigation, , , PRN, Shade FloodGeiger, Greer, MD .  balanced salts (BSS) ophthalmic solution, , , PRN, Shade FloodGeiger, Greer, MD, 500 mL at 12/19/10 1045 .  ceFAZolin (ANCEF) 200 mg/mL injection, , , PRN, Shade FloodGeiger, Greer, MD, 1 mL at 12/19/10 0940  Patients Current Diet: Diet NPO time specified Fall precautions.   Precautions / Restrictions Precautions Precautions: Fall Restrictions Weight Bearing Restrictions: No   Has the patient had 2 or more falls or a fall with injury in the past year?No Fell 2 days pta, seen at Urgent care and discharged home Prior Activity Level Limited Community (1-2x/wk): Independnet without AD pta; has not driven in 10 years due to glaucoma  Home Assistive Devices / Equipment Home Assistive Devices/Equipment: None Home Equipment: Shower seat, Grab bars - tub/shower  Prior Device Use: Indicate devices/aids used by the patient prior to current illness, exacerbation or injury? None of the above  Prior Functional Level Prior Function Level of Independence: Independent Comments: lives with wife, daughter helps with bills and checks on parents  Self Care: Did the patient need help bathing, dressing, using the toilet or eating?  Independent  Indoor Mobility: Did the patient need assistance with walking from room to room (with or without device)? Independent  Stairs: Did the patient need assistance with internal or external stairs (with or without device)? Independent  Functional Cognition: Did the patient need help planning regular tasks such as shopping or remembering to take medications? Needed some help  Current Functional Level Cognition  Arousal/Alertness: Awake/alert Overall Cognitive Status: Impaired/Different from baseline Current Attention Level: Sustained Orientation Level: Oriented to person, Disoriented to place, Disoriented  to time, Disoriented to situation Following Commands: Follows one step commands inconsistently, Follows one step commands with increased time Safety/Judgement: Decreased awareness of safety, Decreased awareness of deficits General Comments: pt not talking throughout treatment session Attention: Focused, Sustained Focused Attention: Appears intact Sustained Attention: Impaired Sustained Attention Impairment: Functional basic Memory: Impaired Memory Impairment: Storage deficit, Decreased recall of new information, Decreased short term memory, Decreased long term memory Decreased Long Term Memory: Functional basic(Unable to tell SLP how many children he has) Decreased Short Term Memory: Functional basic    Extremity Assessment (includes Sensation/Coordination)  Upper Extremity Assessment: RUE deficits/detail, LUE deficits/detail RUE Deficits / Details: genrealized weakness LUE Coordination: decreased fine motor, decreased gross motor  Lower Extremity Assessment: Generalized weakness, LLE deficits/detail LLE Deficits / Details: grossly 3/5, functionally, difficulty controlling and advancing with gait    ADLs  Overall ADL's : Needs assistance/impaired Eating/Feeding: Total assistance Grooming: Moderate assistance, Wash/dry face Grooming Details (indicate cue type and reason): supported sitting Upper Body Bathing: Maximal assistance Upper Body Bathing Details (indicate cue type and reason): supported sitting Lower Body Bathing: Total assistance Lower Body Bathing Details (indicate cue type and reason): Mod A +2 sit<>stand Upper Body Dressing : Total assistance Upper Body Dressing Details (indicate cue type and reason): supported sitting Lower Body Dressing: Total assistance Lower Body Dressing Details (indicate cue type and reason): Mod A +2 sit<>stand Toilet Transfer: Maximal assistance, +2 for safety/equipment, Stand-pivot, BSC Toileting- Clothing Manipulation and Hygiene: Total  assistance Toileting - Clothing Manipulation Details (indicate cue type and reason): Mod A +2 sit<>stand    Mobility  Overal bed mobility: Needs Assistance Bed Mobility: Supine to Sit Supine to sit: Max assist, +2 for physical assistance, HOB elevated General bed mobility comments: cues to initiate movement with UEs, assist to  move legs off EOB, to elevate trunk, and to scoot pelvis to EOB    Transfers  Overall transfer level: Needs assistance Transfers: Sit to/from Stand, Stand Pivot Transfers Sit to Stand: Mod assist, +2 physical assistance Stand pivot transfers: Max assist, +2 physical assistance General transfer comment: mod assist +2 to stand from bed with assist for power up, cues for anterior translation, upright posture and hip extension. Max assist stand step transfer +2 with assist and multimodal cues for weight shift, balance, hand placement. slow intiation noted throughout    Ambulation / Gait / Stairs / Psychologist, prison and probation services  Ambulation/Gait Ambulation/Gait assistance: Max assist, +2 physical assistance Ambulation Distance (Feet): 1 Feet Assistive device: 2 person hand held assist Gait Pattern/deviations: Narrow base of support, Trunk flexed, Leaning posteriorly, Staggering right, Staggering left General Gait Details: stand step to chair with decreased BOS requiring max assist +2 for weight shift and balance/trunk control. took ~4 small steps before sitting in recliner chair Gait velocity interpretation: Below normal speed for age/gender    Posture / Balance Dynamic Sitting Balance Sitting balance - Comments: left lateral lean with posterior buas Balance Overall balance assessment: Needs assistance Sitting-balance support: Bilateral upper extremity supported, Feet supported Sitting balance-Leahy Scale: Poor Sitting balance - Comments: left lateral lean with posterior buas Postural control: Posterior lean, Left lateral lean Standing balance support: Bilateral upper  extremity supported Standing balance-Leahy Scale: Zero Standing balance comment: physical assist with bil UE support required for balance, noted left lateral lean with posterior bias    Special needs/care consideration BiPAP/CPAP  N/a CPM  N/a Continuous Drip IV  IVF at 75 cc/hr Dialysis  N/a Life Vest  N/a Oxygen  N/a Special Bed  N/a Trach Size  N/a Wound Vac n/a Skin skin dry and flaky                            Bowel mgmt: no documented LBM since admit Bladder mgmt: incontinent; external catheter Diabetic mgmt n/a Palliative team consulted for goals of care on 03/01/2017.Pt is DNR, but family wary of those discussions per Palliative team   Previous Home Environment Living Arrangements: Spouse/significant other  Lives With: Spouse Available Help at Discharge: Family, Available 24 hours/day(wife, local son and daughter can assist per daughter, Velma) Type of Home: House Home Layout: One level Home Access: Stairs to enter Entrance Stairs-Rails: Right Entrance Stairs-Number of Steps: 3 at front with rails both sides and 3 steps in back without rails Bathroom Shower/Tub: Engineer, manufacturing systems: Standard Bathroom Accessibility: Yes How Accessible: Accessible via walker Home Care Services: No Additional Comments: home lay out clarified with family  Discharge Living Setting Plans for Discharge Living Setting: Lives with (comment), Patient's home Type of Home at Discharge: House Discharge Home Layout: One level Discharge Home Access: Stairs to enter Entrance Stairs-Rails: Right, Left Entrance Stairs-Number of Steps: 3 steps in front with rails both sides and 3 steps in back with no rails Discharge Bathroom Shower/Tub: Tub/shower unit Discharge Bathroom Toilet: Standard Discharge Bathroom Accessibility: Yes How Accessible: Accessible via walker Does the patient have any problems obtaining your medications?: No  Social/Family/Support Systems Patient Roles:  Spouse, Parent(2 local children and 2 children out of town) Solicitor Information: Landscape architect, daughter and pt's wife, [name] Anticipated Caregiver: wife and children Anticipated Industrial/product designer Information: see above Caregiver Availability: 24/7 Discharge Plan Discussed with Primary Caregiver: Yes Is Caregiver In Agreement with Plan?: Yes Does Caregiver/Family have Issues with Lodging/Transportation while  Pt is in Rehab?: No  Goals/Additional Needs Patient/Family Goal for Rehab: supervision to min assist with PT, OT, and SLP Expected length of stay: ELOS 17 to 24 days Pt/Family Agrees to Admission and willing to participate: Yes Program Orientation Provided & Reviewed with Pt/Caregiver Including Roles  & Responsibilities: Yes  Decrease burden of Care through IP rehab admission: n/a  Possible need for SNF placement upon discharge: not anticipated  Patient Condition: This patient's medical and functional status has changed since the consult dated: 02/28/2017 in which the Rehabilitation Physician determined and documented that the patient's condition is appropriate for intensive rehabilitative care in an inpatient rehabilitation facility. See "History of Present Illness" (above) for medical update. Functional changes are: max assist. Patient's medical and functional status update has been discussed with the Rehabilitation physician and patient remains appropriate for inpatient rehabilitation. Will admit to inpatient rehab today.  Preadmission Screen Completed By:  Clois Dupes, 03/04/2017 12:44 PM ______________________________________________________________________   Discussed status with Dr. Riley Kill on 03/04/2017 at 1244 and received telephone approval for admission today.  Admission Coordinator:  Clois Dupes, time 1610 Date 03/04/2017             Cosigned by: Ranelle Oyster, MD at 03/04/2017 1:18 PM  Revision History

## 2017-03-04 NOTE — Progress Notes (Signed)
Physical Therapy Treatment Patient Details Name: Cesar Maldonado MRN: [id number] DOB: March 09, 1924 Today's Date: 03/04/2017    History of Present Illness 84 y.o. male PMHx: dementia, HTN and chronic white matter disease.  Admitted for worsening confusion and reduced food intake  CT showed acute right basal ganglia/thalamus hematoma with intraventricular extension    PT Comments    Patient not progressing during today's session most likely secondary to decreased arousal/alertness. Pt keeps eyes closed for most of session. No verbalizations noted. Only follows a few 1 step commands with repetition inconsistently with increased time- not sure if this is behavorial as pt wanting to sleep vs cognition? Pt with no balance reactions in any direction with dynamic sitting balance today. Difficult to sustain attention. Pt plans to d/c to CIR later today. Will follow.    Follow Up Recommendations  CIR;Supervision/Assistance - 24 hour     Equipment Recommendations  3in1 (PT);Wheelchair (measurements PT);Wheelchair cushion (measurements PT)    Recommendations for Other Services       Precautions / Restrictions Precautions Precautions: Fall Precaution Comments: cortrak Restrictions Weight Bearing Restrictions: No    Mobility  Bed Mobility Overal bed mobility: Needs Assistance Bed Mobility: Supine to Sit;Sit to Supine     Supine to sit: Total assist;+2 for physical assistance Sit to supine: Total assist;+2 for physical assistance   General bed mobility comments: No initiation noted during transfer to get to EOB or return to supine.   Transfers                 General transfer comment: not attempted today as pt with poor arousal and not following many commands.   Ambulation/Gait                 Stairs            Wheelchair Mobility    Modified Rankin (Stroke Patients Only) Modified Rankin (Stroke Patients Only) Pre-Morbid Rankin Score: Slight  disability Modified Rankin: Severe disability     Balance Overall balance assessment: Needs assistance Sitting-balance support: Feet supported;No upper extremity supported Sitting balance-Leahy Scale: Zero Sitting balance - Comments: Requires constant support to maintain balance sitting EOB- total A with pt pushing posteriorly at times. Practiced anterior trunk lean and rocking motion to elicit arousal and attempt to activate core. No balance reactions noted today in any direction.  Postural control: Posterior lean                                  Cognition Arousal/Alertness: Lethargic Behavior During Therapy: Flat affect Overall Cognitive Status: Difficult to assess Area of Impairment: Attention;Following commands                   Current Attention Level: Focused   Following Commands: Follows one step commands inconsistently       General Comments: No verbalizations during session. Nods yes to name being Cesar Maldonado. Follows a few commands today to squeeze hand, open eyes but not consistently otherwise not following verbal commands- not sure if this is behavorial vs cognition as pt very sleepy. Opens eyes x2 otherwise eyes are closed.       Exercises      General Comments        Pertinent Vitals/Pain Pain Assessment: Faces Faces Pain Scale: No hurt    Home Living  Prior Function            PT Goals (current goals can now be found in the care plan section) Progress towards PT goals: Not progressing toward goals - comment(secondary to decreased arousal)    Frequency    Min 4X/week      PT Plan Current plan remains appropriate    Co-evaluation              AM-PAC PT "6 Clicks" Daily Activity  Outcome Measure  Difficulty turning over in bed (including adjusting bedclothes, sheets and blankets)?: Unable Difficulty moving from lying on back to sitting on the side of the bed? : Unable Difficulty sitting  down on and standing up from a chair with arms (e.g., wheelchair, bedside commode, etc,.)?: Unable Help needed moving to and from a bed to chair (including a wheelchair)?: A Lot Help needed walking in hospital room?: A Lot Help needed climbing 3-5 steps with a railing? : Total 6 Click Score: 8    End of Session   Activity Tolerance: Patient limited by lethargy Patient left: in bed;with call bell/phone within reach;with bed alarm set;with SCD's reapplied Nurse Communication: Mobility status;Need for lift equipment PT Visit Diagnosis: Other abnormalities of gait and mobility (R26.89);Unsteadiness on feet (R26.81);Muscle weakness (generalized) (M62.81)     Time: 1343-1400 PT Time Calculation (min) (ACUTE ONLY): 17 min  Charges:  $Therapeutic Activity: 8-22 mins                    G Codes:       Mylo RedShauna Donato Studley, PT, DPT [phone]     Blake DivineShauna A Cassiel Fernandez 03/04/2017, 2:07 PM

## 2017-03-04 NOTE — Progress Notes (Addendum)
[facility] reporting she laid Mr. Pavlich flat to give him an enema, Mr. Iran Sizeroble began to have emesis and was suctioned immediately. Also stated an order was given by the PA for a respiratory treatment. Mcmanigal remains on 8 liters of nasal cannula with oxygen desaturation in the 80's. At the time of the call  his oxygen saturation was 86%. I ordered a stat CXR awaiting results. [facility] was asked to obtain a set of vitals, also asked for Skeet SimmerHilary to call rapid response, she stated " she had called rapid response already and was instructed to call the on call staff. This provider placed a call to the [facility], I spoke with Dr. Clyde LundborgNiu, informed him of the above we discussed Mr. Frizell most likely had an Aspiration Event,  He ( Dr. Clyde LundborgNiu) will come to evaluate Mr. Biscoe. I called [facility] RN to inform her of the above, she verbalizes understanding.

## 2017-03-04 NOTE — Care Management Note (Signed)
Case Management Note  Patient Details  Name: Cesar Maldonado MRN: [id number] Date of Birth: [birth year]  Subjective/Objective:                    Action/Plan: Pt discharging to CIR today. No further needs per CM.   Expected Discharge Date:  03/04/17               Expected Discharge Plan:  IP Rehab Facility  In-House Referral:  Clinical Social Work  Discharge planning Services  CM Consult  Post Acute Care Choice:    Choice offered to:     DME Arranged:    DME Agency:     HH Arranged:    HH Agency:     Status of Service:  Completed, signed off  If discussed at [employer] of [employer] Meetings, dates discussed:    Additional Comments:  Cesar BaloKelli F Makyah Lavigne, RN 03/04/2017, 12:56 PM

## 2017-03-04 NOTE — Significant Event (Signed)
Rapid Response Event Note  Overview: Respiratory, Cardiac Called by dayshift RN about patient's oxygen saturations being low and HR in the 140s. Per RN, earlier today around 1700, patient was laid flat and he vomited.  Since then he has required increasing amounts of oxygen. Currently he is on 8L NRB. RN informed me that Rehab NP on call has called [facility] for a consult.   Initial Focused Assessment: Upon arrival, patient's sats were in the low 80s on 15LNRB, patient's RR was in the 40-50, + use of accessory muscles, + increased WOB, retractions, nasal flaring, lung sounds rhonchi throughout all lung fields, patient is aspirating, he is not able to protect his airway and he cannot cough of his secretions. Skin is warm and dry, + 1 pulses, HR in the 140-150s, SBP 160-170s, MAPs 90-100, temp 101.8 Rectal, + shivering. Patient is in acute respiratory distress.   Interventions: -- Patient placed on continuous telemetry monitoring. -- STAT CXR -- STAT ABG -- NTS x 3, oxygen increased to 15L NRB -- [facility] MD at came to bedside and PCCM was consulted.   Plan of Care (if not transferred): -- PCCM did speak family, code status changed to DNR, [facility] to readmit to hospital. Will be admitted to 6E- SDU status  Event Summary:   at    Call Time 1921 Arrival Time 1924 End Time 2200   Muriel Wilber R

## 2017-03-04 NOTE — Progress Notes (Signed)
Pt taken up to CIR via bed by RN and NT.

## 2017-03-04 NOTE — Progress Notes (Signed)
  Speech Language Pathology Treatment: Dysphagia  Patient Details Name: Cesar Maldonado MRN: [id number] DOB: 1924-07-10 Today's Date: 03/04/2017 Time: [id number] SLP Time Calculation (min) (ACUTE ONLY): 21 min  Assessment / Plan / Recommendation Clinical Impression  Pt seen for dysphagia therapy, no family present. Pt awoke easily, did not respond verbally, but followed 75% of verbal cues and  accepted teaspoons of PO without resistance. With small 1/2 teaspoons of nectar, prolonged lingual pumping occurred with absent/severely delayed swallow. Subsequent bites with a full teaspoon resulted in decreased oral transit time and shorter delays in swallow trigger. Expect that awareness improved with practice and sensory feedback with larger bolus was more effective in triggering swallow. No wet vocal quality with nectar in isolation, but after two bites of puree were given, pts seemed to sense residual and responded to verbal cue to clear throat with some expectoration.  Overall, pt continue to demonstrates signs of dysphagia related to new CVA, baseline dementia and possible chronic oropharyngeal deficits. He does however, show improvement for ability to participate in and progress with therapy. An objective test is needed at this point to give family more information about severity. Recommend pt remain NPO for now and f/u with SLP on CIR for objective testing when he is ready from their perspective. Discussed result with RN and CIR admissions coordinator.    HPI HPI: Pt is a 84 y.o.malepast medical history of dementia, hypertension and chronic white matter disease [facility].He was brought by family for worsening confusion and reduced food intake since last 1-2 days. Head CT was performed which showed acuterightbasal ganglia/thalamus hematoma with intraventricular extension. Chest x ray with left lower lobe pneumonia and bilateral  pleural effusions.      SLP Plan  Continue with current plan of care       Recommendations  Diet recommendations: NPO Medication Administration: Via alternative means(RN has uncrushable med, can try whole in puree)                General recommendations: Rehab consult Follow up Recommendations: Inpatient Rehab Plan: Continue with current plan of care       GO                Isiaha Greenup, Riley NearingBonnie Caroline 03/04/2017, 11:27 AM

## 2017-03-04 NOTE — Progress Notes (Signed)
Patient's family arrived and confirmed patient had not had a bowel movement since prior to admission. Family stepped outside so enema could be given. Tube feed was disconnected during enema. Upon sitting back up after enema, patient began coughing/vomiting. Emesis immediately suctioned out of mouth with yankeur. Family returned to room. PA notified and came to assess patient. Coughing gradually subsided. Tube feed not restarted. Respiratory called to administer breathing treatment. PA ordered patient be placed on continuous pulse oximeter. When oximeter placed, O2 sats noted to be varying between 84 and 98. Heart rates ranging from 100-122. Respiratory therapist arrived to administer breathing treatment. Patient placed on 8L by [place] per RT. O2 Sats continued to vary between 80s and 90s. Rapid response called. Verne CarrowEuince Thomas, NP contacted and orders given for stat chest x ray. Rapid response arrived. NP contacted hospitalist who arrived to assess and manage patient status.

## 2017-03-04 NOTE — Progress Notes (Signed)
[facility]. I promptly arrived at his bedside where his is in moderate respiratory distress with RR in 30's and Rhonchi on auscultation. He is not arousable to voice or touch. Pox 100% on NRB. BP stable; SVT in 150's. RN reports patient has been unresponsive ever since his rec  ent CVA and that earlier tonight he vomited and aspirated the emesis, leading to his current condition. He had also been DNR but that was changed to full code earlier tonight. I am not clear why he had initially been DNR and if so why the family then decided to change this code status. I called patient's wife at the number listed in his chart. His daughter answered and put me on speaker phone with both the daughter and wife listening. I explained the current condition and that his respiratory distress was concerning that he may decompensate further and require intubation. At his advanced age and given his frailty and poor baseline mental status, if intubated, he will likely need trach with long-term ventilation and PEG. The family (wife, daughter, patient's sister, and patient's brother) all said that this is NOT what the patient would want and that he had a durable DNR already stating that he would not want those things. Explained to them the alternative would be to continue IV antibiotics and NRB mask and consider chest PT if needed to mobilize secretions and hope that he is able to pull through this, meanwhile trying to keep him comfortable and respect his wishes throughout this. His family was unanimous in wanting to pursue this option. I will make patient DNR (no intubation, no CPR) to reflect the family's wishes. It did not sound as if they want to pursue comfort care at this time although I did not explicitly describe that option. Will plan to transfer patient to hospitalist service step-down unit for further medical management.   60 minutes critical care time  Milana ObeyKathleen Joziyah Roblero,  MD Pulmonary & Critical Care Medicine Pager: [phone]

## 2017-03-04 NOTE — Progress Notes (Signed)
Pt placed on mask at 10 L/min O2 pt has  tachycardia and tachypnea pulse ox is 100% . Family spoke with MD and pt has been made DNR . Pt has condom cath with cloudy yellow urine. Pt was incontinent of loose brown stool. Lungs remain with rales in all fields. Awaiting transfer to acute care.

## 2017-03-04 NOTE — H&P (Signed)
Physical Medicine and Rehabilitation Admission H&P       Chief Complaint  Patient presents with  . Stroke with functional deficits.     HPI:  Cesar Dibiasio Nobleis a 84 y.o.RH male with history of dementia, HTN, BPH who was admitted on 02/27/17 with progressive issues with po intake, lethargy, abdominal pain and weakness. Was seen in ED 2/10 and treated for dehydration--CT head negative. Reports of fall 2 days PTA. Repeat CT the head done showed acute right basal ganglia/thalamus hematoma.   CTA of head was negative for AVM and showed evolving right basal ganglia/thalamus hematoma with IV extension, stable right to left mass effect,stable severe atrophy and multifocal severe stenosis in anterior and posterior circulation due to atherosclerosis.  MRI brain showed evolving right basal ganglia/thalamus hematoma, stable mass effect and small volume SAH layering in occipital horns.  Carotid dopplers without significant ICA stenosis. 2D echo with EF 55-60% with moderately thickened MV and moderate TVR.   CXR showed LLL pneumonia and was monitored till he developed fever 2/17. Repeat CXR without improvement therefore Zosyn added for treatment.  Swallow evaluation done and patient and limited due to lethargy as well as minimal efforts at swallow--kept NPO with cortak for nutritional support and family would like to monitor progress prior to decision regarding PEG placement.  Patient with improvement in mentation, oriented to self only and able to follow simple with increased time. CIR recommended due to functional deficits.    Review of Systems  Unable to perform ROS: Mental acuity  Musculoskeletal: Positive for myalgias and neck pain.          Past Medical History:  Diagnosis Date  . BPH (benign prostatic hyperplasia)   . 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 Dementia   . Hypertension   . Left  ventricular diastolic dysfunction, NYHA class 1   . Mental disorder    alzhiemers  . Syncope    Cardionet 04/14/12         Past Surgical History:  Procedure Laterality Date  . BREAST SURGERY     left tumor  . PARS PLANA VITRECTOMY  12/19/2010   Procedure: PARS PLANA VITRECTOMY WITH INTRAOCULAR LENS;  Surgeon: Adonis Brook, MD;  Location: [place];  Service: Ophthalmology;  Laterality: Right;  PARS PLANA VITRECTOMY WITH INTRAOCULAR LENS [900]membrane peel,endolaser right eye         Family History  Problem Relation Age of Onset  . Cancer Sister     Social History:  Married. Lives with family and independent PTA. Per reports that he quit smoking about 29 years ago. he has never used smokeless tobacco. He reports that he does not drink alcohol or use drugs.    Allergies: No Known Allergies          Medications Prior to Admission  Medication Sig Dispense Refill  . acidophilus (RISAQUAD) CAPS capsule Take 1 capsule by mouth every other day.    . cholecalciferol (VITAMIN D) 1000 units tablet Take 1,000 Units by mouth every other day.    . diltiazem (CARTIA XT) 120 MG 24 hr capsule Take 120 mg by mouth daily.    . dorzolamide-timolol (COSOPT) 22.3-6.8 MG/ML ophthalmic solution Place 1 drop into both eyes 2 (two) times daily.     Marland Kitchen lactose free nutrition (BOOST) LIQD Take 237 mLs by mouth every other day.     . latanoprost (XALATAN) 0.005 % ophthalmic solution  Place 1 drop into both eyes at bedtime.    . Multiple Vitamins-Minerals (CENTRUM PO) Take 1 tablet by mouth every other day.       Drug Regimen Review  Drug regimen was reviewed and remains appropriate with no significant issues identified  Home: Home Living Family/patient expects to be discharged to:: Skilled nursing facility Living Arrangements: Spouse/significant other Available Help at Discharge: Family, Available 24 hours/day(wife, local son and daughter can assist per daughter,  Velma) Type of Home: House Home Access: Stairs to enter Technical brewer of Steps: 2 Entrance Stairs-Rails: Right Home Layout: One level Bathroom Shower/Tub: Chiropodist: Standard Bathroom Accessibility: Yes Home Equipment: Civil engineer, contracting, Grab bars - tub/shower Additional Comments: home lay out clarified with family  Lives With: Spouse   Functional History: Prior Function Level of Independence: Independent Comments: lives with wife, daughter helps with bills and checks on parents  Functional Status:  Mobility: Bed Mobility Overal bed mobility: Needs Assistance Bed Mobility: Supine to Sit Supine to sit: Max assist, +2 for physical assistance, HOB elevated General bed mobility comments: cues to initiate movement with UEs, assist to move legs off EOB, to elevate trunk, and to scoot pelvis to EOB Transfers Overall transfer level: Needs assistance Transfers: Sit to/from Stand, Stand Pivot Transfers Sit to Stand: Mod assist, +2 physical assistance Stand pivot transfers: Max assist, +2 physical assistance General transfer comment: mod assist +2 to stand from bed with assist for power up, cues for anterior translation, upright posture and hip extension. Max assist stand step transfer +2 with assist and multimodal cues for weight shift, balance, hand placement. slow intiation noted throughout Ambulation/Gait Ambulation/Gait assistance: Max assist, +2 physical assistance Ambulation Distance (Feet): 1 Feet Assistive device: 2 person hand held assist Gait Pattern/deviations: Narrow base of support, Trunk flexed, Leaning posteriorly, Staggering right, Staggering left General Gait Details: stand step to chair with decreased BOS requiring max assist +2 for weight shift and balance/trunk control. took ~4 small steps before sitting in recliner chair Gait velocity interpretation: Below normal speed for age/gender  ADL: ADL Overall ADL's : Needs  assistance/impaired Eating/Feeding: Total assistance Grooming: Moderate assistance, Wash/dry face Grooming Details (indicate cue type and reason): supported sitting Upper Body Bathing: Maximal assistance Upper Body Bathing Details (indicate cue type and reason): supported sitting Lower Body Bathing: Total assistance Lower Body Bathing Details (indicate cue type and reason): Mod A +2 sit<>stand Upper Body Dressing : Total assistance Upper Body Dressing Details (indicate cue type and reason): supported sitting Lower Body Dressing: Total assistance Lower Body Dressing Details (indicate cue type and reason): Mod A +2 sit<>stand Toilet Transfer: Maximal assistance, +2 for safety/equipment, Stand-pivot, BSC Toileting- Clothing Manipulation and Hygiene: Total assistance Toileting - Clothing Manipulation Details (indicate cue type and reason): Mod A +2 sit<>stand  Cognition: Cognition Overall Cognitive Status: Impaired/Different from baseline Arousal/Alertness: Awake/alert Orientation Level: Oriented to person, Disoriented to time, Disoriented to place, Disoriented to situation Attention: Focused, Sustained Focused Attention: Appears intact Sustained Attention: Impaired Sustained Attention Impairment: Functional basic Memory: Impaired Memory Impairment: Storage deficit, Decreased recall of new information, Decreased short term memory, Decreased long term memory Decreased Long Term Memory: Functional basic(Unable to tell SLP how many children he has) Decreased Short Term Memory: Functional basic Cognition Arousal/Alertness: Awake/alert Behavior During Therapy: Flat affect Overall Cognitive Status: Impaired/Different from baseline Area of Impairment: Orientation, Attention, Memory, Following commands, Safety/judgement Orientation Level: Disoriented to, Time, Situation, Place Current Attention Level: Sustained Memory: Decreased short-term memory, Decreased recall of precautions Following  Commands: Follows one step commands inconsistently, Follows one step commands with increased time Safety/Judgement: Decreased awareness of safety, Decreased awareness of deficits General Comments: pt not talking throughout treatment session   Blood pressure 136/74, pulse 79, temperature 98.1 F (36.7 C), temperature source Oral, resp. rate 18, height _0  (1.727 m), weight 52.9 kg (116 lb 10 oz), SpO2 100 %. Physical Exam  Nursing note and vitals reviewed. Constitutional: He appears well-developed. He appears lethargic. He appears cachectic. He is easily aroused. No distress.  HENT:  Head: Normocephalic and atraumatic.  Mouth/Throat: Oropharynx is clear and moist.  Eyes: Conjunctivae are normal. Right eye exhibits exudate. Left eye exhibits exudate.  Pin point pupils.  Neck:  Decreased ROM  Cardiovascular: Normal rate and regular rhythm.  Respiratory: Effort normal. No stridor. No respiratory distress. He has no wheezes.  Musculoskeletal: He exhibits no edema or tenderness.  RLE internally rotated and decreased ROM bilateral knees.   Neurological: He is easily aroused. He appears lethargic. He displays abnormal reflex.  Right inattention. Right facial weakness with moderate to sever dysarthria. Left ptosis. Keeps eyes closed.  Kept falling asleep.  Delayed processing --was able to state name and chose place with choice of two. Age " 84 years old". He was unable to follow simple motor commands for MMT.   Skin: He is not diaphoretic.  Psychiatric: His affect is inappropriate. His speech is slurred. He is slowed. Cognition and memory are impaired.    LabResultsLast48Hours        Results for orders placed or performed during the hospital encounter of 02/27/17 (from the past 48 hour(s))  CBC     Status: Abnormal   Collection Time: 03/02/17  9:43 AM  Result Value Ref Range   WBC 10.6 (H) 4.0 - 10.5 K/uL   RBC 3.51 (L) 4.22 - 5.81 MIL/uL   Hemoglobin 9.2 (L) 13.0 - 17.0 g/dL    HCT 28.3 (L) 39.0 - 52.0 %   MCV 80.6 78.0 - 100.0 fL   MCH 26.2 26.0 - 34.0 pg   MCHC 32.5 30.0 - 36.0 g/dL   RDW 14.0 11.5 - 15.5 %   Platelets 98 (L) 150 - 400 K/uL    Comment: CONSISTENT WITH PREVIOUS RESULT Performed at [facility], [place] [address] [place], [place] [address]   Basic metabolic panel     Status: Abnormal   Collection Time: 03/02/17  9:43 AM  Result Value Ref Range   Sodium 140 135 - 145 mmol/L   Potassium 3.7 3.5 - 5.1 mmol/L    Comment: DELTA CHECK NOTED   Chloride 107 101 - 111 mmol/L   CO2 20 (L) 22 - 32 mmol/L   Glucose, Bld 91 65 - 99 mg/dL   BUN 26 (H) 6 - 20 mg/dL   Creatinine, Ser 1.12 0.61 - 1.24 mg/dL   Calcium 8.9 8.9 - 10.3 mg/dL   GFR calc non Af Amer 55 (L) >60 mL/min   GFR calc Af Amer >60 >60 mL/min    Comment: (NOTE) The eGFR has been calculated using the CKD EPI equation. This calculation has not been validated in all clinical situations. eGFR's persistently <60 mL/min signify possible Chronic Kidney Disease.    Anion gap 13 5 - 15    Comment: Performed at [facility] [address], [place],  [address]  CBC     Status: Abnormal   Collection Time: 03/03/17  2:29 AM  Result Value Ref Range   WBC  11.1 (H) 4.0 - 10.5 K/uL   RBC 3.29 (L) 4.22 - 5.81 MIL/uL   Hemoglobin 8.7 (L) 13.0 - 17.0 g/dL   HCT 26.6 (L) 39.0 - 52.0 %   MCV 80.9 78.0 - 100.0 fL   MCH 26.4 26.0 - 34.0 pg   MCHC 32.7 30.0 - 36.0 g/dL   RDW 14.1 11.5 - 15.5 %   Platelets 87 (L) 150 - 400 K/uL    Comment: CONSISTENT WITH PREVIOUS RESULT Performed at [facility] [address], [place], [place] [address]   Basic metabolic panel     Status: Abnormal   Collection Time: 03/03/17  2:29 AM  Result Value Ref Range   Sodium 143 135 - 145 mmol/L   Potassium 3.6 3.5 - 5.1 mmol/L   Chloride 110 101 - 111 mmol/L   CO2 20 (L) 22 - 32 mmol/L   Glucose, Bld 93 65 - 99 mg/dL   BUN 22 (H) 6 - 20  mg/dL   Creatinine, Ser 1.15 0.61 - 1.24 mg/dL   Calcium 8.8 (L) 8.9 - 10.3 mg/dL   GFR calc non Af Amer 54 (L) >60 mL/min   GFR calc Af Amer >60 >60 mL/min    Comment: (NOTE) The eGFR has been calculated using the CKD EPI equation. This calculation has not been validated in all clinical situations. eGFR's persistently <60 mL/min signify possible Chronic Kidney Disease.    Anion gap 13 5 - 15    Comment: Performed at [facility] [address], [place], [place] [address]  Glucose, capillary     Status: None   Collection Time: 03/03/17  4:33 PM  Result Value Ref Range   Glucose-Capillary 94 65 - 99 mg/dL   Comment 1 Notify RN    Comment 2 Document in Chart       ImagingResults(Last48hours)  Dg Chest Port 1 View  Result Date: 03/02/2017 CLINICAL DATA:  Pneumonia. EXAM: PORTABLE CHEST 1 VIEW COMPARISON:  Chest x-ray dated February 27, 2017. FINDINGS: The heart size and mediastinal contours are within normal limits. Normal pulmonary vascularity. The lungs remain hyperinflated. Relatively unchanged reticulonodular interstitial and patchy alveolar opacities in the left lower lobe. No large pleural effusion. No pneumothorax. No acute osseous abnormality. IMPRESSION: Relatively unchanged left lower lobe infiltrate. Electronically Signed   By: Titus Dubin M.D.   On: 03/02/2017 09:37        Medical Problem List and Plan: 1.  Functional deficits and left hemiparesis secondary to right BG/thalamic infarct             -admit to inpatient rehab 2.  DVT Prophylaxis/Anticoagulation: Mechanical:  Antiembolism stockings, knee (TED hose) Bilateral lower extremities 3. Pain Management: tylenol prn 4. Mood: LCSW to follow for evaluation when appropriate   -behavioral component to lethargy. Continue to monitor 5. Neuropsych: This patient is not capable of making decisions on his own behalf. 6. Skin/Wound Care: routine pressure relief measures.  7.  Fluids/Electrolytes/Nutrition: Continue tube feeds. D/C IVF--> change to water flusehes.  8. HTN: Monitor BP bid--SBP goal <140. Change Cardizem to 60 mg tid so can be administered via tube.  9. New diagnosis DM: Hgb A1C-6.4 10. Dysphagia: NPO--K phos added due to low phosphorous levels.  Keep HOB > 30 degrees to prevent aspiration.   11. Thrombocytopenia: Monitor for signs of bleeding. Has been in 4- 80 range  12. Anemia: Monitor for signs of bleeding --9.6 at admisison--> 8.3 today 13. LLL PNA: on Zosyn D #  3/7   Post Admission Physician Evaluation: 1. Functional deficits secondary  to right BG/thalamic infarct. 2. Patient is admitted to receive collaborative, interdisciplinary care between the physiatrist, rehab nursing staff, and therapy team. 3. Patient's level of medical complexity and substantial therapy needs in context of that medical necessity cannot be provided at a lesser intensity of care such as a SNF. 4. Patient has experienced substantial functional loss from his/her baseline which was documented above under the "Functional History" and "Functional Status" headings.  Judging by the patient's diagnosis, physical exam, and functional history, the patient has potential for functional progress which will result in measurable gains while on inpatient rehab.  These gains will be of substantial and practical use upon discharge  in facilitating mobility and self-care at the household level. 5. Physiatrist will provide 24 hour management of medical needs as well as oversight of the therapy plan/treatment and provide guidance as appropriate regarding the interaction of the two. 6. The Preadmission Screening has been reviewed and patient status is unchanged unless otherwise stated above. 7. 24 hour rehab nursing will assist with bladder management, bowel management, safety, skin/wound care, disease management, medication administration, pain management and patient education  and help  integrate therapy concepts, techniques,education, etc. 8. PT will assess and treat for/with: Lower extremity strength, range of motion, stamina, balance, functional mobility, safety, adaptive techniques and equipment, NMR, visual-spatial awareness.   Goals are: supervision to min assist. 9. OT will assess and treat for/with: ADL's, functional mobility, safety, upper extremity strength, adaptive techniques and equipment, NMR, visual-spatial awareness, family ed.   Goals are: supervision to min assist. Therapy may proceed with showering this patient. 10. SLP will assess and treat for/with: cognition, communication.  Goals are: supervision to min assist. 11. Case Management and Social Worker will assess and treat for psychological issues and discharge planning. 12. Team conference will be held weekly to assess progress toward goals and to determine barriers to discharge. 13. Patient will receive at least 3 hours of therapy per day at least 5 days per week. 14. ELOS: 17-24 days       15. Prognosis:  excellent/good     Meredith Staggers, MD, [facility] 03/04/2017  Bary Leriche, PA-C 03/03/2017

## 2017-03-05 ENCOUNTER — Inpatient Hospital Stay (HOSPITAL_COMMUNITY): Payer: Non-veteran care | Admitting: Speech Pathology

## 2017-03-05 ENCOUNTER — Other Ambulatory Visit: Payer: Self-pay

## 2017-03-05 ENCOUNTER — Inpatient Hospital Stay (HOSPITAL_COMMUNITY): Payer: Non-veteran care | Admitting: Physical Therapy

## 2017-03-05 ENCOUNTER — Inpatient Hospital Stay (HOSPITAL_COMMUNITY): Payer: Non-veteran care | Admitting: Occupational Therapy

## 2017-03-05 ENCOUNTER — Encounter (HOSPITAL_COMMUNITY): Payer: Self-pay

## 2017-03-05 DIAGNOSIS — J690 Pneumonitis due to inhalation of food and vomit: Secondary | ICD-10-CM

## 2017-03-05 DIAGNOSIS — I10 Essential (primary) hypertension: Secondary | ICD-10-CM

## 2017-03-05 DIAGNOSIS — I610 Nontraumatic intracerebral hemorrhage in hemisphere, subcortical: Principal | ICD-10-CM

## 2017-03-05 DIAGNOSIS — J9601 Acute respiratory failure with hypoxia: Secondary | ICD-10-CM

## 2017-03-05 DIAGNOSIS — G309 Alzheimer's disease, unspecified: Secondary | ICD-10-CM

## 2017-03-05 DIAGNOSIS — F0280 Dementia in other diseases classified elsewhere without behavioral disturbance: Secondary | ICD-10-CM

## 2017-03-05 DIAGNOSIS — N183 Chronic kidney disease, stage 3 (moderate): Secondary | ICD-10-CM

## 2017-03-05 DIAGNOSIS — A419 Sepsis, unspecified organism: Secondary | ICD-10-CM

## 2017-03-05 DIAGNOSIS — E43 Unspecified severe protein-calorie malnutrition: Secondary | ICD-10-CM

## 2017-03-05 DIAGNOSIS — I519 Heart disease, unspecified: Secondary | ICD-10-CM

## 2017-03-05 LAB — URINALYSIS, ROUTINE W REFLEX MICROSCOPIC
Bilirubin Urine: NEGATIVE
Glucose, UA: NEGATIVE mg/dL
Hgb urine dipstick: NEGATIVE
Ketones, ur: NEGATIVE mg/dL
NITRITE: NEGATIVE
Protein, ur: 30 mg/dL — AB
SPECIFIC GRAVITY, URINE: 1.03 (ref 1.005–1.030)
WBC, UA: NONE SEEN WBC/hpf (ref 0–5)
pH: 5 (ref 5.0–8.0)

## 2017-03-05 LAB — CBC WITH DIFFERENTIAL/PLATELET
BASOS PCT: 0 %
Band Neutrophils: 0 %
Basophils Absolute: 0 10*3/uL (ref 0.0–0.1)
Blasts: 0 %
EOS PCT: 0 %
Eosinophils Absolute: 0 10*3/uL (ref 0.0–0.7)
HCT: 26 % — ABNORMAL LOW (ref 39.0–52.0)
Hemoglobin: 8.6 g/dL — ABNORMAL LOW (ref 13.0–17.0)
LYMPHS ABS: 1.9 10*3/uL (ref 0.7–4.0)
Lymphocytes Relative: 8 %
MCH: 26.8 pg (ref 26.0–34.0)
MCHC: 33.1 g/dL (ref 30.0–36.0)
MCV: 81 fL (ref 78.0–100.0)
MONO ABS: 0.5 10*3/uL (ref 0.1–1.0)
MONOS PCT: 2 %
MYELOCYTES: 0 %
Metamyelocytes Relative: 0 %
NEUTROS ABS: 21.7 10*3/uL — AB (ref 1.7–7.7)
NEUTROS PCT: 90 %
OTHER: 0 %
PLATELETS: 56 10*3/uL — AB (ref 150–400)
Promyelocytes Absolute: 0 %
RBC: 3.21 MIL/uL — ABNORMAL LOW (ref 4.22–5.81)
RDW: 14.6 % (ref 11.5–15.5)
WBC: 24.1 10*3/uL — ABNORMAL HIGH (ref 4.0–10.5)
nRBC: 0 /100 WBC

## 2017-03-05 LAB — BRAIN NATRIURETIC PEPTIDE: B Natriuretic Peptide: 108.3 pg/mL — ABNORMAL HIGH (ref 0.0–100.0)

## 2017-03-05 LAB — COMPREHENSIVE METABOLIC PANEL
ALT: 14 U/L — AB (ref 17–63)
AST: 64 U/L — ABNORMAL HIGH (ref 15–41)
Albumin: 2 g/dL — ABNORMAL LOW (ref 3.5–5.0)
Alkaline Phosphatase: 82 U/L (ref 38–126)
Anion gap: 14 (ref 5–15)
BUN: 28 mg/dL — ABNORMAL HIGH (ref 6–20)
CHLORIDE: 121 mmol/L — AB (ref 101–111)
CO2: 17 mmol/L — AB (ref 22–32)
CREATININE: 1.3 mg/dL — AB (ref 0.61–1.24)
Calcium: 8.3 mg/dL — ABNORMAL LOW (ref 8.9–10.3)
GFR, EST AFRICAN AMERICAN: 54 mL/min — AB (ref >60)
GFR, EST NON AFRICAN AMERICAN: 46 mL/min — AB (ref >60)
Glucose, Bld: 93 mg/dL (ref 65–99)
POTASSIUM: 4 mmol/L (ref 3.5–5.1)
SODIUM: 152 mmol/L — AB (ref 135–145)
Total Bilirubin: 1.1 mg/dL (ref 0.3–1.2)
Total Protein: 5.4 g/dL — ABNORMAL LOW (ref 6.5–8.1)

## 2017-03-05 LAB — HIV ANTIBODY (ROUTINE TESTING W REFLEX): HIV SCREEN 4TH GENERATION: NONREACTIVE

## 2017-03-05 LAB — LACTIC ACID, PLASMA
LACTIC ACID, VENOUS: 4.1 mmol/L — AB (ref 0.5–1.9)
Lactic Acid, Venous: 2.8 mmol/L (ref 0.5–1.9)
Lactic Acid, Venous: 3.4 mmol/L (ref 0.5–1.9)

## 2017-03-05 LAB — PROCALCITONIN: PROCALCITONIN: 6.97 ng/mL

## 2017-03-05 MED ORDER — SODIUM CHLORIDE 0.9 % IV SOLN
INTRAVENOUS | Status: DC
Start: 2017-03-05 — End: 2017-03-05
  Administered 2017-03-05: 03:00:00 via INTRAVENOUS

## 2017-03-05 MED ORDER — SODIUM CHLORIDE 0.9 % IV BOLUS (SEPSIS)
500.0000 mL | Freq: Once | INTRAVENOUS | Status: AC
Start: 2017-03-05 — End: 2017-03-05
  Administered 2017-03-05: 500 mL via INTRAVENOUS

## 2017-03-05 MED ORDER — CHLORHEXIDINE GLUCONATE 0.12 % MT SOLN
15.0000 mL | Freq: Two times a day (BID) | OROMUCOSAL | Status: DC
  Administered 2017-03-05 – 2017-03-06 (×3): 15 mL via OROMUCOSAL
  Filled 2017-03-05 (×3): qty 15

## 2017-03-05 MED ORDER — HYDRALAZINE HCL 20 MG/ML IJ SOLN: 5.0000 mg | INTRAMUSCULAR | Status: DC | PRN

## 2017-03-05 MED ORDER — DEXTROSE-NACL 5-0.45 % IV SOLN
INTRAVENOUS | Status: DC
  Administered 2017-03-05 – 2017-03-06 (×2): via INTRAVENOUS

## 2017-03-05 MED ORDER — IPRATROPIUM BROMIDE 0.02 % IN SOLN
0.5000 mg | Freq: Four times a day (QID) | RESPIRATORY_TRACT | Status: DC
  Administered 2017-03-05 (×2): 0.5 mg via RESPIRATORY_TRACT
  Filled 2017-03-05 (×4): qty 2.5

## 2017-03-05 MED ORDER — METOPROLOL TARTRATE 5 MG/5ML IV SOLN
5.0000 mg | Freq: Once | INTRAVENOUS | Status: AC
Start: 2017-03-05 — End: 2017-03-05
  Administered 2017-03-05: 5 mg via INTRAVENOUS
  Filled 2017-03-05: qty 5

## 2017-03-05 MED ORDER — SODIUM CHLORIDE 0.9 % IV BOLUS (SEPSIS)
1000.0000 mL | Freq: Once | INTRAVENOUS | Status: AC
  Administered 2017-03-05: 1000 mL via INTRAVENOUS

## 2017-03-05 MED ORDER — LACTATED RINGERS IV BOLUS (SEPSIS)
500.0000 mL | Freq: Once | INTRAVENOUS | Status: AC
Start: 2017-03-05 — End: 2017-03-05
  Administered 2017-03-05: 500 mL via INTRAVENOUS

## 2017-03-05 MED ORDER — METOPROLOL TARTRATE 5 MG/5ML IV SOLN
5.0000 mg | Freq: Four times a day (QID) | INTRAVENOUS | Status: DC
  Administered 2017-03-05 – 2017-03-06 (×4): 5 mg via INTRAVENOUS
  Filled 2017-03-05 (×4): qty 5

## 2017-03-05 MED ORDER — ONDANSETRON HCL 4 MG/2ML IJ SOLN: 4.0000 mg | Freq: Three times a day (TID) | INTRAMUSCULAR | Status: DC | PRN

## 2017-03-05 MED ORDER — ORAL CARE MOUTH RINSE
15.0000 mL | Freq: Two times a day (BID) | OROMUCOSAL | Status: DC
  Administered 2017-03-06: 15 mL via OROMUCOSAL

## 2017-03-05 MED ORDER — HYDROXYZINE HCL 50 MG/ML IM SOLN
25.0000 mg | Freq: Four times a day (QID) | INTRAMUSCULAR | Status: DC | PRN
  Filled 2017-03-05: qty 0.5

## 2017-03-05 NOTE — H&P (Signed)
History and Physical    Cesar Maldonado ZOX:[id number] DOB: 10/24/1924 DOA: 03/04/2017  Referring MD/NP/PA:   PCP: Dorothyann Peng, MD   Patient coming from:  The patient is coming from inpt rehab.  At baseline, pt is dependent for most of ADL.       Chief Complaint: Acute respiratory distress  HPI: Cesar Maldonado is a 84 y.o. male with medical history significant of hypertension, dCHF, dementia, BPH, CKD-2, recent admission for right basal ganglia/thalamus hematoma and SAH, currrently doing inpt rehab, who was found to have acute respiratory distress. We are called to consult on this pt.  Pt was recently admitted to neurology ICU due to right basal ganglia/thalamus hematoma from a 2/14-12/19. Patient is stabilized and transferred to inpatient rehabilitation unit today (2/19). Pt is being treated for aspiration pneumonia with IV Zosyn currently.Per nurse report, pt vomited and possibly aspirated the emesis at about 5:00 PM, leading to acute respiratory distress. He tachypnea with RR upto 50s and tachycardia with HR up to 150s. His oxygen saturation decreased to 86% of the report. Stat ABG showed pH 7.498, PCO2 26, PO2 of 60.9. Pt has dementia. Per nurse, patient has very minimal unresponsive ever since his recent CVA. He moves his extremities slightly. His temperature is 99.1. PCCM was consulted. Dr. Clair Gulling spoke to family and code status changed to DNR.   Review of Systems:  could not be reviewed due to dementia and minimal responsiveness.   Allergy: No Known Allergies  Past Medical History:  Diagnosis Date  . BPH (benign prostatic hyperplasia)   . 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 Dementia   . Hypertension   . Left ventricular diastolic dysfunction, NYHA class 1   . Mental disorder    alzhiemers  . Syncope    Cardionet 04/14/12    Past Surgical History:  Procedure Laterality Date  . BREAST SURGERY     left tumor  . PARS PLANA VITRECTOMY  12/19/2010   Procedure: PARS PLANA VITRECTOMY WITH INTRAOCULAR LENS;  Surgeon: Shade Flood, MD;  Location: [facility] OR;  Service: Ophthalmology;  Laterality: Right;  PARS PLANA VITRECTOMY WITH INTRAOCULAR LENS [900]membrane peel,endolaser right eye    Social History:  reports that he quit smoking about 29 years ago. he has never used smokeless tobacco. He reports that he does not drink alcohol or use drugs.  Family History:  Family History  Problem Relation Age of Onset  . Cancer Sister      Prior to Admission medications   Medication Sig Start Date End Date Taking? Authorizing Provider  acidophilus (RISAQUAD) CAPS capsule Take 1 capsule by mouth every other day.    [provider]  cholecalciferol (VITAMIN D) 1000 units tablet Take 1,000 Units by mouth every other day.    [provider]  diltiazem (CARTIA XT) 120 MG 24 hr capsule Take 120 mg by mouth daily.    [provider]  dorzolamide-timolol (COSOPT) 22.3-6.8 MG/ML ophthalmic solution Place 1 drop into both eyes 2 (two) times daily.     [provider]  lactose free nutrition (BOOST) LIQD Take 237 mLs by mouth every other day.     [provider]  latanoprost (XALATAN) 0.005 % ophthalmic solution Place 1 drop into both eyes at bedtime.    [provider]  Multiple Vitamins-Minerals (CENTRUM PO) Take 1 tablet by mouth every other day.     [provider]  Physical Exam: Vitals:   03/04/17 2300 03/04/17 2357 03/05/17 0011 03/05/17 0100  BP: (!) 155/95   99/63  Pulse:  (!) 150  (!) 127  Resp:  (!) 38  (!) 45  Temp: (!) 101.2 F (38.4 C)  99 F (37.2 C)   TempSrc: Rectal  Axillary   SpO2:  99%  99%  Weight: 49.6 kg (109 lb 5.6 oz)      General: In acute respiratory  distress HEENT:       Eyes: PERRL, EOMI, no scleral icterus.       ENT: No discharge from the ears and nose      Neck: No JVD, no bruit, no mass felt. Heme: No neck  lymph node enlargement. Cardiac: S1/S2, RRR, No murmurs, No gallops or rubs. Respiratory: Has course breathing sound. GI: Soft, nondistended, nontender, no organomegaly, BS present. GU: No hematuria Ext: No pitting leg edema bilaterally. 2+DP/PT pulse bilaterally. Musculoskeletal: No joint deformities, No joint redness or warmth, no limitation of ROM in spin. Skin: No rashes.  Neuro: has demenita, not following command, cranial nerves II-XII grossly intact, slighlty moves extremities Psych: Patient is not psychotic   Labs on Admission: I have personally reviewed following labs and imaging studies  CBC: Recent Labs  Lab 02/27/17 1850 02/28/17 0916 03/01/17 0246 03/02/17 0943 03/03/17 0229 03/04/17 0421  WBC 6.8 7.4 6.8 10.6* 11.1* 13.8*  NEUTROABS 6.0  --   --   --   --   --   HGB 10.0* 9.7* 8.8* 9.2* 8.7* 8.3*  HCT 30.1* 29.9* 25.9* 28.3* 26.6* 25.4*  MCV 80.7 80.2 79.9 80.6 80.9 81.4  PLT 65* 75* 92* 98* 87* 78*   Basic Metabolic Panel: Recent Labs  Lab 02/28/17 0916 03/01/17 0246 03/02/17 0943 03/03/17 0229 03/04/17 0421  NA 133* 134* 140 143 147*  K 3.4* 5.7* 3.7 3.6 3.3*  CL 99* 101 107 110 113*  CO2 21* 19* 20* 20* 21*  GLUCOSE 92 84 91 93 154*  BUN 19 26* 26* 22* 26*  CREATININE 1.19 1.20 1.12 1.15 1.26*  CALCIUM 8.4* 8.2* 8.9 8.8* 8.9  MG  --   --   --   --  2.2  PHOS  --   --   --   --  2.1*   GFR: Estimated Creatinine Clearance: 26.8 mL/min (A) (by C-G formula based on SCr of 1.26 mg/dL (H)). Liver Function Tests: Recent Labs  Lab 02/27/17 1850  AST 71*  ALT 18  ALKPHOS 97  BILITOT 0.7  PROT 7.4  ALBUMIN 3.2*   No results for input(s): LIPASE, AMYLASE in the last 168 hours. No results for input(s): AMMONIA in the last 168 hours. Coagulation Profile: No results for input(s): INR, PROTIME in the last 168 hours. Cardiac Enzymes: No results for input(s): CKTOTAL, CKMB, CKMBINDEX, TROPONINI in the last 168 hours. BNP (last 3 results) No results  for input(s): PROBNP in the last 8760 hours. HbA1C: No results for input(s): HGBA1C in the last 72 hours. CBG: Recent Labs  Lab 03/03/17 1633 03/04/17 1636  GLUCAP 94 123*   Lipid Profile: No results for input(s): CHOL, HDL, LDLCALC, TRIG, CHOLHDL, LDLDIRECT in the last 72 hours. Thyroid Function Tests: No results for input(s): TSH, T4TOTAL, FREET4, T3FREE, THYROIDAB in the last 72 hours. Anemia Panel: No results for input(s): VITAMINB12, FOLATE, FERRITIN, TIBC, IRON, RETICCTPCT in the last 72 hours. Urine analysis:    Component Value Date/Time   COLORURINE YELLOW 02/28/2017 0620   APPEARANCEUR  CLOUDY (A) 02/28/2017 0620   LABSPEC 1.029 02/28/2017 0620   PHURINE 5.0 02/28/2017 0620   GLUCOSEU NEGATIVE 02/28/2017 0620   HGBUR NEGATIVE 02/28/2017 0620   BILIRUBINUR NEGATIVE 02/28/2017 0620   KETONESUR 5 (A) 02/28/2017 0620   PROTEINUR 30 (A) 02/28/2017 0620   UROBILINOGEN 0.2 10/21/2013 0928   NITRITE NEGATIVE 02/28/2017 0620   LEUKOCYTESUR NEGATIVE 02/28/2017 0620   Sepsis Labs: @LABRCNTIP (procalcitonin:4,lacticidven:4) ) Recent Results (from the past 240 hour(s))  Blood Culture (routine x 2)     Status: None   Collection Time: 02/27/17  8:15 PM  Result Value Ref Range Status   Specimen Description   Final    BLOOD LEFT FOREARM Performed at [facility], [address], [place], [place] [address]    Special Requests   Final    BOTTLES DRAWN AEROBIC AND ANAEROBIC Blood Culture results may not be optimal due to an inadequate volume of blood received in culture bottles Performed at [facility], [address], [place], [place] [address]    Culture   Final    NO GROWTH 5 DAYS Performed at [facility], [address] [place], [place] [address]    Report Status 03/04/2017 FINAL  Final  MRSA PCR Screening     Status: None   Collection Time: 02/27/17 11:14 PM  Result Value Ref Range Status   MRSA by PCR NEGATIVE NEGATIVE  Final    Comment:        The GeneXpert MRSA Assay (FDA approved for NASAL specimens only), is one component of a comprehensive MRSA colonization surveillance program. It is not intended to diagnose MRSA infection nor to guide or monitor treatment for MRSA infections. Performed at [facility], [address], [place], [place] [address]      Radiological Exams on Admission: Dg Chest Port 1 View  Result Date: 03/04/2017 CLINICAL DATA:  Shortness of Breath EXAM: PORTABLE CHEST 1 VIEW COMPARISON:  March 02, 2017 FINDINGS: Feeding tube tip is distal to the level of the stomach. There is patchy airspace opacity in both lower lung zones as well as in the right mid lung with progression since recent study. Heart size and pulmonary vascularity are normal. No adenopathy. There is aortic atherosclerosis. No evident bone lesions. IMPRESSION: Multifocal pneumonia with areas of patchy infiltrate in the right mid lung and both lung bases. Heart size normal. There is aortic atherosclerosis. Feeding tube tip is distal to the stomach. Aortic Atherosclerosis (ICD10-I70.0). Electronically Signed   By: Bretta Bang III M.D.   On: 03/04/2017 19:42     EKG: Independently reviewed.  Sinus rhythm, QTC 571, poor R-wave progression, T-wave inversion in precordial leads.  Assessment/Plan Principal Problem:   Aspiration pneumonia due to gastric secretions (HCC) Active Problems:   Hypertension   Dementia   Left ventricular diastolic dysfunction, NYHA class 1   Acute respiratory failure with hypoxia (HCC)   CKD (chronic kidney disease), stage III (HCC)   Protein-calorie malnutrition, severe (HCC)   Basal ganglia hemorrhage (HCC)   ICH (intracerebral hemorrhage) (HCC)   Aspiration pneumonia (HCC)   Sepsis (HCC)   Acute respiratory failure with hypoxia due to aspiration pneumonia due to gastric secretions and sepsis: CXR showed multifocal pneumonia with areas of patchy infiltrate in the right  mid lung and both lung bases. Patient is critically for sepsis with leukocytosis, tachycardia, tachypnea. Lactic acid is elevated at 4.1. Currently hemodynamically stable. PCCM and was consulted. Dr. Merlene Pulling had an extensive  discussion with her family. Family want pt be DNR.   - will admit to SDU bed as inpt - highly appreciate Dr. Danton SewerHammonds's consultation - IV Zosyn  - Mucinex for cough  - atroven Xopenex Neb prn for SOB - Urine legionella and S. pneumococcal antigen - Follow up blood culture x2, sputum culture and respiratory virus panel - will get Procalcitonin and trend lactic acid level per sepsis protocol - IVF: 1.5L of NS bolus in ED, followed by 100 mL per hour of NS (patient has a diastolic congestive heart failure, limiting aggressive IV fluids treatment) -palliative consult  Hypertension: -continue home Dildiazem -prn hydralazine IV  Left ventricular diastolic dysfunction, NYHA class 1: 2-D echo on 12/28/17 showed EF of 55-60%. Patient does not have leg edema. CHF seems to be compensated. -Check BNP  CKD (chronic kidney disease), stage III (HCC): close to baseline. Baseline creatinine 1.1-1.2. His creatinine is 1.26. -Follow up renal function by BMP  Protein-calorie malnutrition, severe (HCC): -nutrition consult  ICH: pt has basal ganglia/thalamus hematoma. MRI on 02/28/17 showed evolving right basal ganglia/thalamus hematoma, stable mass effect and small volume SAH layering in occipital horns.   -will observe closely -Frequent neurologic checks  Hypokalemia: K= 3,3  on admission. - Repleted  DVT ppx: SCD Code Status: DNR Family Communication:    Yes, patient's daughter by phone Disposition Plan:  Anticipate discharge back to previous home environment Consults called:  PCCM, Dr. Spero CurbHammods Admission status:  SDU/inpation       Date of Service 03/05/2017    Lorretta HarpXilin Raynell Upton [facility] Pager [phone]  If 7PM-7AM, please contact  night-coverage [email or web address] Password [email or web address] 03/05/2017, 1:22 AM

## 2017-03-05 NOTE — Progress Notes (Addendum)
Initial Nutrition Assessment  DOCUMENTATION CODES:   Severe malnutrition in context of chronic illness, Underweight  INTERVENTION:   If within goals of care, recommend resume TF via Cortrak tube:  Jevity 1.2 at 60 ml/h to provide 1728 kcal, 80 gm protein, 1166 ml free water daily  When TF initiated, monitor magnesium, potassium, and phosphorus daily for at least 3 days, MD to replete as needed, as pt is at risk for refeeding syndrome given severe PCM.  NUTRITION DIAGNOSIS:   Severe Malnutrition related to chronic illness(Alzheimer's dementia) as evidenced by severe fat depletion, severe muscle depletion.  GOAL:   Patient will meet greater than or equal to 90% of their needs  MONITOR:   Labs, I & O's  REASON FOR ASSESSMENT:   Consult, Malnutrition Screening Tool Assessment of nutrition requirement/status  ASSESSMENT:   84 yo male with PMH of Alzheimer's dementia, syncope, L ventricular diastolic dysfunction, CVD, and HTN who was admitted to inpatient rehab unit on 2/19 due to recent stroke. Developed respiratory distress after vomiting and required transfer back to acute care on 2/19.  Patient sleeping during RD visit; he did not awaken when name called.  Currently NPO.  Cortrak tube placed 2/18, not currently receiving TF. Previously on Jevity 1.2. Palliative Care consult has been ordered. Labs reviewed. Sodium 147 (H), potassium 3.3 (L), phosphorus 2.1 (L) Medications reviewed and include Vitamin D and MVI.  NUTRITION - FOCUSED PHYSICAL EXAM:    Most Recent Value  Orbital Region  Moderate depletion  Upper Arm Region  Severe depletion  Thoracic and Lumbar Region  Unable to assess  Buccal Region  Severe depletion  Temple Region  Severe depletion  Clavicle Bone Region  Severe depletion  Clavicle and Acromion Bone Region  Severe depletion  Scapular Bone Region  Unable to assess  Dorsal Hand  Severe depletion  Patellar Region  Severe depletion  Anterior Thigh Region   Severe depletion  Posterior Calf Region  Moderate depletion  Edema (RD Assessment)  None  Hair  Reviewed  Eyes  Unable to assess  Mouth  Unable to assess  Skin  Reviewed  Nails  Reviewed       Diet Order:  Diet NPO time specified Except for: Sips with Meds, Ice Chips  EDUCATION NEEDS:   No education needs have been identified at this time  Skin:  Skin Assessment: Reviewed RN Assessment  Last BM:  2/20  Height:   Ht Readings from Last 1 Encounters:  03/04/17 5\' 8"  (1.727 m)    Weight:   Wt Readings from Last 1 Encounters:  03/05/17 109 lb 5.6 oz (49.6 kg)    Ideal Body Weight:  70 kg  BMI:  Body mass index is 16.63 kg/m.  Estimated Nutritional Needs:   Kcal:  1600-1800  Protein:  80-90 gm  Fluid:  1.6 L    Joaquin CourtsKimberly Xue Low, RD, LDN, CNSC Pager [phone] After Hours Pager [phone]

## 2017-03-05 NOTE — Discharge Summary (Signed)
Physician Discharge Summary  Patient ID: Cesar Maldonado MRN: [id number] DOB/AGE: 08/06/1924 84 y.o.  Admit date: 03/04/2017 Discharge date: 2/190/2019  Discharge Diagnoses:  Principal Problem: Aspiration pneumonia due to gastric secretions [facility])  Active Problems:   CKD (chronic kidney disease), stage III (HCC)   Basal ganglia hemorrhage (HCC)   Discharged Condition: Guarded.   Significant Diagnostic Studies: Ct Angio Head W Or Wo Contrast  Result Date: 02/28/2017 CLINICAL DATA:  Follow-up intracranial hemorrhage. History of Alzheimer's disease. EXAM: CT ANGIOGRAPHY HEAD TECHNIQUE: Multidetector CT imaging of the head was performed using the standard protocol during bolus administration of intravenous contrast. Multiplanar CT image reconstructions and MIPs were obtained to evaluate the vascular anatomy. CONTRAST:  50mL ISOVUE-370 IOPAMIDOL (ISOVUE-370) INJECTION 76% COMPARISON:  CT HEAD February 27, 2017 at 1944 hours FINDINGS: CT HEAD BRAIN: Stable 3.3 x 2.4 x 2.7 cm RIGHT basal ganglia to thalamus intraparenchymal hemorrhage similar local mass effect with 5 mm RIGHT to LEFT midline shift. Intraventricular extension with layering blood products in occipital horns. Severe parenchymal brain volume loss most conspicuous within temporal lobes without hydrocephalus. Confluent supratentorial white matter hypodensities. No acute large vascular territory infarct. No abnormal extra-axial fluid collections. VASCULAR: Mild calcific atherosclerosis of the carotid siphons. SKULL: No skull fracture. No significant scalp soft tissue swelling. Frontal scalp scarring. SINUSES/ORBITS: Minimal frothy secretions RIGHT posterior ethmoid air cells. Subcentimeter LEFT maxillary mucosal retention. Trace LEFT mastoid effusion. The included ocular globes and orbital contents are non-suspicious. Status post bilateral ocular lens implants. OTHER: None. CTA HEAD ANTERIOR CIRCULATION: Patent cervical internal carotid arteries,  petrous, cavernous and supra clinoid internal carotid arteries. Patent anterior communicating artery. Patent anterior and middle cerebral arteries. Tandem severe stenosis bilateral ACA. Severe stenosis proximal M2 segment. No large vessel occlusion, contrast extravasation or aneurysm. POSTERIOR CIRCULATION: Patent vertebral arteries, vertebrobasilar junction and basilar artery, as well as main branch vessels. Patent posterior cerebral arteries. Moderate tandem stenoses bilateral posterior cerebral arteries. Symmetric severe stenosis bilateral P3 segments. No large vessel occlusion, contrast extravasation or aneurysm. VENOUS SINUSES: Major dural venous sinuses are patent though not tailored for evaluation on this angiographic examination. ANATOMIC VARIANTS: None. DELAYED PHASE: Not performed. MIP images reviewed. IMPRESSION: CT HEAD: 1. Stable evolving acute RIGHT basal ganglia/thalamus hematoma with intraventricular extension. No hydrocephalus. 2. Stable regional mass effect with 5 mm RIGHT to LEFT midline shift. 3. Stable severe atrophy and chronic small vessel ischemic disease. CTA HEAD: 1. No emergent large vessel occlusion, no aneurysm. 2. Intracranial atherosclerosis resulting in multifocal severe stenosis anterior and posterior circulation. Electronically Signed   By: Awilda Metro M.D.   On: 02/28/2017 01:41   Dg Chest 2 View  Result Date: 02/27/2017 CLINICAL DATA:  Cough and shortness of breath. EXAM: CHEST  2 VIEW COMPARISON:  02/23/2017 FINDINGS: The cardiac silhouette, mediastinal and hilar contours are within normal limits and stable. There is mild tortuosity of the thoracic aorta. Chronic underlying emphysema. New bilateral pleural effusions and left lower lobe infiltrate IMPRESSION: Left lower lobe pneumonia and bilateral pleural effusions. Electronically Signed   By: Rudie Meyer M.D.   On: 02/27/2017 19:34   Dg Chest 2 View  Result Date: 02/23/2017 CLINICAL DATA:  Weakness EXAM: CHEST  2  VIEW COMPARISON:  04/17/2016 FINDINGS: There is hyperinflation of the lungs compatible with COPD. Small bilateral effusions on the lateral view. No confluent airspace opacities. Heart is normal size. : Small bilateral pleural effusions.  COPD. Electronically Signed   By: Charlett Nose M.D.   On:  02/23/2017 13:01   Ct Head Wo Contrast  Result Date: 02/27/2017 CLINICAL DATA:  Head trauma, moderate to severe, low G CS. Fall on Wednesday evening with altered mental status. EXAM: CT HEAD WITHOUT CONTRAST TECHNIQUE: Contiguous axial images were obtained from the base of the skull through the vertex without intravenous contrast. COMPARISON:  02/23/2017 FINDINGS: Brain: 3.3 x 2.4 x 2.5 cm (~20cc) hematoma centered in the right basal ganglia, centered at the genu of the internal capsule and contacting the putamen, caudate head, anterior thalamus. There is intraventricular extension with layering hazy clot in the occipital horns. There is ventriculomegaly, but stable from prior and attributed to advanced brain atrophy. Remote lacunar infarct in the left caudate head. No evidence of cortical infarct. Hazy low-density in the periventricular white matter. Vascular: Atherosclerotic calcification. Skull: No acute or aggressive finding. Sinuses/Orbits: Bilateral cataract resection. Other: Critical Value/emergent results were called by telephone at the time of interpretation on 02/27/2017 at 8:02 pm to Dr. Charlestine NightHRISTOPHER LAWYER , who verbally acknowledged these results. IMPRESSION: 1. ~20 cc hematoma centered in the right basal ganglia and internal capsule. There is intraventricular hemorrhage with blood layering in the occipital horns of lateral ventricles. 2. Severe brain atrophy with chronic small vessel ischemia. Electronically Signed   By: Marnee SpringJonathon  Watts M.D.   On: 02/27/2017 20:02   Ct Head Wo Contrast  Result Date: 02/23/2017 CLINICAL DATA:  84 year old male with altered level of consciousness. EXAM: CT HEAD WITHOUT  CONTRAST TECHNIQUE: Contiguous axial images were obtained from the base of the skull through the vertex without intravenous contrast. COMPARISON:  07/23/2015 MR and prior studies FINDINGS: Brain: No evidence of acute infarction, hemorrhage, hydrocephalus, extra-axial collection or mass lesion/mass effect. Moderate to severe atrophy, chronic small-vessel white matter ischemic changes and remote left basal ganglia infarct are again noted. Vascular: Intracranial atherosclerotic calcifications identified. Skull: Normal. Negative for fracture or focal lesion. Sinuses/Orbits: No acute finding. Other: None IMPRESSION: 1. No evidence of acute intracranial abnormality 2. Atrophy, chronic small-vessel white matter ischemic changes and remote left basal ganglia infarct. Electronically Signed   By: Harmon PierJeffrey  Hu M.D.   On: 02/23/2017 14:16   Mr Brain Wo Contrast  Result Date: 03/01/2017 CLINICAL DATA:  Follow-up intracranial hemorrhage. EXAM: MRI HEAD WITHOUT CONTRAST TECHNIQUE: Multiplanar, multiecho pulse sequences of the brain and surrounding structures were obtained without intravenous contrast. COMPARISON:  CT HEAD February 29, 2016 FINDINGS: Multiple sequences are moderately motion degraded. INTRACRANIAL CONTENTS: No reduced diffusion to suggest acute ischemia. Spurious reduced diffusion about the occipital horns and, basilar subarachnoid space compatible with blood products, associated susceptibility artifact. Susceptibility artifact associated with the RIGHT basal ganglia to thalamus hematoma with surrounding T2 bright vasogenic edema extending to RIGHT midbrain. Similar regional mass effect and 4 mm RIGHT to LEFT midline shift. Severe parenchymal brain volume loss without hydrocephalus. Layering blood products within the occipital horns as seen on prior CT. Patchy to confluent supratentorial and pontine white matter FLAIR T2 hyperintensities. Prominent basal ganglia T2 hyperintensities on the LEFT compatible with  chronic small vessel ischemic disease. Old LEFT basal ganglia lacunar infarct. No abnormal extra-axial fluid collections. VASCULAR: Normal major intracranial vascular flow voids present at skull base. SKULL AND UPPER CERVICAL SPINE: No abnormal sellar expansion. Heterogeneous bone marrow signal. Severe RIGHT cervical facet arthropathy. Craniocervical junction maintained. SINUSES/ORBITS: Minimal LEFT mastoid effusion. The included ocular globes and orbital contents are non-suspicious. Status post bilateral ocular lens implants. OTHER: None. IMPRESSION: 1. Moderately motion degraded examination. 2. Evolving acute RIGHT basal ganglia/thalamus  hematoma with similar mass effect, 4 mm RIGHT to LEFT midline shift without ventricular entrapment. 3. Small volume redistributed subarachnoid hemorrhage. Similar layering blood products occipital horns. No hydrocephalus. 4. Severe parenchymal brain volume loss and chronic small vessel ischemic disease. Old LEFT basal ganglia lacunar infarct. Electronically Signed   By: Awilda Metro M.D.   On: 03/01/2017 00:28   Dg Chest Port 1 View  Result Date: 03/04/2017 CLINICAL DATA:  Shortness of Breath EXAM: PORTABLE CHEST 1 VIEW COMPARISON:  March 02, 2017 FINDINGS: Feeding tube tip is distal to the level of the stomach. There is patchy airspace opacity in both lower lung zones as well as in the right mid lung with progression since recent study. Heart size and pulmonary vascularity are normal. No adenopathy. There is aortic atherosclerosis. No evident bone lesions. IMPRESSION: Multifocal pneumonia with areas of patchy infiltrate in the right mid lung and both lung bases. Heart size normal. There is aortic atherosclerosis. Feeding tube tip is distal to the stomach. Aortic Atherosclerosis (ICD10-I70.0). Electronically Signed   By: Bretta Bang III M.D.   On: 03/04/2017 19:42   Dg Chest Port 1 View  Result Date: 03/02/2017 CLINICAL DATA:  Pneumonia. EXAM: PORTABLE CHEST  1 VIEW COMPARISON:  Chest x-ray dated February 27, 2017. FINDINGS: The heart size and mediastinal contours are within normal limits. Normal pulmonary vascularity. The lungs remain hyperinflated. Relatively unchanged reticulonodular interstitial and patchy alveolar opacities in the left lower lobe. No large pleural effusion. No pneumothorax. No acute osseous abnormality. IMPRESSION: Relatively unchanged left lower lobe infiltrate. Electronically Signed   By: Obie Dredge M.D.   On: 03/02/2017 09:37      Labs:  Basic Metabolic Panel: Recent Labs  Lab 02/27/17 1850 02/28/17 0916 03/01/17 0246 03/02/17 0943 03/03/17 0229 03/04/17 0421 03/05/17 0935  NA 130* 133* 134* 140 143 147* 152*  K 3.8 3.4* 5.7* 3.7 3.6 3.3* 4.0  CL 94* 99* 101 107 110 113* 121*  CO2 21* 21* 19* 20* 20* 21* 17*  GLUCOSE 103* 92 84 91 93 154* 93  BUN 20 19 26* 26* 22* 26* 28*  CREATININE 1.30* 1.19 1.20 1.12 1.15 1.26* 1.30*  CALCIUM 8.9 8.4* 8.2* 8.9 8.8* 8.9 8.3*  MG  --   --   --   --   --  2.2  --   PHOS  --   --   --   --   --  2.1*  --     CBC: Recent Labs  Lab 02/27/17 1850  03/03/17 0229 03/04/17 0421 03/05/17 0935  WBC 6.8   < > 11.1* 13.8* 24.1*  NEUTROABS 6.0  --   --   --  21.7*  HGB 10.0*   < > 8.7* 8.3* 8.6*  HCT 30.1*   < > 26.6* 25.4* 26.0*  MCV 80.7   < > 80.9 81.4 81.0  PLT 65*   < > 87* 78* 56*   < > = values in this interval not displayed.    CBG: Recent Labs  Lab 03/03/17 1633 03/04/17 1636  GLUCAP 94 123*    Brief HPI:   Cesar Maldonado a 84 y.o.RH male with history ofdementia,HTN, BPH who was admitted on 02/27/17 with progressive issues with po intake, lethargy, abdominal pain and weakness. Was seen in ED 2/10 and treated for dehydration--CT head negative. Reports of fall 2 days PTA. RepeatCT the headdoneshowed acute right basal ganglia/thalamus hematoma. CTA of headwas negative for AVM and showed evolving right basal  ganglia/thalamus hematoma with IV  extension, stable right to left mass effect,stable severe atrophy and multifocal severe stenosis in anterior and posterior circulation due to atherosclerosis. MRI brain showed evolving right basal ganglia/thalamus hematoma, stable mass effect and small volume SAH layering in occipital horns. Carotid dopplers without significant ICA stenosis. 2D echo with EF 55-60% with moderately thickened MV and moderate TVR.   CXR showed LLL pneumoniaand was monitored till he developed fever 2/17. Repeat CXR without improvement therefore Zosyn added for treatment.Swallow evaluation done and patient and limited due to lethargy as well as minimal efforts at swallow--kept NPO with cortak for nutritional support and family would like to monitor progress prior to decision regarding PEG placement. Patient with improvement in mentation, oriented to self only and able to follow simple with increased time. CIR was recommended due to functional deficits.    Hospital Course: KOHLE WINNER was admitted to rehab 03/04/2017 for inpatient therapies to consist of PT, ST and OT at least three hours five days a week. He was noted to be lethargic with bouts of awareness. He was treated with enema due to reports of constipation. He developed regurgitation with episode of emesis that was cleared with suctioning.  He developed hypoxic with tachycardia later that evening.  CXR was done revealing patchy infiltrate in right mid lung and both lung bases. He developed respiratory distress with increased WOB and fever. [facility] hospitalist and PCCM were consulted to transfer patient to acute services due to decline in medical status.      Disposition:  Discharge to acute floor  Diet: NPO   Allergies as of 03/04/2017   No Known Allergies     Medication List    ASK your doctor about these medications   acidophilus Caps capsule Take 1 capsule by mouth every other day.   CARTIA XT 120 MG 24 hr capsule Generic drug:  diltiazem Take  120 mg by mouth daily.   CENTRUM PO Take 1 tablet by mouth every other day.   cholecalciferol 1000 units tablet Commonly known as:  VITAMIN D Take 1,000 Units by mouth every other day.   dorzolamide-timolol 22.3-6.8 MG/ML ophthalmic solution Commonly known as:  COSOPT Place 1 drop into both eyes 2 (two) times daily.   lactose free nutrition Liqd Take 237 mLs by mouth every other day.   latanoprost 0.005 % ophthalmic solution Commonly known as:  XALATAN Place 1 drop into both eyes at bedtime.        Signed: Jacquelynn Cree 03/05/2017, 4:51 PM

## 2017-03-05 NOTE — Progress Notes (Signed)
PROGRESS NOTE    Patient: Cesar Maldonado     PCP: Dorothyann Peng, MD                    DOB: 05-Apr-1924            DOA: 03/04/2017 ZOX:[id number]             DOS: 03/05/2017, 2:09 PM   LOS: 1 day   Date of Service: The patient was seen and examined on 03/05/2017  Subjective:   Patient was seen and examined this morning, barely awake, able to open his eyes to pain stimuli, does not follow any commands, nonverbal, withdraws his extremities with pain stimuli. NG tube in place Temp this morning 100.3, heart rate range about 89-152 respiratory 35 blood pressure currently stable to 118/60 Satting 96% on 2 L of oxygen. Family present at bedside  ----------------------------------------------------------------------------------------------------------------------  Brief Narrative:   Cesar Maldonado is a 84 y.o. male with medical history significant of hypertension, dCHF, dementia, BPH, CKD-2, recent admission for right basal ganglia/thalamus hematoma and SAH, currrently doing inpt rehab, who was found to have acute respiratory distress. We are called to consult on this pt.  Pt was recently admitted to neurology ICU due to right basal ganglia/thalamus hematoma from a 2/14-12/19. Patient is stabilized and transferred to inpatient rehabilitation unit today (2/19). Pt is being treated for aspiration pneumonia with IV Zosyn currently.Per nurse report, pt vomited and possibly aspirated the emesis at about 5:00 PM, leading to acute respiratory distress. He tachypnea with RR upto 50s and tachycardia with HR up to 150s. His oxygen saturation decreased to 86% of the report. Stat ABG showed pH 7.498, PCO2 26, PO2 of 60.9. Pt has dementia. Per nurse, patient has very minimal unresponsive ever since his recent CVA. He moves his extremities slightly. His temperature is 99.1. PCCM was consulted. Dr. Clair Gulling spoke to family and code status changed to DNR.   Assessment & Plan:   Acute respiratory failure with  hypoxia due to aspiration pneumonia due to gastric secretions and sepsis: CXR showed multifocal pneumonia with areas of patchy infiltrate in the right mid lung and both lung bases.  - Keep the patient n.p.o. - DNR/DNI status has been established - Continue O2 via nasal cannula, PRN nonrebreather will mask, BiPAP  Sepsis secondary 2 aspiration pneumonia  -Patient is critically for sepsis with leukocytosis, tachycardia, tachypnea. Lactic acid is elevated at 4.1.---> 3.4 Currently hemodynamically stable. PCCM and was consulted. Dr. Merlene Pulling had an extensive discussion with her family. Family want pt be DNR.   - highly appreciate Dr. Danton Sewer consultation - IV Zosyn / VANC  - atroven Xopenex Neb prn for SOB - Urine legionella and S. pneumococcal antigen - Follow up blood culture x2, sputum culture and respiratory virus panel - we will F/up with Procalcitonin and trend lactic acid levels  - Due to hypernatremia will change IV fluid to D5 half-normal saline (patient has a diastolic congestive heart failure, limiting aggressive IV fluids treatment) -palliative consult  Hypertension: -continue home Dildiazem - Since n.p.o., shortage of diltiazem, was switched to metoprolol 5 mg IV every 6 hours -  -prn hydralazine IV  Left ventricular diastolic dysfunction, NYHA class 1: 2-D echo on 12/28/17 showed EF of 55-60%. Patient does not have leg edema. CHF seems to be compensated. -Check BNP  CKD (chronic kidney disease), stage III (HCC): close to baseline. Baseline creatinine 1.1-1.2. His creatinine is 1.30 -Follow up renal function by BMP  Protein-calorie malnutrition,  severe [facility](HCC): -nutrition consult - Currently n.p.o., NG tube in place  ICH: pt has basal ganglia/thalamus hematoma. MRI on 02/28/17 showed evolving right basal ganglia/thalamus hematoma, stable mass effect and small volume SAH layering in occipital horns.  -will observe closely -Frequent neurologic checks  Hypokalemia:  K= 3,3  on admission. - Repleted - Currently 4.0  Hypernatremic - Change fluids to D5 half-normal saline   DVT ppx: SCD Code Status: DNR Family Communication:    Yes, patient's daughter by phone Disposition Plan:  Anticipate discharge back to previous home environment Consults called:  PCCM, Dr. Spero CurbHammods Palliative care    Procedures:    None   Antimicrobials:  Anti-infectives (From admission, onward)   Start     Dose/Rate Route Frequency Ordered Stop   03/04/17 2300  piperacillin-tazobactam (ZOSYN) IVPB 3.375 g     3.375 g 12.5 mL/hr over 240 Minutes Intravenous Every 8 hours 03/04/17 2244          Objective: Vitals:   03/05/17 0740 03/05/17 0901 03/05/17 1132 03/05/17 1402  BP: 118/60     Pulse: 97 95    Resp:  (!) 26  (!) 35  Temp: 98.2 F (36.8 C)  98.6 F (37 C) 100.3 F (37.9 C)  TempSrc: Axillary  Axillary Rectal  SpO2: 98% 96%    Weight:      Height:        Intake/Output Summary (Last 24 hours) at 03/05/2017 1409 Last data filed at 03/05/2017 0830 Gross per 24 hour  Intake 1680 ml  Output 500 ml  Net 1180 ml   Filed Weights   03/04/17 2300 03/05/17 0408  Weight: 49.6 kg (109 lb 5.6 oz) 49.6 kg (109 lb 5.6 oz)    Examination:  General exam: He is barely responsive this morning, NG tube in place, Psychiatry: Judgement and insight appear normal. Mood & affect appropriate. HEENT: NG tube in place, otherwise within normal limits, left gaze Respiratory system: Breath sounds diffusely from mid lobe down, Cardiovascular system: Irregularly irregular, Gastrointestinal system: Abd. nondistended, soft and nontender. No organomegaly or masses felt. Normal bowel sounds heard. Central nervous system: Able to open his eyes to pain stimuli, does not follow any commands, nonverbal limited exam withdraws to pain GCS score of 8 Extremities: Symmetric 5 x 5 power. Skin: No rashes, lesions or ulcers Wounds:   Data Reviewed: I have personally reviewed following  labs and imaging studies  CBC: Recent Labs  Lab 02/27/17 1850  03/01/17 0246 03/02/17 0943 03/03/17 0229 03/04/17 0421 03/05/17 0935  WBC 6.8   < > 6.8 10.6* 11.1* 13.8* 24.1*  NEUTROABS 6.0  --   --   --   --   --  21.7*  HGB 10.0*   < > 8.8* 9.2* 8.7* 8.3* 8.6*  HCT 30.1*   < > 25.9* 28.3* 26.6* 25.4* 26.0*  MCV 80.7   < > 79.9 80.6 80.9 81.4 81.0  PLT 65*   < > 92* 98* 87* 78* 56*   < > = values in this interval not displayed.   Basic Metabolic Panel: Recent Labs  Lab 03/01/17 0246 03/02/17 0943 03/03/17 0229 03/04/17 0421 03/05/17 0935  NA 134* 140 143 147* 152*  K 5.7* 3.7 3.6 3.3* 4.0  CL 101 107 110 113* 121*  CO2 19* 20* 20* 21* 17*  GLUCOSE 84 91 93 154* 93  BUN 26* 26* 22* 26* 28*  CREATININE 1.20 1.12 1.15 1.26* 1.30*  CALCIUM 8.2* 8.9 8.8* 8.9 8.3*  MG  --   --   --  2.2  --   PHOS  --   --   --  2.1*  --    GFR: Estimated Creatinine Clearance: 26 mL/min (A) (by C-G formula based on SCr of 1.3 mg/dL (H)). Liver Function Tests: Recent Labs  Lab 02/27/17 1850 03/05/17 0935  AST 71* 64*  ALT 18 14*  ALKPHOS 97 82  BILITOT 0.7 1.1  PROT 7.4 5.4*  ALBUMIN 3.2* 2.0*   No results for input(s): LIPASE, AMYLASE in the last 168 hours. No results for input(s): AMMONIA in the last 168 hours. Coagulation Profile: No results for input(s): INR, PROTIME in the last 168 hours. Cardiac Enzymes: No results for input(s): CKTOTAL, CKMB, CKMBINDEX, TROPONINI in the last 168 hours. BNP (last 3 results) No results for input(s): PROBNP in the last 8760 hours. HbA1C: No results for input(s): HGBA1C in the last 72 hours. CBG: Recent Labs  Lab 03/03/17 1633 03/04/17 1636  GLUCAP 94 123*   Lipid Profile: No results for input(s): CHOL, HDL, LDLCALC, TRIG, CHOLHDL, LDLDIRECT in the last 72 hours. Thyroid Function Tests: No results for input(s): TSH, T4TOTAL, FREET4, T3FREE, THYROIDAB in the last 72 hours. Anemia Panel: No results for input(s): VITAMINB12,  FOLATE, FERRITIN, TIBC, IRON, RETICCTPCT in the last 72 hours. Sepsis Labs: Recent Labs  Lab 02/27/17 2019 03/04/17 2306 03/05/17 0344 03/05/17 0646  PROCALCITON  --  6.97  --   --   LATICACIDVEN 3.15* 4.1* 2.8* 3.4*    Recent Results (from the past 240 hour(s))  Blood Culture (routine x 2)     Status: None   Collection Time: 02/27/17  8:15 PM  Result Value Ref Range Status   Specimen Description   Final    BLOOD LEFT FOREARM Performed at [facility], [address], [place], [place] [address]    Special Requests   Final    BOTTLES DRAWN AEROBIC AND ANAEROBIC Blood Culture results may not be optimal due to an inadequate volume of blood received in culture bottles Performed at [facility], [address] [place], [place] [address]    Culture   Final    NO GROWTH 5 DAYS Performed at [facility], [address] [place], [place] [address]    Report Status 03/04/2017 FINAL  Final  MRSA PCR Screening     Status: None   Collection Time: 02/27/17 11:14 PM  Result Value Ref Range Status   MRSA by PCR NEGATIVE NEGATIVE Final    Comment:        The GeneXpert MRSA Assay (FDA approved for NASAL specimens only), is one component of a comprehensive MRSA colonization surveillance program. It is not intended to diagnose MRSA infection nor to guide or monitor treatment for MRSA infections. Performed at [facility], [address], [place], [place] [address]       Radiology Studies: Dg Chest Port 1 View  Result Date: 03/04/2017 CLINICAL DATA:  Shortness of Breath EXAM: PORTABLE CHEST 1 VIEW COMPARISON:  March 02, 2017 FINDINGS: Feeding tube tip is distal to the level of the stomach. There is patchy airspace opacity in both lower lung zones as well as in the right mid lung with progression since recent study. Heart size and pulmonary vascularity are normal. No adenopathy. There is aortic atherosclerosis. No evident bone  lesions. IMPRESSION: Multifocal pneumonia with areas of patchy infiltrate in the right mid lung and both lung bases. Heart size  normal. There is aortic atherosclerosis. Feeding tube tip is distal to the stomach. Aortic Atherosclerosis (ICD10-I70.0). Electronically Signed   By: Bretta Bang III M.D.   On: 03/04/2017 19:42    Scheduled Meds: . acidophilus  1 capsule Oral QODAY  . chlorhexidine  15 mL Mouth Rinse BID  . cholecalciferol  1,000 Units Oral QODAY  . diltiazem  120 mg Oral Daily  . dorzolamide-timolol  1 drop Both Eyes BID  . ipratropium  0.5 mg Nebulization Q6H  . lactose free nutrition  237 mL Oral QODAY  . latanoprost  1 drop Both Eyes QHS  . levalbuterol  1.25 mg Nebulization Q6H  . mouth rinse  15 mL Mouth Rinse q12n4p  . metoprolol tartrate  5 mg Intravenous Q6H  . multivitamin  15 mL Oral Daily   Continuous Infusions: . dextrose 5 % and 0.45% NaCl    . piperacillin-tazobactam (ZOSYN)  IV Stopped (03/05/17 4098)    Time spent: >25 minutes  Kendell Bane, MD [facility],  Pager [phone]  If 7PM-7AM, please contact night-coverage [email or web address]   Password [email or web address]  03/05/2017, 2:09 PM

## 2017-03-05 NOTE — IPOC Note (Signed)
Pt discharged shortly after admission for respiratory distress. No  IPOC  Ranelle OysterZachary T. Swartz, MD, [facility]APMR [facility] 03/05/2017

## 2017-03-05 NOTE — Progress Notes (Signed)
Notified MD about critical lab, lactic acid 4.1

## 2017-03-05 NOTE — Progress Notes (Signed)
Notified MD of HR in the 140s-150s. Orders to give Lopressor 5 mg and also Normal Saline bolus 1000 ml followed by Normal Saline maintenance fluids 15500ml/hr

## 2017-03-06 DIAGNOSIS — Z515 Encounter for palliative care: Secondary | ICD-10-CM

## 2017-03-06 DIAGNOSIS — F0150 Vascular dementia without behavioral disturbance: Secondary | ICD-10-CM

## 2017-03-06 DIAGNOSIS — S06349D Traumatic hemorrhage of right cerebrum with loss of consciousness of unspecified duration, subsequent encounter: Secondary | ICD-10-CM

## 2017-03-06 LAB — BASIC METABOLIC PANEL
ANION GAP: 14 (ref 5–15)
BUN: 26 mg/dL — ABNORMAL HIGH (ref 6–20)
CALCIUM: 8.2 mg/dL — AB (ref 8.9–10.3)
CHLORIDE: 119 mmol/L — AB (ref 101–111)
CO2: 18 mmol/L — ABNORMAL LOW (ref 22–32)
CREATININE: 1.42 mg/dL — AB (ref 0.61–1.24)
GFR calc non Af Amer: 42 mL/min — ABNORMAL LOW (ref >60)
GFR, EST AFRICAN AMERICAN: 48 mL/min — AB (ref >60)
Glucose, Bld: 110 mg/dL — ABNORMAL HIGH (ref 65–99)
Potassium: 3.4 mmol/L — ABNORMAL LOW (ref 3.5–5.1)
SODIUM: 151 mmol/L — AB (ref 135–145)

## 2017-03-06 LAB — LEGIONELLA PNEUMOPHILA SEROGP 1 UR AG: L. pneumophila Serogp 1 Ur Ag: NEGATIVE

## 2017-03-06 LAB — STREP PNEUMONIAE URINARY ANTIGEN: STREP PNEUMO URINARY ANTIGEN: NEGATIVE

## 2017-03-06 LAB — CBC
HCT: 26.7 % — ABNORMAL LOW (ref 39.0–52.0)
HEMOGLOBIN: 8.7 g/dL — AB (ref 13.0–17.0)
MCH: 26.4 pg (ref 26.0–34.0)
MCHC: 32.6 g/dL (ref 30.0–36.0)
MCV: 80.9 fL (ref 78.0–100.0)
Platelets: 77 10*3/uL — ABNORMAL LOW (ref 150–400)
RBC: 3.3 MIL/uL — ABNORMAL LOW (ref 4.22–5.81)
RDW: 14.6 % (ref 11.5–15.5)
WBC: 24.8 10*3/uL — AB (ref 4.0–10.5)

## 2017-03-06 LAB — LACTIC ACID, PLASMA: LACTIC ACID, VENOUS: 2.8 mmol/L — AB (ref 0.5–1.9)

## 2017-03-06 LAB — URINE CULTURE: Culture: 70000 — AB

## 2017-03-06 MED ORDER — BIOTENE DRY MOUTH MT LIQD: 15.0000 mL | OROMUCOSAL | Status: DC | PRN

## 2017-03-06 MED ORDER — SODIUM CHLORIDE 0.9 % IV SOLN: 250.0000 mL | INTRAVENOUS | Status: DC | PRN

## 2017-03-06 MED ORDER — MORPHINE SULFATE (CONCENTRATE) 10 MG/0.5ML PO SOLN: 5.0000 mg | ORAL | Status: DC | PRN

## 2017-03-06 MED ORDER — POLYVINYL ALCOHOL 1.4 % OP SOLN
1.0000 [drp] | Freq: Four times a day (QID) | OPHTHALMIC | Status: DC | PRN
  Filled 2017-03-06: qty 15

## 2017-03-06 MED ORDER — GLYCOPYRROLATE 0.2 MG/ML IJ SOLN: 0.2000 mg | INTRAMUSCULAR | Status: DC | PRN

## 2017-03-06 MED ORDER — HALOPERIDOL LACTATE 5 MG/ML IJ SOLN: 0.5000 mg | INTRAMUSCULAR | Status: DC | PRN

## 2017-03-06 MED ORDER — LORAZEPAM 2 MG/ML PO CONC: 1.0000 mg | ORAL | Status: DC | PRN

## 2017-03-06 MED ORDER — HALOPERIDOL LACTATE 2 MG/ML PO CONC
0.5000 mg | ORAL | Status: DC | PRN
  Filled 2017-03-06: qty 0.3

## 2017-03-06 MED ORDER — LORAZEPAM 2 MG/ML IJ SOLN: 1.0000 mg | INTRAMUSCULAR | Status: DC | PRN

## 2017-03-06 MED ORDER — SODIUM CHLORIDE 0.9% FLUSH: 3.0000 mL | INTRAVENOUS | Status: DC | PRN

## 2017-03-06 MED ORDER — SODIUM CHLORIDE 0.9% FLUSH
3.0000 mL | Freq: Two times a day (BID) | INTRAVENOUS | Status: DC
  Administered 2017-03-06 – 2017-03-07 (×2): 3 mL via INTRAVENOUS

## 2017-03-06 MED ORDER — LORAZEPAM 1 MG PO TABS: 1.0000 mg | ORAL_TABLET | ORAL | Status: DC | PRN

## 2017-03-06 MED ORDER — MORPHINE SULFATE (PF) 2 MG/ML IV SOLN
2.0000 mg | INTRAVENOUS | Status: DC | PRN
  Administered 2017-03-06 – 2017-03-07 (×2): 2 mg via INTRAVENOUS
  Administered 2017-03-07: 4 mg via INTRAVENOUS
  Administered 2017-03-07: 2 mg via INTRAVENOUS
  Administered 2017-03-07 (×2): 4 mg via INTRAVENOUS
  Filled 2017-03-06: qty 2
  Filled 2017-03-06: qty 1
  Filled 2017-03-06: qty 2
  Filled 2017-03-06 (×2): qty 1
  Filled 2017-03-06: qty 2

## 2017-03-06 MED ORDER — IPRATROPIUM BROMIDE 0.02 % IN SOLN
0.5000 mg | Freq: Three times a day (TID) | RESPIRATORY_TRACT | Status: DC
  Administered 2017-03-06 (×2): 0.5 mg via RESPIRATORY_TRACT
  Filled 2017-03-06 (×2): qty 2.5

## 2017-03-06 MED ORDER — HALOPERIDOL 0.5 MG PO TABS
0.5000 mg | ORAL_TABLET | ORAL | Status: DC | PRN
  Filled 2017-03-06: qty 1

## 2017-03-06 MED ORDER — MORPHINE SULFATE (PF) 2 MG/ML IV SOLN
1.0000 mg | INTRAVENOUS | Status: DC | PRN
  Administered 2017-03-06: 1 mg via INTRAVENOUS
  Filled 2017-03-06: qty 1

## 2017-03-06 MED ORDER — MORPHINE SULFATE (CONCENTRATE) 10 MG/0.5ML PO SOLN
5.0000 mg | ORAL | Status: DC
  Filled 2017-03-06: qty 0.5

## 2017-03-06 MED ORDER — LEVALBUTEROL HCL 1.25 MG/0.5ML IN NEBU: 1.2500 mg | INHALATION_SOLUTION | Freq: Four times a day (QID) | RESPIRATORY_TRACT | Status: DC | PRN

## 2017-03-06 MED ORDER — LEVALBUTEROL HCL 1.25 MG/0.5ML IN NEBU
1.2500 mg | INHALATION_SOLUTION | Freq: Three times a day (TID) | RESPIRATORY_TRACT | Status: DC
  Administered 2017-03-06 (×2): 1.25 mg via RESPIRATORY_TRACT
  Filled 2017-03-06 (×2): qty 0.5

## 2017-03-06 MED ORDER — DEXTROSE 5 % IV SOLN
INTRAVENOUS | Status: DC
  Administered 2017-03-06: 09:00:00 via INTRAVENOUS

## 2017-03-06 MED ORDER — GLYCOPYRROLATE 1 MG PO TABS: 1.0000 mg | ORAL_TABLET | ORAL | Status: DC | PRN

## 2017-03-06 NOTE — Consult Note (Signed)
Consultation Note Date: 03/06/2017   Patient Name: Cesar Maldonado  DOB: [birth year]  MRN: [id number]  Age / Sex: 84 y.o., male  PCP: Glendale Chard, MD Referring Physician: Deatra James, MD  Reason for Consultation: Establishing goals of care  HPI/Patient Profile: 84 y.o. male  with past medical history of D-HF, TIAs, Alzheimer's dementia who was admitted on 03/04/2017 with acute respiratory failure after an aspiration event.  Mr. Chestnut had just recently be discharged to [place] after suffering a fall with basal ganglia hematoma and subarachnoid hemorrhage.  PMT was consulted as the patient was declining rather than improving after 36 hours of hospital treatment.  Patient required 10L of oxygen, an N/G tube for feeding, he was found to have LLL pneumonia secondary to aspiration.  Clinical Assessment and Goals of Care:  I have reviewed medical records including EPIC notes, labs and imaging, received report from the care team, assessed the patient and then met at the bedside along with is wife, son [place]), and two daughters Suzi Roots and [place])  to discuss diagnosis prognosis, GOC, EOL wishes, disposition and options.  I introduced Palliative Medicine as specialized medical care for people living with serious illness. It focuses on providing relief from the symptoms and stress of a serious illness. The goal is to improve quality of life for both the patient and the family.  We discussed a brief life review of the patient. 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  He raised 4 children and was married to Mrs. Rodriques for 64 years.  He is a Engineer, manufacturing.  As far as functional and nutritional status his albumin is 2.0.  Due to severe aspiration and rapid respirations (50 per min) he is unable to take oral nutrition.  At this point he is bed bound.  He  suffered L sided weakness after his stroke.  Dr. Jamesetta Geralds met with the family and clearly explained the patient's rapid decline and dire predicament.  It was clearly explained that medically the best care would be palliative in nature as the patient is dying.  I attempted to elicit values and goals of care important to the patient.  The family does not want him to suffer and they ask for him to be made comfortable.  Hospice and Palliative Care services outpatient were explained and offered.  The family requests Hospice House at [facility].  Questions and concerns were addressed.  The family was encouraged to call with questions or concerns.    Primary Decision Maker:  NEXT OF KIN Wife and Children.    SUMMARY OF RECOMMENDATIONS    Full comfort D/C all interventions not related to comfort.   Add comfort medications. Social work consult placed for [employer].  Code Status/Advance Care Planning: DNR   Prognosis:  Hours to days.    Discharge Planning: Hospice facility      Primary Diagnoses: Present on Admission: . Protein-calorie malnutrition, severe ([place]) . Left ventricular diastolic dysfunction, NYHA class  1 . ICH (intracerebral hemorrhage) ([place]) . Hypertension . Dementia . CKD (chronic kidney disease), stage III ([place]) . Basal ganglia hemorrhage ([place]) . Acute respiratory failure with hypoxia ([place]) . Aspiration pneumonia due to gastric secretions ([place]) . Aspiration pneumonia ([place]) . Sepsis ([place])   I have reviewed the medical record, interviewed the patient and family, and examined the patient. The following aspects are pertinent.  Past Medical History:  Diagnosis Date  . BPH (benign prostatic hyperplasia)   . 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 Dementia   . Hypertension   . Left ventricular diastolic dysfunction, NYHA class 1   . Mental disorder    alzhiemers  . Syncope     Cardionet 04/14/12   Social History   Socioeconomic History  . Marital status: Married    Spouse name: [name]  . Number of children: 4  . Years of education: 57  . Highest education level: None  Social Needs  . Financial resource strain: None  . Food insecurity - worry: None  . Food insecurity - inability: None  . Transportation needs - medical: None  . Transportation needs - non-medical: None  Occupational History  . Occupation: Retired, plaster work  Tobacco Use  . Smoking status: Former Smoker    Last attempt to quit: 01/15/1988    Years since quitting: 29.1  . Smokeless tobacco: Never Used  Substance and Sexual Activity  . Alcohol use: No    Alcohol/week: 1.2 oz    Types: 2 Shots of liquor per week  . Drug use: No  . Sexual activity: None  Other Topics Concern  . None  Social History Narrative   Married.  Lives with wife.  Ambulates independently.   Caffeine use: Drinks coffee 1 cup per day at most   Drinks 1/2 can soda per day    Family History  Problem Relation Age of Onset  . Cancer Sister    Scheduled Meds: . chlorhexidine  15 mL Mouth Rinse BID  . cholecalciferol  1,000 Units Oral QODAY  . dorzolamide-timolol  1 drop Both Eyes BID  . latanoprost  1 drop Both Eyes QHS  . morphine CONCENTRATE  5 mg Oral Q4H  . sodium chloride flush  3 mL Intravenous Q12H   Continuous Infusions: . sodium chloride     PRN Meds:.sodium chloride, acetaminophen, antiseptic oral rinse, glycopyrrolate **OR** glycopyrrolate **OR** glycopyrrolate, haloperidol **OR** haloperidol **OR** haloperidol lactate, levalbuterol, LORazepam **OR** LORazepam **OR** LORazepam, morphine injection, morphine CONCENTRATE **OR** morphine CONCENTRATE, polyvinyl alcohol, sodium chloride flush No Known Allergies Review of Systems non verbal.  Minimally responsive to me, but responds to family by opening his eyes and attempted to speak.  Physical Exam  Thin frail elderly male, able to open his eyes and  mouth words CV tachy cardic with murmur Resp rapid respirations (50 per min) with increased work of breathing Abdomen thin, firm, nt, nd   Vital Signs: BP (!) 132/99 (BP Location: Left Arm)   Pulse (!) 110   Temp 98.7 F (37.1 C) (Axillary)   Resp (!) 44   Ht '5\' 8"'$  (1.727 m)   Wt 51.5 kg (113 lb 8.6 oz)   SpO2 98%   BMI 17.26 kg/m  Pain Assessment: Faces   Pain Score: 2    SpO2: SpO2: 98 % O2 Device:SpO2: 98 % O2 Flow Rate: .O2 Flow Rate (L/min): 2 L/min  IO: Intake/output summary:   Intake/Output Summary (Last  24 hours) at 03/06/2017 1447 Last data filed at 03/06/2017 8403 Gross per 24 hour  Intake 1278.75 ml  Output 525 ml  Net 753.75 ml    LBM: Last BM Date: 03/05/17 Baseline Weight: Weight: 49.6 kg (109 lb 5.6 oz) Most recent weight: Weight: 51.5 kg (113 lb 8.6 oz)     Palliative Assessment/Data: 20%     Time In: 1:00 Time Out: 3:00 Time Total: 120 Greater than 50%  of this time was spent counseling and coordinating care related to the above assessment and plan.  Signed by: Florentina Jenny, PA-C Palliative Medicine Pager: [phone]  Please contact Palliative Medicine Team phone at [phone] for questions and concerns.  For individual provider: See Shea Evans

## 2017-03-06 NOTE — Progress Notes (Signed)
Critical Result - Lactic Acid = 2.8. Dr. Flossie DibbleShahmehdi notified via [facility] Psmion text messaging.

## 2017-03-06 NOTE — Clinical Social Work Note (Signed)
Clinical Social Work Assessment  Patient Details  Name: Cesar Maldonado MRN: [id number] Date of Birth: [birth year]  Date of referral:  03/06/17               Reason for consult:  Facility Placement, End of Life/Hospice                Permission sought to share information with:  Facility Sport and exercise psychologist, Family Supports Permission granted to share information::  No(patient not alert, family members at bedside)  Name::     Designer, jewellery::  residential hospice  Relationship::  spouse  Contact Information:  [phone]  Housing/Transportation Living arrangements for the past 2 months:  Single Family Home Source of Information:  Spouse Patient Interpreter Needed:  None Criminal Activity/Legal Involvement Pertinent to Current Situation/Hospitalization:  No - Comment as needed Significant Relationships:  Adult Children, Spouse Lives with:  Spouse Do you feel safe going back to the place where you live?  Yes Need for family participation in patient care:  Yes (Comment)  Care giving concerns: Patient initially from home with wife, but most recently from [facility] inpatient rehab. Patient is now comfort care.  Social Worker assessment / plan: CSW received referral from palliative care team and from MD for residential hospice placement. CSW met with patient's spouse and two other family members. Patient was asleep. Patient's family is accepting of patient's transition to end of life care. They prefer [facility]. CSW made referral to [facility] at [place]. CSW to support with discharge to hospice when bed available.  Employment status:  Retired Research officer, political party) PT Recommendations:  Not assessed at this time Information / Referral to community resources:  Other (Comment Required)([facility])  Patient/Family's Response to care: Patient's family appreciative of care.  Patient/Family's Understanding of and Emotional Response to Diagnosis, Current  Treatment, and Prognosis: Patient's family with good understanding of patient's condition and of patient's prognosis.   Emotional Assessment Appearance:  Appears stated age Attitude/Demeanor/Rapport:  Unable to Assess Affect (typically observed):  Unable to Assess Orientation:  (responds to voice; difficult to assess orientation) Alcohol / Substance use:  Not Applicable Psych involvement (Current and /or in the community):  No (Comment)  Discharge Needs  Concerns to be addressed:  Other (Comment Required(end of life care) Readmission within the last 30 days:  Yes Current discharge risk:  Physical Impairment Barriers to Discharge:  Continued Medical Work up   Estanislado Emms, LCSW 03/06/2017, 3:26 PM

## 2017-03-06 NOTE — Progress Notes (Signed)
PROGRESS NOTE    Patient: Cesar Maldonado     PCP: Dorothyann PengSanders, Robyn, MD                    DOB: [birth year]            DOA: 03/04/2017 [id number]             DOS: 03/06/2017, 3:13 PM   LOS: 2 days   Date of Service: The patient was seen and examined on 03/06/2017  Subjective:    Patient was seen and examined this morning, unresponsive, grimaces and withdraws to pain stimuli.  GCS score of 7. Improving but is still elevated lactic acid, despite IV antibiotics WBC still > 24,000  Palliative care on board Family meeting this afternoon:  For family member including his wife was present at today's meeting.  Along with the palliative care PA-C [facility]: All findings were discussed with the patient family in detail including his previous stroke, progressive decline, aspiration, sepsis, poor prognosis. Recommendation to progress, and advanced to hospice care  The family has expressed understanding of all the findings, and agreeable to recommendations. Patient was confirmed DNR/DNI status Will pursue with hospice consultation Total of 35 minutes of just family meeting  ----------------------------------------------------------------------------------------------------------------------  Brief Narrative:   Cesar CanaryWillie E Cousin is a 84 y.o. male with medical history significant of hypertension, dCHF, dementia, BPH, CKD-2, recent admission for right basal ganglia/thalamus hematoma and SAH, currrently doing inpt rehab, who was found to have acute respiratory distress. We are called to consult on this pt.  Pt was recently admitted to neurology ICU due to right basal ganglia/thalamus hematoma from a 2/14-12/19. Patient is stabilized and transferred to inpatient rehabilitation unit today (2/19). Pt is being treated for aspiration pneumonia with IV Zosyn currently.Per nurse report, pt vomited and possibly aspirated the emesis at about 5:00 PM, leading to acute respiratory distress. He tachypnea  with RR upto 50s and tachycardia with HR up to 150s. His oxygen saturation decreased to 86% of the report. Stat ABG showed pH 7.498, PCO2 26, PO2 of 60.9. Pt has dementia. Per nurse, patient has very minimal unresponsive ever since his recent CVA. He moves his extremities slightly. His temperature is 99.1. PCCM was consulted. Dr. Clair GullingHemmonds spoke to family and code status changed to DNR.   Assessment & Plan:   Acute respiratory failure with hypoxia due to aspiration pneumonia due to gastric secretions and sepsis: CXR showed multifocal pneumonia with areas of patchy infiltrate in the right mid lung and both lung bases.  - Keep the patient n.p.o. - DNR/DNI status has been established - Continue O2 via nasal cannula, PRN nonrebreather will mask, BiPAP  Sepsis secondary 2 aspiration pneumonia  -Patient is critically for sepsis with leukocytosis, tachycardia, tachypnea. Lactic acid is elevated at 4.1.---> 3.4 Currently hemodynamically stable. PCCM and was consulted. Dr. Merlene PullingHammonds had an extensive discussion with her family. Family want pt be DNR.   - highly appreciate Dr. Danton SewerHammonds's consultation - IV Zosyn / VANC  - atroven Xopenex Neb prn for SOB - Urine legionella and S. pneumococcal antigen - Follow up blood culture x2, sputum culture and respiratory virus panel - we will F/up with Procalcitonin and trend lactic acid levels  - Due to hypernatremia will change IV fluid to D5 half-normal saline (patient has a diastolic congestive heart failure, limiting aggressive IV fluids treatment) -palliative consult  Hypertension: -continue home Dildiazem - Since n.p.o., shortage of diltiazem, was switched to metoprolol 5 mg IV every  6 hours -  -prn hydralazine IV  Left ventricular diastolic dysfunction, NYHA class 1: 2-D echo on 12/28/17 showed EF of 55-60%. Patient does not have leg edema. CHF seems to be compensated. -Check BNP  CKD (chronic kidney disease), stage III (HCC): close to baseline.  Baseline creatinine 1.1-1.2. His creatinine is 1.30 -Follow up renal function by BMP  Protein-calorie malnutrition, severe (HCC): -nutrition consult - Currently n.p.o., NG tube in place  ICH: pt has basal ganglia/thalamus hematoma. MRI on 02/28/17 showed evolving right basal ganglia/thalamus hematoma, stable mass effect and small volume SAH layering in occipital horns.  -will observe closely -Frequent neurologic checks  Hypokalemia: K= 3,3  on admission. - Repleted - Currently 4.0  Hypernatremic - Change fluids to D5 half-normal saline   DVT ppx: SCD Code Status: DNR Family Communication:    Yes, patient's daughter by phone Disposition Plan:  Anticipate discharge back to previous home environment Consults called:  PCCM, Dr. Spero Curb Palliative care    Procedures:    None   Antimicrobials:  Anti-infectives (From admission, onward)   Start     Dose/Rate Route Frequency Ordered Stop   03/04/17 2300  piperacillin-tazobactam (ZOSYN) IVPB 3.375 g  Status:  Discontinued     3.375 g 12.5 mL/hr over 240 Minutes Intravenous Every 8 hours 03/04/17 2244 03/06/17 1441        Objective: Vitals:   03/06/17 0811 03/06/17 0841 03/06/17 1138 03/06/17 1345  BP: 136/73 136/73 (!) 132/99   Pulse: (!) 110     Resp: (!) 44     Temp:  100.2 F (37.9 C) 98.7 F (37.1 C)   TempSrc:  Axillary Axillary   SpO2: 99% 99% 95% 98%  Weight:      Height:        Intake/Output Summary (Last 24 hours) at 03/06/2017 1513 Last data filed at 03/06/2017 1610 Gross per 24 hour  Intake 1278.75 ml  Output 525 ml  Net 753.75 ml   Filed Weights   03/04/17 2300 03/05/17 0408 03/06/17 0511  Weight: 49.6 kg (109 lb 5.6 oz) 49.6 kg (109 lb 5.6 oz) 51.5 kg (113 lb 8.6 oz)    Examination: BP (!) 132/99 (BP Location: Left Arm)   Pulse (!) 110   Temp 98.7 F (37.1 C) (Axillary)   Resp (!) 44   Ht 5\' 8"  (1.727 m)   Wt 51.5 kg (113 lb 8.6 oz)   SpO2 98%   BMI 17.26 kg/m    Physical Exam    Constitution: Unresponsive, withdraws with pain stimuli HEENT: Limited exam, eyes are closed, external reactive, NG tube in place Cardio vascular:   Irregularly irregular, tacky A. fib Chest/pulmonary: Scattered rhonchi, rales poor air entry at the lower lobes Abdomen: Soft, non-tender, non-distended,no masses, no organomegaly Muscular skeletal: Limited exam -moving all 4 extremities, hands are in mittens Neuro: Limited exam GCS of 7  Extremities: No pitting edema lower extremities, +2 pulses  Skin: Dry, warm to touch, negative for any Rashes,     Data Reviewed: I have personally reviewed following labs and imaging studies  CBC: Recent Labs  Lab 02/27/17 1850  03/02/17 0943 03/03/17 0229 03/04/17 0421 03/05/17 0935 03/06/17 0412  WBC 6.8   < > 10.6* 11.1* 13.8* 24.1* 24.8*  NEUTROABS 6.0  --   --   --   --  21.7*  --   HGB 10.0*   < > 9.2* 8.7* 8.3* 8.6* 8.7*  HCT 30.1*   < > 28.3* 26.6* 25.4* 26.0*  26.7*  MCV 80.7   < > 80.6 80.9 81.4 81.0 80.9  PLT 65*   < > 98* 87* 78* 56* 77*   < > = values in this interval not displayed.   Basic Metabolic Panel: Recent Labs  Lab 03/02/17 0943 03/03/17 0229 03/04/17 0421 03/05/17 0935 03/06/17 0412  NA 140 143 147* 152* 151*  K 3.7 3.6 3.3* 4.0 3.4*  CL 107 110 113* 121* 119*  CO2 20* 20* 21* 17* 18*  GLUCOSE 91 93 154* 93 110*  BUN 26* 22* 26* 28* 26*  CREATININE 1.12 1.15 1.26* 1.30* 1.42*  CALCIUM 8.9 8.8* 8.9 8.3* 8.2*  MG  --   --  2.2  --   --   PHOS  --   --  2.1*  --   --    GFR: Estimated Creatinine Clearance: 24.7 mL/min (A) (by C-G formula based on SCr of 1.42 mg/dL (H)). Liver Function Tests: Recent Labs  Lab 02/27/17 1850 03/05/17 0935  AST 71* 64*  ALT 18 14*  ALKPHOS 97 82  BILITOT 0.7 1.1  PROT 7.4 5.4*  ALBUMIN 3.2* 2.0*   No results for input(s): LIPASE, AMYLASE in the last 168 hours. No results for input(s): AMMONIA in the last 168 hours. Coagulation Profile: No results for input(s): INR,  PROTIME in the last 168 hours. Cardiac Enzymes: No results for input(s): CKTOTAL, CKMB, CKMBINDEX, TROPONINI in the last 168 hours. BNP (last 3 results) No results for input(s): PROBNP in the last 8760 hours. HbA1C: No results for input(s): HGBA1C in the last 72 hours. CBG: Recent Labs  Lab 03/03/17 1633 03/04/17 1636  GLUCAP 94 123*   Lipid Profile: No results for input(s): CHOL, HDL, LDLCALC, TRIG, CHOLHDL, LDLDIRECT in the last 72 hours. Thyroid Function Tests: No results for input(s): TSH, T4TOTAL, FREET4, T3FREE, THYROIDAB in the last 72 hours. Anemia Panel: No results for input(s): VITAMINB12, FOLATE, FERRITIN, TIBC, IRON, RETICCTPCT in the last 72 hours. Sepsis Labs: Recent Labs  Lab 03/04/17 2306 03/05/17 0344 03/05/17 0646 03/06/17 0944  PROCALCITON 6.97  --   --   --   LATICACIDVEN 4.1* 2.8* 3.4* 2.8*    Recent Results (from the past 240 hour(s))  Blood Culture (routine x 2)     Status: None   Collection Time: 02/27/17  8:15 PM  Result Value Ref Range Status   Specimen Description   Final    BLOOD LEFT FOREARM Performed at [facility], [address] [place], [place] [address]    Special Requests   Final    BOTTLES DRAWN AEROBIC AND ANAEROBIC Blood Culture results may not be optimal due to an inadequate volume of blood received in culture bottles Performed at [facility], [address] [place], [place] [address]    Culture   Final    NO GROWTH 5 DAYS Performed at [facility], [address] [place], [place] [address]    Report Status 03/04/2017 FINAL  Final  MRSA PCR Screening     Status: None   Collection Time: 02/27/17 11:14 PM  Result Value Ref Range Status   MRSA by PCR NEGATIVE NEGATIVE Final    Comment:        The GeneXpert MRSA Assay (FDA approved for NASAL specimens only), is one component of a comprehensive MRSA colonization surveillance program. It is not intended to diagnose  MRSA infection nor to guide or monitor treatment for MRSA infections. Performed at [facility]  [facility], [address], [place], [place] [address]   Urine culture     Status: Abnormal   Collection Time: 03/05/17  8:20 AM  Result Value Ref Range Status   Specimen Description URINE, CLEAN CATCH  Final   Special Requests   Final    NONE Performed at [facility], [address], [place], [place] [address]    Culture 70,000 COLONIES/mL YEAST (A)  Final   Report Status 03/06/2017 FINAL  Final      Radiology Studies: Dg Chest Port 1 View  Result Date: 03/04/2017 CLINICAL DATA:  Shortness of Breath EXAM: PORTABLE CHEST 1 VIEW COMPARISON:  March 02, 2017 FINDINGS: Feeding tube tip is distal to the level of the stomach. There is patchy airspace opacity in both lower lung zones as well as in the right mid lung with progression since recent study. Heart size and pulmonary vascularity are normal. No adenopathy. There is aortic atherosclerosis. No evident bone lesions. IMPRESSION: Multifocal pneumonia with areas of patchy infiltrate in the right mid lung and both lung bases. Heart size normal. There is aortic atherosclerosis. Feeding tube tip is distal to the stomach. Aortic Atherosclerosis (ICD10-I70.0). Electronically Signed   By: Bretta Bang III M.D.   On: 03/04/2017 19:42    Scheduled Meds: . chlorhexidine  15 mL Mouth Rinse BID  . cholecalciferol  1,000 Units Oral QODAY  . dorzolamide-timolol  1 drop Both Eyes BID  . latanoprost  1 drop Both Eyes QHS  . morphine CONCENTRATE  5 mg Oral Q4H  . sodium chloride flush  3 mL Intravenous Q12H   Continuous Infusions: . sodium chloride      Time spent: >40 minutes  Kendell Bane, MD [facility],  Pager [phone]  If 7PM-7AM, please contact night-coverage [email or web address]   Password [facility]  03/06/2017, 3:13 PM

## 2017-03-06 NOTE — Progress Notes (Addendum)
[facility]  Received request from CSW Darl PikesSusan for family interest [facility]. Chart reviewed and [facility] has been confirmed and room is available for Mr. Leonhardt tomorrow, 03/07/17. Left message for family requesting call back. CSW Darl PikesSusan is aware. [facility] Liaison will follow up in the morning.   Received return call from patient's daughter at 5:30. Daughter put me on speaker phone with patient's spouse present. Answered their questions and explained [facility] work [facility]. They are agreeable to meet Friday at 11:00 to complete paper work. CSW will be updated with paper work complete.    Thank you,  Forrestine Himva Davis, LCSW [phone]

## 2017-03-06 NOTE — Plan of Care (Signed)
Turn q 2 hours. Heels elevated. HR ST low 100's. Oxygen sats 98% on O2 @ 1 L via [place]. Lactic Acid improved to 2.8. Low grade temp = 100.2. IV Zosyn continued

## 2017-03-07 MED ORDER — LEVALBUTEROL HCL 1.25 MG/0.5ML IN NEBU: 1.2500 mg | INHALATION_SOLUTION | Freq: Four times a day (QID) | RESPIRATORY_TRACT | 12 refills | Status: AC | PRN

## 2017-03-07 MED ORDER — GLYCOPYRROLATE 1 MG PO TABS: 1.0000 mg | ORAL_TABLET | ORAL | 0 refills | Status: AC | PRN

## 2017-03-07 MED ORDER — LORAZEPAM 2 MG/ML PO CONC: 1.0000 mg | ORAL | 0 refills | Status: AC | PRN

## 2017-03-07 MED ORDER — POLYVINYL ALCOHOL 1.4 % OP SOLN: 1.0000 [drp] | Freq: Four times a day (QID) | OPHTHALMIC | 0 refills | Status: AC | PRN

## 2017-03-07 MED ORDER — HALOPERIDOL 0.5 MG PO TABS: 0.5000 mg | ORAL_TABLET | ORAL | 0 refills | Status: AC | PRN

## 2017-03-07 MED ORDER — MORPHINE SULFATE (CONCENTRATE) 10 MG/0.5ML PO SOLN: 5.0000 mg | ORAL | 0 refills | Status: AC | PRN

## 2017-03-07 MED ORDER — CHLORHEXIDINE GLUCONATE 0.12 % MT SOLN: 15.0000 mL | Freq: Two times a day (BID) | OROMUCOSAL | 0 refills | Status: AC

## 2017-03-07 NOTE — Progress Notes (Signed)
[facility] Liaison: ? Met with wife, Britt Boozer and 4 children to to confirm interest and explain services for [employer]. Family agreeable to transfer today. CSW aware and transportation is being arranged by Shelton Silvas. Registration paperwork completed. ? Please fax discharge summary to [phone]. ? RN please call report to [phone]. ? Thank You, Freddi Starr RN, [facility] Liaison [phone]

## 2017-03-07 NOTE — Care Management Important Message (Signed)
Important Message  Patient Details  Name: Cesar Maldonado MRN: [id number] Date of Birth: 12-20-1924   Medicare Important Message Given:       Dorena Bodoris Bettyjane Shenoy 03/07/2017, 12:10 PM

## 2017-03-07 NOTE — Plan of Care (Signed)
Pt completely nonverbal during entire shift.  Morphine 2mg  IV given for RR 35-40.  Pt does not nod/gesture. [facility].

## 2017-03-07 NOTE — Plan of Care (Signed)
  Progressing Education: Knowledge of General Education information will improve 03/07/2017 1223 - Progressing by Leyland Kenna, Arline AspMeredith P, RN Health Behavior/Discharge Planning: Ability to manage health-related needs will improve 03/07/2017 1223 - Progressing by Fabiano Ginley, Arline AspMeredith P, RN Clinical Measurements: Ability to maintain clinical measurements within normal limits will improve 03/07/2017 1223 - Progressing by Shalimar Mcclain, Arline AspMeredith P, RN Will remain free from infection 03/07/2017 1223 - Progressing by Andy Gaussunzweiler, Karalyne Nusser P, RN Diagnostic test results will improve 03/07/2017 1223 - Progressing by Andy Gaussunzweiler, Jasmeet Gehl P, RN Respiratory complications will improve 03/07/2017 1223 - Progressing by Andy Gaussunzweiler, Emmarae Cowdery P, RN Cardiovascular complication will be avoided 03/07/2017 1223 - Progressing by Andy Gaussunzweiler, Sephiroth Mcluckie P, RN Activity: Risk for activity intolerance will decrease 03/07/2017 1223 - Progressing by Andy Gaussunzweiler, Ezzie Senat P, RN Nutrition: Adequate nutrition will be maintained 03/07/2017 1223 - Progressing by Andy Gaussunzweiler, Alon Mazor P, RN Coping: Level of anxiety will decrease 03/07/2017 1223 - Progressing by Andy Gaussunzweiler, Janeice Stegall P, RN Elimination: Will not experience complications related to bowel motility 03/07/2017 1223 - Progressing by Andy Gaussunzweiler, Ellawyn Wogan P, RN Will not experience complications related to urinary retention 03/07/2017 1223 - Progressing by Braxtyn Dorff, Arline AspMeredith P, RN Pain Managment: General experience of comfort will improve 03/07/2017 1223 - Progressing by Andy Gaussunzweiler, Kyrus Hyde P, RN Safety: Ability to remain free from injury will improve 03/07/2017 1223 - Progressing by Andy Gaussunzweiler, Julita Ozbun P, RN Skin Integrity: Risk for impaired skin integrity will decrease 03/07/2017 1223 - Progressing by Andy Gaussunzweiler, Pinky Ravan P, RN Fluid Volume: Hemodynamic stability will improve 03/07/2017 1223 - Progressing by Andy Gaussunzweiler, Aylen Rambert P, RN Clinical Measurements: Diagnostic  test results will improve 03/07/2017 1223 - Progressing by Andy Gaussunzweiler, Kerigan Narvaez P, RN Signs and symptoms of infection will decrease 03/07/2017 1223 - Progressing by Andy Gaussunzweiler, Charod Slawinski P, RN Respiratory: Ability to maintain adequate ventilation will improve 03/07/2017 1223 - Progressing by Kimon Loewen, Arline AspMeredith P, RN

## 2017-03-07 NOTE — Plan of Care (Signed)
  Adequate for Discharge Education: Knowledge of General Education information will improve 03/07/2017 1623 - Adequate for Discharge by Andy Gaussunzweiler, Ramandeep Arington P, RN 03/07/2017 1223 - Progressing by Andy Gaussunzweiler, Samik Balkcom P, RN Health Behavior/Discharge Planning: Ability to manage health-related needs will improve 03/07/2017 1623 - Adequate for Discharge by Andy Gaussunzweiler, Ramanda Paules P, RN 03/07/2017 1223 - Progressing by Andy Gaussunzweiler, Arlean Thies P, RN Clinical Measurements: Ability to maintain clinical measurements within normal limits will improve 03/07/2017 1623 - Adequate for Discharge by Andy Gaussunzweiler, Bailei Buist P, RN 03/07/2017 1223 - Progressing by Andy Gaussunzweiler, Levora Werden P, RN Will remain free from infection 03/07/2017 1623 - Adequate for Discharge by Andy Gaussunzweiler, Leveon Pelzer P, RN 03/07/2017 1223 - Progressing by Andy Gaussunzweiler, Sicilia Killough P, RN Diagnostic test results will improve 03/07/2017 1623 - Adequate for Discharge by Andy Gaussunzweiler, Seri Kimmer P, RN 03/07/2017 1223 - Progressing by Andy Gaussunzweiler, Holden Draughon P, RN Respiratory complications will improve 03/07/2017 1623 - Adequate for Discharge by Andy Gaussunzweiler, Shatona Andujar P, RN 03/07/2017 1223 - Progressing by Andy Gaussunzweiler, Kameryn Tisdel P, RN Cardiovascular complication will be avoided 03/07/2017 1623 - Adequate for Discharge by Andy Gaussunzweiler, Dlynn Ranes P, RN 03/07/2017 1223 - Progressing by Andy Gaussunzweiler, Mariangel Ringley P, RN Activity: Risk for activity intolerance will decrease 03/07/2017 1623 - Adequate for Discharge by Andy Gaussunzweiler, Harbert Fitterer P, RN 03/07/2017 1223 - Progressing by Andy Gaussunzweiler, Aliene Tamura P, RN Nutrition: Adequate nutrition will be maintained 03/07/2017 1623 - Adequate for Discharge by Andy Gaussunzweiler, Chyanne Kohut P, RN 03/07/2017 1223 - Progressing by Andy Gaussunzweiler, Grettel Rames P, RN Coping: Level of anxiety will decrease 03/07/2017 1623 - Adequate for Discharge by Andy Gaussunzweiler, Lashawna Poche P, RN 03/07/2017 1223 - Progressing by Andy Gaussunzweiler, Keishon Chavarin P, RN Elimination: Will not experience complications  related to bowel motility 03/07/2017 1623 - Adequate for Discharge by Andy Gaussunzweiler, Cyara Devoto P, RN 03/07/2017 1223 - Progressing by Andy Gaussunzweiler, Sakira Dahmer P, RN Will not experience complications related to urinary retention 03/07/2017 1623 - Adequate for Discharge by Andy Gaussunzweiler, Briannie Gutierrez P, RN 03/07/2017 1223 - Progressing by Andy Gaussunzweiler, Tatia Petrucci P, RN Pain Managment: General experience of comfort will improve 03/07/2017 1623 - Adequate for Discharge by Andy Gaussunzweiler, Marsi Turvey P, RN 03/07/2017 1223 - Progressing by Andy Gaussunzweiler, Mathayus Stanbery P, RN Safety: Ability to remain free from injury will improve 03/07/2017 1623 - Adequate for Discharge by Andy Gaussunzweiler, Jarae Nemmers P, RN 03/07/2017 1223 - Progressing by Andy Gaussunzweiler, Makenna Macaluso P, RN Skin Integrity: Risk for impaired skin integrity will decrease 03/07/2017 1623 - Adequate for Discharge by Andy Gaussunzweiler, Penney Domanski P, RN 03/07/2017 1223 - Progressing by Andy Gaussunzweiler, Quinnlan Abruzzo P, RN Fluid Volume: Hemodynamic stability will improve 03/07/2017 1623 - Adequate for Discharge by Andy Gaussunzweiler, Wilmot Quevedo P, RN 03/07/2017 1223 - Progressing by Andy Gaussunzweiler, Maeli Spacek P, RN Clinical Measurements: Diagnostic test results will improve 03/07/2017 1623 - Adequate for Discharge by Andy Gaussunzweiler, Lakitha Gordy P, RN 03/07/2017 1223 - Progressing by Andy Gaussunzweiler, Charnese Federici P, RN Signs and symptoms of infection will decrease 03/07/2017 1623 - Adequate for Discharge by Andy Gaussunzweiler, Ilena Dieckman P, RN 03/07/2017 1223 - Progressing by Andy Gaussunzweiler, Madix Blowe P, RN Respiratory: Ability to maintain adequate ventilation will improve 03/07/2017 1623 - Adequate for Discharge by Andy Gaussunzweiler, Kamel Haven P, RN 03/07/2017 1223 - Progressing by Andy Gaussunzweiler, Anissia Wessells P, RN

## 2017-03-07 NOTE — Progress Notes (Signed)
Palliative Medicine RN Note: Note that [facility] will [facility]. Checked on him to ensure comfort.  No family at bedside. He has gotten prn IV morphine about 2 hours ago. Resp rate 36-40 and very labored. RN will bring prn IV morphine. Patient will not cooperate with Roxanol; he purses his lips and clenches his teeth.  [facility], I will check on him again. I provided our contact information to the RN, and she will call if the dose of prn morphine is ineffective.  Margret ChanceMelanie G. Chisom Aust, RN, BSN, [facility] Palliative Medicine Team 03/07/2017 10:41 AM Office [phone]

## 2017-03-07 NOTE — Clinical Social Work Note (Signed)
Family completed paperwork with hospice liaison. CSW will set up transport pending d/c summary. MD please sign DNR on pt's chart.  SingacBridget Ilir Mahrt, [place] [phone]

## 2017-03-07 NOTE — Care Management Note (Signed)
Case Management Note  Patient Details  Name: Cesar Maldonado MRN: [id number] Date of Birth: 06-02-1924  Subjective/Objective:  Pt presented for Respiratory Distress- from CIR. Plan will [facility] to [facility]. CSW assisting with disposition needs.                    Action/Plan: No further needs from CM at this time.   Expected Discharge Date:                  Expected Discharge Plan:  Hospice Medical Facility  In-House Referral:  Clinical Social Work  Discharge planning Services  CM Consult  Post Acute Care Choice:  NA Choice offered to:  NA  DME Arranged:  N/A DME Agency:  NA  HH Arranged:  NA HH Agency:  NA  Status of Service:  Completed, signed off  If discussed at Long Length of Stay Meetings, dates discussed:    Additional Comments:  Gala LewandowskyGraves-Bigelow, Desare Duddy Kaye, RN 03/07/2017, 9:59 AM

## 2017-03-07 NOTE — Care Management Important Message (Signed)
Important Message  Patient Details  Name: Cesar CanaryWillie E Purdum MRN: [id number] Date of Birth: 1924-10-18   Medicare Important Message Given:     Due to illness patient could not sign/unsign copy left  Wandalene Abrams 03/07/2017, 12:10 PM

## 2017-03-07 NOTE — Clinical Social Work Note (Signed)
Clinical Social Worker facilitated patient discharge including contacting patient family and facility to confirm patient discharge plans.  Clinical information faxed to facility and family agreeable with plan.  CSW arranged ambulance transport via [facility] to [facility] .  RN to call [phone] for report prior to discharge.  Clinical Social Worker will sign off for now as social work intervention is no longer needed. Please consult us again if new need arises.  Rabon Scholle, LCSWA [phone]   

## 2017-03-07 NOTE — Discharge Summary (Signed)
Physician Discharge Summary  Cesar Maldonado ZOX:[id number] DOB: 09/03/1924 DOA: 03/04/2017  PCP: Dorothyann Peng, MD  Admit date: 03/04/2017 Discharge date: 03/07/2017  Time spent: 30 minutes  Recommendations for Outpatient Follow-up:  1. [facility] palliative discharge   Discharge Diagnoses:  Principal Problem:   Aspiration pneumonia due to gastric secretions [facility]) Active Problems:   Hypertension   Dementia   Left ventricular diastolic dysfunction, NYHA class 1   Acute respiratory failure with hypoxia (HCC)   CKD (chronic kidney disease), stage III (HCC)   Protein-calorie malnutrition, severe (HCC)   Basal ganglia hemorrhage (HCC)   ICH (intracerebral hemorrhage) (HCC)   Aspiration pneumonia (HCC)   Sepsis (HCC)   Comfort measures only status   Discharge Condition: guarded  Diet recommendation: none  Filed Weights   03/04/17 2300 03/05/17 0408 03/06/17 0511  Weight: 49.6 kg (109 lb 5.6 oz) 49.6 kg (109 lb 5.6 oz) 51.5 kg (113 lb 8.6 oz)    History of present illness:  Cesar Maldonado is a 84 y.o. male with medical history significant of hypertension, dCHF, dementia, BPH, CKD-2, recent admission for right basal ganglia/thalamus hematoma and SAH, currrently doing inpt rehab, who was found to have acute respiratory distress. We are called to consult on this pt.  Pt was recently admitted to neurology ICU due to right basal ganglia/thalamus hematoma from a 2/14-12/19. Patient is stabilized and transferred to inpatient rehabilitation unit today (2/19). Pt is being treated for aspiration pneumonia with IV Zosyn currently.Per nurse report, pt vomited and possibly aspirated the emesis at about 5:00 PM, leading to acute respiratory distress. He tachypnea with RR upto 50s and tachycardia with HR up to 150s. His oxygen saturation decreased to 86% of the report. Stat ABG showed pH 7.498, PCO2 26, PO2 of 60.9. Pt has dementia. Per nurse, patient has very minimal unresponsive ever since his  recent CVA. He moves his extremities slightly. His temperature is 99.1. PCCM was consulted. Dr. Clair Gulling spoke to family and code status changed to DNR.     Hospital Course:  Admitted to the stepdown unit with sepsis secondary to aspiration pneumonia.  Was started on IV antibiotics.  Patient did not progress well on sepsis protocol.  Poor prognosis related to family he was agreeable to palliative care.  Patient made DNR/DNI.  Palliative care consulted and discharge to [facility] organized.  Procedures:  none  Consultations:  Palliative  Discharge Exam: Vitals:   03/07/17 0734 03/07/17 1153  BP: 125/62 (!) 108/59  Pulse: (!) 130 (!) 120  Resp:    Temp: 98.6 F (37 C) 97.6 F (36.4 C)  SpO2: 96% 90%    General: NAD, NCAT Cardiovascular: No facial edema Respiratory: Tachypnea  Discharge Instructions   Discharge Instructions    Call MD for:  difficulty breathing, headache or visual disturbances   Complete by:  As directed    Call MD for:  persistant nausea and vomiting   Complete by:  As directed    Call MD for:  severe uncontrolled pain   Complete by:  As directed      Allergies as of 03/07/2017   No Known Allergies     Medication List    STOP taking these medications   acidophilus Caps capsule   CARTIA XT 120 MG 24 hr capsule Generic drug:  diltiazem   cholecalciferol 1000 units tablet Commonly known as:  VITAMIN D   dorzolamide-timolol 22.3-6.8 MG/ML ophthalmic solution Commonly known as:  COSOPT   lactose free nutrition  Liqd   latanoprost 0.005 % ophthalmic solution Commonly known as:  XALATAN     TAKE these medications   CENTRUM PO Take 1 tablet by mouth every other day.   chlorhexidine 0.12 % solution Commonly known as:  PERIDEX 15 mLs by Mouth Rinse route 2 (two) times daily.   glycopyrrolate 1 MG tablet Commonly known as:  ROBINUL Take 1 tablet (1 mg total) by mouth every 4 (four) hours as needed for up to 21 days (excessive  secretions).   haloperidol 0.5 MG tablet Commonly known as:  HALDOL Take 1 tablet (0.5 mg total) by mouth every 4 (four) hours as needed for agitation (or delirium).   levalbuterol 1.25 MG/0.5ML nebulizer solution Commonly known as:  XOPENEX Take 1.25 mg by nebulization every 6 (six) hours as needed for wheezing or shortness of breath.   LORazepam 2 MG/ML concentrated solution Commonly known as:  ATIVAN Place 0.5 mLs (1 mg total) under the tongue every 4 (four) hours as needed for anxiety.   morphine CONCENTRATE 10 MG/0.5ML Soln concentrated solution Place 0.25 mLs (5 mg total) under the tongue every 2 (two) hours as needed for moderate pain (or dyspnea).   polyvinyl alcohol 1.4 % ophthalmic solution Commonly known as:  LIQUIFILM TEARS Place 1 drop into both eyes 4 (four) times daily as needed for dry eyes.      No Known Allergies    The results of significant diagnostics from this hospitalization (including imaging, microbiology, ancillary and laboratory) are listed below for reference.    Significant Diagnostic Studies: Ct Angio Head W Or Wo Contrast  Result Date: 02/28/2017 CLINICAL DATA:  Follow-up intracranial hemorrhage. History of Alzheimer's disease. EXAM: CT ANGIOGRAPHY HEAD TECHNIQUE: Multidetector CT imaging of the head was performed using the standard protocol during bolus administration of intravenous contrast. Multiplanar CT image reconstructions and MIPs were obtained to evaluate the vascular anatomy. CONTRAST:  50mL ISOVUE-370 IOPAMIDOL (ISOVUE-370) INJECTION 76% COMPARISON:  CT HEAD February 27, 2017 at 1944 hours FINDINGS: CT HEAD BRAIN: Stable 3.3 x 2.4 x 2.7 cm RIGHT basal ganglia to thalamus intraparenchymal hemorrhage similar local mass effect with 5 mm RIGHT to LEFT midline shift. Intraventricular extension with layering blood products in occipital horns. Severe parenchymal brain volume loss most conspicuous within temporal lobes without hydrocephalus. Confluent  supratentorial white matter hypodensities. No acute large vascular territory infarct. No abnormal extra-axial fluid collections. VASCULAR: Mild calcific atherosclerosis of the carotid siphons. SKULL: No skull fracture. No significant scalp soft tissue swelling. Frontal scalp scarring. SINUSES/ORBITS: Minimal frothy secretions RIGHT posterior ethmoid air cells. Subcentimeter LEFT maxillary mucosal retention. Trace LEFT mastoid effusion. The included ocular globes and orbital contents are non-suspicious. Status post bilateral ocular lens implants. OTHER: None. CTA HEAD ANTERIOR CIRCULATION: Patent cervical internal carotid arteries, petrous, cavernous and supra clinoid internal carotid arteries. Patent anterior communicating artery. Patent anterior and middle cerebral arteries. Tandem severe stenosis bilateral ACA. Severe stenosis proximal M2 segment. No large vessel occlusion, contrast extravasation or aneurysm. POSTERIOR CIRCULATION: Patent vertebral arteries, vertebrobasilar junction and basilar artery, as well as main branch vessels. Patent posterior cerebral arteries. Moderate tandem stenoses bilateral posterior cerebral arteries. Symmetric severe stenosis bilateral P3 segments. No large vessel occlusion, contrast extravasation or aneurysm. VENOUS SINUSES: Major dural venous sinuses are patent though not tailored for evaluation on this angiographic examination. ANATOMIC VARIANTS: None. DELAYED PHASE: Not performed. MIP images reviewed. IMPRESSION: CT HEAD: 1. Stable evolving acute RIGHT basal ganglia/thalamus hematoma with intraventricular extension. No hydrocephalus. 2. Stable regional mass effect  with 5 mm RIGHT to LEFT midline shift. 3. Stable severe atrophy and chronic small vessel ischemic disease. CTA HEAD: 1. No emergent large vessel occlusion, no aneurysm. 2. Intracranial atherosclerosis resulting in multifocal severe stenosis anterior and posterior circulation. Electronically Signed   By: Awilda Metroourtnay   Bloomer M.D.   On: 02/28/2017 01:41   Dg Chest 2 View  Result Date: 02/27/2017 CLINICAL DATA:  Cough and shortness of breath. EXAM: CHEST  2 VIEW COMPARISON:  02/23/2017 FINDINGS: The cardiac silhouette, mediastinal and hilar contours are within normal limits and stable. There is mild tortuosity of the thoracic aorta. Chronic underlying emphysema. New bilateral pleural effusions and left lower lobe infiltrate IMPRESSION: Left lower lobe pneumonia and bilateral pleural effusions. Electronically Signed   By: Rudie MeyerP.  Gallerani M.D.   On: 02/27/2017 19:34   Dg Chest 2 View  Result Date: 02/23/2017 CLINICAL DATA:  Weakness EXAM: CHEST  2 VIEW COMPARISON:  04/17/2016 FINDINGS: There is hyperinflation of the lungs compatible with COPD. Small bilateral effusions on the lateral view. No confluent airspace opacities. Heart is normal size. : Small bilateral pleural effusions.  COPD. Electronically Signed   By: Charlett NoseKevin  Dover M.D.   On: 02/23/2017 13:01   Dg Abd 1 View  Result Date: 02/17/2017 CLINICAL DATA:  Periumbilical abdominal pain and cramping for the past 2 weeks. EXAM: ABDOMEN - 1 VIEW COMPARISON:  None. FINDINGS: The bowel gas pattern is normal. No radio-opaque calculi or other significant radiographic abnormality are seen. IMPRESSION: Negative. Electronically Signed   By: Obie DredgeWilliam T Derry M.D.   On: 02/17/2017 13:30   Ct Head Wo Contrast  Result Date: 02/27/2017 CLINICAL DATA:  Head trauma, moderate to severe, low G CS. Fall on Wednesday evening with altered mental status. EXAM: CT HEAD WITHOUT CONTRAST TECHNIQUE: Contiguous axial images were obtained from the base of the skull through the vertex without intravenous contrast. COMPARISON:  02/23/2017 FINDINGS: Brain: 3.3 x 2.4 x 2.5 cm (~20cc) hematoma centered in the right basal ganglia, centered at the genu of the internal capsule and contacting the putamen, caudate head, anterior thalamus. There is intraventricular extension with layering hazy clot in the  occipital horns. There is ventriculomegaly, but stable from prior and attributed to advanced brain atrophy. Remote lacunar infarct in the left caudate head. No evidence of cortical infarct. Hazy low-density in the periventricular white matter. Vascular: Atherosclerotic calcification. Skull: No acute or aggressive finding. Sinuses/Orbits: Bilateral cataract resection. Other: Critical Value/emergent results were called by telephone at the time of interpretation on 02/27/2017 at 8:02 pm to Dr. Charlestine NightHRISTOPHER LAWYER , who verbally acknowledged these results. IMPRESSION: 1. ~20 cc hematoma centered in the right basal ganglia and internal capsule. There is intraventricular hemorrhage with blood layering in the occipital horns of lateral ventricles. 2. Severe brain atrophy with chronic small vessel ischemia. Electronically Signed   By: Marnee SpringJonathon  Watts M.D.   On: 02/27/2017 20:02   Ct Head Wo Contrast  Result Date: 02/23/2017 CLINICAL DATA:  84 year old male with altered level of consciousness. EXAM: CT HEAD WITHOUT CONTRAST TECHNIQUE: Contiguous axial images were obtained from the base of the skull through the vertex without intravenous contrast. COMPARISON:  07/23/2015 MR and prior studies FINDINGS: Brain: No evidence of acute infarction, hemorrhage, hydrocephalus, extra-axial collection or mass lesion/mass effect. Moderate to severe atrophy, chronic small-vessel white matter ischemic changes and remote left basal ganglia infarct are again noted. Vascular: Intracranial atherosclerotic calcifications identified. Skull: Normal. Negative for fracture or focal lesion. Sinuses/Orbits: No acute finding. Other: None IMPRESSION: 1.  No evidence of acute intracranial abnormality 2. Atrophy, chronic small-vessel white matter ischemic changes and remote left basal ganglia infarct. Electronically Signed   By: Harmon Pier M.D.   On: 02/23/2017 14:16   Mr Brain Wo Contrast  Result Date: 03/01/2017 CLINICAL DATA:  Follow-up  intracranial hemorrhage. EXAM: MRI HEAD WITHOUT CONTRAST TECHNIQUE: Multiplanar, multiecho pulse sequences of the brain and surrounding structures were obtained without intravenous contrast. COMPARISON:  CT HEAD February 29, 2016 FINDINGS: Multiple sequences are moderately motion degraded. INTRACRANIAL CONTENTS: No reduced diffusion to suggest acute ischemia. Spurious reduced diffusion about the occipital horns and, basilar subarachnoid space compatible with blood products, associated susceptibility artifact. Susceptibility artifact associated with the RIGHT basal ganglia to thalamus hematoma with surrounding T2 bright vasogenic edema extending to RIGHT midbrain. Similar regional mass effect and 4 mm RIGHT to LEFT midline shift. Severe parenchymal brain volume loss without hydrocephalus. Layering blood products within the occipital horns as seen on prior CT. Patchy to confluent supratentorial and pontine white matter FLAIR T2 hyperintensities. Prominent basal ganglia T2 hyperintensities on the LEFT compatible with chronic small vessel ischemic disease. Old LEFT basal ganglia lacunar infarct. No abnormal extra-axial fluid collections. VASCULAR: Normal major intracranial vascular flow voids present at skull base. SKULL AND UPPER CERVICAL SPINE: No abnormal sellar expansion. Heterogeneous bone marrow signal. Severe RIGHT cervical facet arthropathy. Craniocervical junction maintained. SINUSES/ORBITS: Minimal LEFT mastoid effusion. The included ocular globes and orbital contents are non-suspicious. Status post bilateral ocular lens implants. OTHER: None. IMPRESSION: 1. Moderately motion degraded examination. 2. Evolving acute RIGHT basal ganglia/thalamus hematoma with similar mass effect, 4 mm RIGHT to LEFT midline shift without ventricular entrapment. 3. Small volume redistributed subarachnoid hemorrhage. Similar layering blood products occipital horns. No hydrocephalus. 4. Severe parenchymal brain volume loss and  chronic small vessel ischemic disease. Old LEFT basal ganglia lacunar infarct. Electronically Signed   By: Awilda Metro M.D.   On: 03/01/2017 00:28   Dg Chest Port 1 View  Result Date: 03/04/2017 CLINICAL DATA:  Shortness of Breath EXAM: PORTABLE CHEST 1 VIEW COMPARISON:  March 02, 2017 FINDINGS: Feeding tube tip is distal to the level of the stomach. There is patchy airspace opacity in both lower lung zones as well as in the right mid lung with progression since recent study. Heart size and pulmonary vascularity are normal. No adenopathy. There is aortic atherosclerosis. No evident bone lesions. IMPRESSION: Multifocal pneumonia with areas of patchy infiltrate in the right mid lung and both lung bases. Heart size normal. There is aortic atherosclerosis. Feeding tube tip is distal to the stomach. Aortic Atherosclerosis (ICD10-I70.0). Electronically Signed   By: Bretta Bang III M.D.   On: 03/04/2017 19:42   Dg Chest Port 1 View  Result Date: 03/02/2017 CLINICAL DATA:  Pneumonia. EXAM: PORTABLE CHEST 1 VIEW COMPARISON:  Chest x-ray dated February 27, 2017. FINDINGS: The heart size and mediastinal contours are within normal limits. Normal pulmonary vascularity. The lungs remain hyperinflated. Relatively unchanged reticulonodular interstitial and patchy alveolar opacities in the left lower lobe. No large pleural effusion. No pneumothorax. No acute osseous abnormality. IMPRESSION: Relatively unchanged left lower lobe infiltrate. Electronically Signed   By: Obie Dredge M.D.   On: 03/02/2017 09:37    Microbiology: Recent Results (from the past 240 hour(s))  Blood Culture (routine x 2)     Status: None   Collection Time: 02/27/17  8:15 PM  Result Value Ref Range Status   Specimen Description   Final    BLOOD LEFT FOREARM Performed  at [facility], [address] [place], [place] [address]    Special Requests   Final    BOTTLES DRAWN AEROBIC AND ANAEROBIC Blood  Culture results may not be optimal due to an inadequate volume of blood received in culture bottles Performed at [facility], [address] [place], [place] [address]    Culture   Final    NO GROWTH 5 DAYS Performed at [facility], [address], [place], [place] [address]    Report Status 03/04/2017 FINAL  Final  MRSA PCR Screening     Status: None   Collection Time: 02/27/17 11:14 PM  Result Value Ref Range Status   MRSA by PCR NEGATIVE NEGATIVE Final    Comment:        The GeneXpert MRSA Assay (FDA approved for NASAL specimens only), is one component of a comprehensive MRSA colonization surveillance program. It is not intended to diagnose MRSA infection nor to guide or monitor treatment for MRSA infections. Performed at [facility], [address], [place], [place] [address]   Culture, blood (routine x 2) Call MD if unable to obtain prior to antibiotics being given     Status: None (Preliminary result)   Collection Time: 03/04/17 11:22 PM  Result Value Ref Range Status   Specimen Description BLOOD RIGHT ANTECUBITAL  Final   Special Requests   Final    BOTTLES DRAWN AEROBIC AND ANAEROBIC Blood Culture adequate volume   Culture   Final    NO GROWTH 1 DAY Performed at [facility], [address], [place], [place] [address]    Report Status PENDING  Incomplete  Culture, blood (routine x 2) Call MD if unable to obtain prior to antibiotics being given     Status: None (Preliminary result)   Collection Time: 03/04/17 11:31 PM  Result Value Ref Range Status   Specimen Description BLOOD RIGHT HAND  Final   Special Requests IN PEDIATRIC BOTTLE Blood Culture adequate volume  Final   Culture   Final    NO GROWTH 1 DAY Performed at [facility], [address], [place], [place] [address]    Report Status PENDING  Incomplete  Urine culture     Status: Abnormal   Collection Time: 03/05/17  8:20 AM  Result Value Ref Range Status    Specimen Description URINE, CLEAN CATCH  Final   Special Requests   Final    NONE Performed at [facility], [address], [place], [place] [address]    Culture 70,000 COLONIES/mL YEAST (A)  Final   Report Status 03/06/2017 FINAL  Final     Labs: Basic Metabolic Panel: Recent Labs  Lab 03/02/17 0943 03/03/17 0229 03/04/17 0421 03/05/17 0935 03/06/17 0412  NA 140 143 147* 152* 151*  K 3.7 3.6 3.3* 4.0 3.4*  CL 107 110 113* 121* 119*  CO2 20* 20* 21* 17* 18*  GLUCOSE 91 93 154* 93 110*  BUN 26* 22* 26* 28* 26*  CREATININE 1.12 1.15 1.26* 1.30* 1.42*  CALCIUM 8.9 8.8* 8.9 8.3* 8.2*  MG  --   --  2.2  --   --   PHOS  --   --  2.1*  --   --    Liver Function Tests: Recent Labs  Lab 03/05/17 0935  AST 64*  ALT 14*  ALKPHOS 82  BILITOT 1.1  PROT 5.4*  ALBUMIN 2.0*   No results for  input(s): LIPASE, AMYLASE in the last 168 hours. No results for input(s): AMMONIA in the last 168 hours. CBC: Recent Labs  Lab 03/02/17 0943 03/03/17 0229 03/04/17 0421 03/05/17 0935 03/06/17 0412  WBC 10.6* 11.1* 13.8* 24.1* 24.8*  NEUTROABS  --   --   --  21.7*  --   HGB 9.2* 8.7* 8.3* 8.6* 8.7*  HCT 28.3* 26.6* 25.4* 26.0* 26.7*  MCV 80.6 80.9 81.4 81.0 80.9  PLT 98* 87* 78* 56* 77*   Cardiac Enzymes: No results for input(s): CKTOTAL, CKMB, CKMBINDEX, TROPONINI in the last 168 hours. BNP: BNP (last 3 results) Recent Labs    03/04/17 2306  BNP 108.3*    ProBNP (last 3 results) No results for input(s): PROBNP in the last 8760 hours.  CBG: Recent Labs  Lab 03/03/17 1633 03/04/17 1636  GLUCAP 94 123*       Signed:  Haydee Salter MD  FACP  [facility] 03/07/2017, 1:32 PM

## 2017-03-10 LAB — CULTURE, BLOOD (ROUTINE X 2)
Culture: NO GROWTH
Culture: NO GROWTH
SPECIAL REQUESTS: ADEQUATE
Special Requests: ADEQUATE

## 2017-03-14 DEATH — deceased
